# Patient Record
Sex: Female | Born: 1945 | Race: White | Hispanic: No | Smoking: Former smoker
Health system: Southern US, Community
[De-identification: ages and names within clinical notes are randomized; demographics above are authoritative.]

## PROBLEM LIST (undated history)

## (undated) DIAGNOSIS — E559 Vitamin D deficiency, unspecified: Secondary | ICD-10-CM

## (undated) DIAGNOSIS — H269 Unspecified cataract: Secondary | ICD-10-CM

## (undated) DIAGNOSIS — K219 Gastro-esophageal reflux disease without esophagitis: Secondary | ICD-10-CM

## (undated) DIAGNOSIS — T7840XA Allergy, unspecified, initial encounter: Secondary | ICD-10-CM

## (undated) DIAGNOSIS — M1990 Unspecified osteoarthritis, unspecified site: Secondary | ICD-10-CM

## (undated) DIAGNOSIS — D219 Benign neoplasm of connective and other soft tissue, unspecified: Secondary | ICD-10-CM

## (undated) DIAGNOSIS — H409 Unspecified glaucoma: Secondary | ICD-10-CM

## (undated) DIAGNOSIS — Z974 Presence of external hearing-aid: Secondary | ICD-10-CM

## (undated) DIAGNOSIS — F419 Anxiety disorder, unspecified: Secondary | ICD-10-CM

## (undated) DIAGNOSIS — E785 Hyperlipidemia, unspecified: Secondary | ICD-10-CM

## (undated) DIAGNOSIS — N6320 Unspecified lump in the left breast, unspecified quadrant: Secondary | ICD-10-CM

## (undated) HISTORY — DX: Presence of external hearing-aid: Z97.4

## (undated) HISTORY — DX: Hyperlipidemia, unspecified: E78.5

## (undated) HISTORY — DX: Gastro-esophageal reflux disease without esophagitis: K21.9

## (undated) HISTORY — DX: Anxiety disorder, unspecified: F41.9

## (undated) HISTORY — DX: Allergy, unspecified, initial encounter: T78.40XA

## (undated) HISTORY — DX: Vitamin D deficiency, unspecified: E55.9

## (undated) HISTORY — PX: ABDOMINAL HYSTERECTOMY: SHX81

## (undated) HISTORY — PX: TOTAL ABDOMINAL HYSTERECTOMY W/ BILATERAL SALPINGOOPHORECTOMY: SHX83

## (undated) HISTORY — PX: COSMETIC SURGERY: SHX468

## (undated) HISTORY — DX: Unspecified cataract: H26.9

## (undated) HISTORY — DX: Unspecified glaucoma: H40.9

## (undated) HISTORY — PX: TRIGGER FINGER RELEASE: SHX641

## (undated) HISTORY — DX: Unspecified lump in the left breast, unspecified quadrant: N63.20

## (undated) HISTORY — DX: Unspecified osteoarthritis, unspecified site: M19.90

## (undated) HISTORY — DX: Benign neoplasm of connective and other soft tissue, unspecified: D21.9

## (undated) HISTORY — PX: CATARACT EXTRACTION W/ INTRAOCULAR LENS IMPLANT: SHX1309

---

## 1970-10-04 HISTORY — PX: TONSILLECTOMY AND ADENOIDECTOMY: SUR1326

## 1985-10-04 DIAGNOSIS — D219 Benign neoplasm of connective and other soft tissue, unspecified: Secondary | ICD-10-CM

## 1985-10-04 HISTORY — DX: Benign neoplasm of connective and other soft tissue, unspecified: D21.9

## 1987-10-05 HISTORY — PX: TOTAL ABDOMINAL HYSTERECTOMY W/ BILATERAL SALPINGOOPHORECTOMY: SHX83

## 1995-10-05 HISTORY — PX: STOMACH SURGERY: SHX791

## 2003-10-05 DIAGNOSIS — N6320 Unspecified lump in the left breast, unspecified quadrant: Secondary | ICD-10-CM

## 2003-10-05 HISTORY — DX: Unspecified lump in the left breast, unspecified quadrant: N63.20

## 2003-11-05 ENCOUNTER — Encounter: Admission: RE | Admit: 2003-11-05 | Discharge: 2003-11-05 | Payer: Self-pay | Admitting: Obstetrics and Gynecology

## 2004-12-10 ENCOUNTER — Encounter: Admission: RE | Admit: 2004-12-10 | Discharge: 2004-12-10 | Payer: Self-pay | Admitting: *Deleted

## 2005-12-27 ENCOUNTER — Encounter: Admission: RE | Admit: 2005-12-27 | Discharge: 2005-12-27 | Payer: Self-pay | Admitting: Obstetrics and Gynecology

## 2006-02-15 ENCOUNTER — Ambulatory Visit: Payer: Self-pay | Admitting: Internal Medicine

## 2006-02-16 ENCOUNTER — Ambulatory Visit: Payer: Self-pay | Admitting: Internal Medicine

## 2006-05-19 ENCOUNTER — Emergency Department (HOSPITAL_COMMUNITY): Admission: EM | Admit: 2006-05-19 | Discharge: 2006-05-20 | Payer: Self-pay | Admitting: Emergency Medicine

## 2006-10-24 ENCOUNTER — Ambulatory Visit: Payer: Self-pay | Admitting: Internal Medicine

## 2006-12-29 ENCOUNTER — Encounter: Admission: RE | Admit: 2006-12-29 | Discharge: 2006-12-29 | Payer: Self-pay | Admitting: Obstetrics and Gynecology

## 2007-02-20 ENCOUNTER — Ambulatory Visit: Payer: Self-pay | Admitting: Internal Medicine

## 2007-07-24 ENCOUNTER — Telehealth: Payer: Self-pay | Admitting: Internal Medicine

## 2007-08-16 ENCOUNTER — Ambulatory Visit: Payer: Self-pay | Admitting: Internal Medicine

## 2007-08-16 DIAGNOSIS — H6120 Impacted cerumen, unspecified ear: Secondary | ICD-10-CM | POA: Insufficient documentation

## 2007-10-05 HISTORY — PX: LIPOMA EXCISION: SHX5283

## 2008-01-01 ENCOUNTER — Encounter: Admission: RE | Admit: 2008-01-01 | Discharge: 2008-01-01 | Payer: Self-pay | Admitting: Obstetrics & Gynecology

## 2008-04-16 ENCOUNTER — Telehealth: Payer: Self-pay | Admitting: Internal Medicine

## 2008-05-23 ENCOUNTER — Encounter: Payer: Self-pay | Admitting: Internal Medicine

## 2008-05-30 ENCOUNTER — Encounter: Admission: RE | Admit: 2008-05-30 | Discharge: 2008-05-30 | Payer: Self-pay | Admitting: Obstetrics & Gynecology

## 2008-10-24 ENCOUNTER — Telehealth: Payer: Self-pay | Admitting: Internal Medicine

## 2008-10-24 ENCOUNTER — Ambulatory Visit: Payer: Self-pay | Admitting: Internal Medicine

## 2008-10-24 DIAGNOSIS — F411 Generalized anxiety disorder: Secondary | ICD-10-CM | POA: Insufficient documentation

## 2008-10-24 DIAGNOSIS — E782 Mixed hyperlipidemia: Secondary | ICD-10-CM | POA: Insufficient documentation

## 2008-10-24 DIAGNOSIS — E785 Hyperlipidemia, unspecified: Secondary | ICD-10-CM | POA: Insufficient documentation

## 2008-10-24 LAB — CONVERTED CEMR LAB
Basophils Absolute: 0 10*3/uL (ref 0.0–0.1)
Bilirubin, Direct: 0.1 mg/dL (ref 0.0–0.3)
Calcium: 9.2 mg/dL (ref 8.4–10.5)
Cholesterol: 245 mg/dL (ref 0–200)
Eosinophils Absolute: 0.1 10*3/uL (ref 0.0–0.7)
GFR calc Af Amer: 93 mL/min
GFR calc non Af Amer: 77 mL/min
Glucose, Bld: 81 mg/dL (ref 70–99)
HCT: 38.6 % (ref 36.0–46.0)
Hemoglobin: 13.4 g/dL (ref 12.0–15.0)
MCHC: 34.8 g/dL (ref 30.0–36.0)
MCV: 92.9 fL (ref 78.0–100.0)
Monocytes Absolute: 0.3 10*3/uL (ref 0.1–1.0)
Monocytes Relative: 6.4 % (ref 3.0–12.0)
Neutro Abs: 2.6 10*3/uL (ref 1.4–7.7)
Platelets: 261 10*3/uL (ref 150–400)
Potassium: 4 meq/L (ref 3.5–5.1)
RDW: 11.8 % (ref 11.5–14.6)
Sodium: 145 meq/L (ref 135–145)
TSH: 2.11 microintl units/mL (ref 0.35–5.50)
Total Protein: 6.6 g/dL (ref 6.0–8.3)
Triglycerides: 100 mg/dL (ref 0–149)

## 2008-11-07 ENCOUNTER — Ambulatory Visit: Payer: Self-pay | Admitting: Internal Medicine

## 2008-12-17 ENCOUNTER — Ambulatory Visit: Payer: Self-pay | Admitting: Internal Medicine

## 2008-12-17 DIAGNOSIS — M25519 Pain in unspecified shoulder: Secondary | ICD-10-CM | POA: Insufficient documentation

## 2009-01-02 ENCOUNTER — Encounter: Admission: RE | Admit: 2009-01-02 | Discharge: 2009-01-02 | Payer: Self-pay | Admitting: Obstetrics & Gynecology

## 2009-04-14 ENCOUNTER — Encounter: Payer: Self-pay | Admitting: Internal Medicine

## 2009-06-04 ENCOUNTER — Ambulatory Visit: Payer: Self-pay | Admitting: Internal Medicine

## 2009-06-17 ENCOUNTER — Ambulatory Visit: Payer: Self-pay | Admitting: Internal Medicine

## 2010-01-05 ENCOUNTER — Encounter: Admission: RE | Admit: 2010-01-05 | Discharge: 2010-01-05 | Payer: Self-pay | Admitting: Obstetrics & Gynecology

## 2010-06-23 ENCOUNTER — Encounter: Admission: RE | Admit: 2010-06-23 | Discharge: 2010-06-23 | Payer: Self-pay | Admitting: Obstetrics & Gynecology

## 2010-07-13 ENCOUNTER — Encounter: Admission: RE | Admit: 2010-07-13 | Discharge: 2010-07-13 | Payer: Self-pay | Admitting: Internal Medicine

## 2011-03-04 ENCOUNTER — Other Ambulatory Visit: Payer: Self-pay | Admitting: Obstetrics & Gynecology

## 2011-03-04 DIAGNOSIS — Z1231 Encounter for screening mammogram for malignant neoplasm of breast: Secondary | ICD-10-CM

## 2011-03-08 ENCOUNTER — Ambulatory Visit
Admission: RE | Admit: 2011-03-08 | Discharge: 2011-03-08 | Disposition: A | Discharge status: Home / Self Care | Payer: Medicare Other | Source: Ambulatory Visit | Attending: Obstetrics & Gynecology | Admitting: Obstetrics & Gynecology

## 2011-03-08 DIAGNOSIS — Z1231 Encounter for screening mammogram for malignant neoplasm of breast: Secondary | ICD-10-CM

## 2012-03-03 ENCOUNTER — Other Ambulatory Visit: Payer: Self-pay | Admitting: Obstetrics & Gynecology

## 2012-03-03 DIAGNOSIS — Z1231 Encounter for screening mammogram for malignant neoplasm of breast: Secondary | ICD-10-CM

## 2012-03-13 ENCOUNTER — Ambulatory Visit
Admission: RE | Admit: 2012-03-13 | Discharge: 2012-03-13 | Disposition: A | Discharge status: Home / Self Care | Payer: Medicare Other | Source: Ambulatory Visit | Attending: Obstetrics & Gynecology | Admitting: Obstetrics & Gynecology

## 2012-03-13 DIAGNOSIS — Z1231 Encounter for screening mammogram for malignant neoplasm of breast: Secondary | ICD-10-CM

## 2012-06-04 HISTORY — PX: EYE SURGERY: SHX253

## 2013-02-14 ENCOUNTER — Other Ambulatory Visit: Payer: Self-pay

## 2013-02-14 DIAGNOSIS — Z1231 Encounter for screening mammogram for malignant neoplasm of breast: Secondary | ICD-10-CM

## 2013-03-20 ENCOUNTER — Ambulatory Visit
Admission: RE | Admit: 2013-03-20 | Discharge: 2013-03-20 | Disposition: A | Discharge status: Home / Self Care | Payer: Medicare Other | Source: Ambulatory Visit

## 2013-03-20 DIAGNOSIS — Z1231 Encounter for screening mammogram for malignant neoplasm of breast: Secondary | ICD-10-CM

## 2013-06-29 ENCOUNTER — Encounter: Payer: Self-pay | Admitting: Obstetrics & Gynecology

## 2013-11-28 ENCOUNTER — Encounter: Payer: Self-pay | Admitting: Obstetrics & Gynecology

## 2013-11-30 ENCOUNTER — Ambulatory Visit: Payer: Self-pay | Admitting: Obstetrics & Gynecology

## 2013-12-07 ENCOUNTER — Ambulatory Visit: Payer: Self-pay | Admitting: Obstetrics & Gynecology

## 2013-12-20 ENCOUNTER — Encounter: Payer: Self-pay | Admitting: Obstetrics & Gynecology

## 2013-12-20 ENCOUNTER — Ambulatory Visit (INDEPENDENT_AMBULATORY_CARE_PROVIDER_SITE_OTHER): Payer: Medicare Other | Admitting: Obstetrics & Gynecology

## 2013-12-20 ENCOUNTER — Other Ambulatory Visit: Payer: Self-pay | Admitting: Obstetrics & Gynecology

## 2013-12-20 VITALS — BP 140/72 | HR 80 | Resp 16 | Ht 64.5 in | Wt 145.8 lb

## 2013-12-20 DIAGNOSIS — Z1239 Encounter for other screening for malignant neoplasm of breast: Secondary | ICD-10-CM

## 2013-12-20 DIAGNOSIS — Z01419 Encounter for gynecological examination (general) (routine) without abnormal findings: Secondary | ICD-10-CM

## 2013-12-20 DIAGNOSIS — E2839 Other primary ovarian failure: Secondary | ICD-10-CM

## 2013-12-20 DIAGNOSIS — Z1231 Encounter for screening mammogram for malignant neoplasm of breast: Secondary | ICD-10-CM

## 2013-12-20 MED ORDER — METOPROLOL SUCCINATE ER 25 MG PO TB24: ORAL_TABLET | ORAL | Status: DC

## 2013-12-20 MED ORDER — PAROXETINE HCL 40 MG PO TABS: 40.0000 mg | ORAL_TABLET | Freq: Every day | ORAL | Status: DC

## 2013-12-20 NOTE — Progress Notes (Signed)
68 y.o. G0P0000 DivorcedCaucasianF here for annual exam.  No vaginal bleeding.  Having a little issues with fullness in her ears.  Going to try some Zyrtec first.  She thinks it is allergy related.  Was started on Metoprolol for increased rate.  Felt really good on it but was afraid this was something bad so she stopped it and didn't call Dr. Baird Cancer to let her know.  Will need RFs.  I recommend she restart this.  Patient's last menstrual period was 10/04/1984.          Sexually active: no  The current method of family planning is status post hysterectomy.    Exercising: yes  walking Smoker:  Former smoker-quit in [place] Maintenance: Pap:  ? 2004 History of abnormal Pap:  no MMG:  03/20/13 normal Colonoscopy:  2010 repeat in 5 years BMD:   2009 TDaP:  Pt thinks UTD with Dr Glendale Chard Screening Labs: PCP, Hb today: PCP, Urine today: PCP   reports that she has quit smoking. She has never used smokeless tobacco. She reports that she does not drink alcohol or use illicit drugs.  Past Medical History  Diagnosis Date  . Breast mass, left 2005    fatty on U/S  . Anxiety     Chronic  . Arthritis   . Fibroid 1987    TAH/USO  . Hyperlipidemia     Past Surgical History  Procedure Laterality Date  . Tonsillectomy and adenoidectomy  1972  . Abdominal hysterectomy  1987    TAH/USO Fibroids, Cyst  . Cosmetic surgery      Mini face lift  . Stomach surgery  1997    Sphincter  . Lipoma excision Left 2009    left arm  . Eye surgery  06/2012    eye lift  . Trigger finger release      right    Current Outpatient Prescriptions  Medication Sig Dispense Refill  . atorvastatin (LIPITOR) 20 MG tablet       . BIOTIN PO Take 1,000 mg by mouth.      . Cholecalciferol (VITAMIN D PO) Take 4,000 Int'l Units by mouth daily.      Marland Kitchen latanoprost (XALATAN) 0.005 % ophthalmic solution       . PARoxetine (PAXIL) 40 MG tablet Take 40 mg by mouth daily.      . metoprolol succinate (TOPROL-XL) 25  MG 24 hr tablet 25 mg. 1/2 twice daily       No current facility-administered medications for this visit.    Family History  Problem Relation Age of Onset  . Diabetes Mother   . Prostate cancer Father   . Cancer Father     colon cancer  . Diabetes Brother   . Prostate cancer Brother     ROS:  Pertinent items are noted in HPI.  Otherwise, a comprehensive ROS was negative.  Exam:   BP 140/72  Pulse 80  Resp 16  Ht 5' 4.5" (1.638 m)  Wt 145 lb 12.8 oz (66.134 kg)  BMI 24.65 kg/m2  LMP 10/04/1984    Height: 5' 4.5" (163.8 cm)  Ht Readings from Last 3 Encounters:  12/20/13 5' 4.5" (1.638 m)    General appearance: alert, cooperative and appears stated age Head: Normocephalic, without obvious abnormality, atraumatic Neck: no adenopathy, supple, symmetrical, trachea midline and thyroid normal to inspection and palpation Lungs: clear to auscultation bilaterally Breasts: normal appearance, no masses or tenderness Heart: regular rate and rhythm Abdomen: soft, non-tender;  bowel sounds normal; no masses,  no organomegaly Extremities: extremities normal, atraumatic, no cyanosis or edema Skin: Skin color, texture, turgor normal. No rashes or lesions Lymph nodes: Cervical, supraclavicular, and axillary nodes normal. No abnormal inguinal nodes palpated Neurologic: Grossly normal   Pelvic: External genitalia:  no lesions              Urethra:  normal appearing urethra with no masses, tenderness or lesions              Bartholins and Skenes: normal                 Vagina: normal appearing vagina with normal color and discharge, no lesions              Cervix: absent              Pap taken: no Bimanual Exam:  Uterus:  uterus absent              Adnexa: no mass, fullness, tenderness               Rectovaginal: Confirms               Anus:  normal sphincter tone, no lesions  A:  Well Woman with normal exam PMP, no HRT Remote hx of alcohol abuse H/O TAH/USO Anxiety Elevated  pulse Elevated lipids  P:   Mammogram yearly.  Will plan BMD with this in June. pap smear not indicated. Rx for Metoprolol 25mg  XL, 1/2 tab BID #30/13RF. Rx for Paxil 40mg  daily.  #30/12 RF.  Pt will call if wants to go back down to 20mg . Sees Dr. Mena Goes 4-6 months.  Labs done last visit.  Blood work good per pt. return annually or prn  An After Visit Summary was printed and given to the patient.

## 2013-12-20 NOTE — Patient Instructions (Signed)

## 2014-03-15 ENCOUNTER — Encounter: Payer: Self-pay | Admitting: Podiatrist

## 2014-03-15 ENCOUNTER — Ambulatory Visit (INDEPENDENT_AMBULATORY_CARE_PROVIDER_SITE_OTHER): Payer: Medicare Other | Admitting: Podiatrist

## 2014-03-15 ENCOUNTER — Ambulatory Visit (INDEPENDENT_AMBULATORY_CARE_PROVIDER_SITE_OTHER): Payer: Medicare Other

## 2014-03-15 VITALS — BP 144/84 | HR 77 | Resp 18

## 2014-03-15 DIAGNOSIS — M722 Plantar fascial fibromatosis: Secondary | ICD-10-CM

## 2014-03-15 DIAGNOSIS — M7660 Achilles tendinitis, unspecified leg: Secondary | ICD-10-CM

## 2014-03-15 MED ORDER — DICLOFENAC SODIUM 1 % TD GEL: 2.0000 g | Freq: Four times a day (QID) | TRANSDERMAL | Status: DC

## 2014-03-15 NOTE — Progress Notes (Signed)
   Subjective:    Patient ID: Mary Krueger, female    DOB: 22-Aug-1946, 68 y.o.   MRN: [id number]  HPI my right heel has been going on for about 2 months and is sore and tender and hurts on the bottom and real painful going up steps and at night    Review of Systems  All other systems reviewed and are negative.      Objective:   Physical Exam  Patient is awake, alert, and oriented x 3.  In no acute distress.  Vascular status is intact with palpable pedal pulses at 2/4 DP and PT bilateral and capillary refill time within normal limits. Neurological sensation is also intact bilaterally via Semmes Weinstein monofilament at 5/5 sites. Light touch, vibratory sensation, Achilles tendon reflex is intact. Dermatological exam reveals skin color, turger and texture as normal. No open lesions present.  Musculature intact with dorsiflexion, plantarflexion, inversion, eversion. Rectus foot type is seen.  No pain along the plantar fascia of the right heel is noted. Generalized pain bilateral feet is present. A very pinpoint area of pain is present on the posterior lateral aspect of the right heel at the lateral slip of the Achilles tendon insertion. Range of motion is within normal limits with knee extended in the flexed at the ankle joint.    Assessment & Plan:  Achilles tendinitis, generalized tendinitis or myalgia bilateral feet  Plan: Discussed anti-inflammatory therapy with Voltaren gel. This was called into her pharmacy. Discussed injection therapy and she declined. Discussed her correlation of her foot pain in her recent statin drugs and encouraged her to speak to her primary care physician regarding this relationship. She'll be seen back if there is no improvement with stretching, Voltaren gel, and activity changes. Also recommended she wear shoes at all times at home she tends to go barefoot on her title and hard wood floors.

## 2014-03-15 NOTE — Patient Instructions (Signed)

## 2014-03-22 ENCOUNTER — Ambulatory Visit
Admission: RE | Admit: 2014-03-22 | Discharge: 2014-03-22 | Disposition: A | Discharge status: Home / Self Care | Payer: Medicare Other | Source: Ambulatory Visit | Attending: Obstetrics & Gynecology | Admitting: Obstetrics & Gynecology

## 2014-03-22 DIAGNOSIS — Z1231 Encounter for screening mammogram for malignant neoplasm of breast: Secondary | ICD-10-CM

## 2014-03-22 DIAGNOSIS — E2839 Other primary ovarian failure: Secondary | ICD-10-CM

## 2014-03-28 ENCOUNTER — Telehealth: Payer: Self-pay

## 2014-03-28 NOTE — Telephone Encounter (Signed)
Patient notified of results.//kn 

## 2014-03-28 NOTE — Telephone Encounter (Signed)
Lmtcb//kn 

## 2014-03-28 NOTE — Telephone Encounter (Signed)
Message copied by Robley Fries on Thu Mar 28, 2014 10:01 AM ------      Message from: Megan Salon      Created: Wed Mar 27, 2014  9:56 AM       Please inform pt BMD looks really good.  It is normal.  Stable from prior exam.  May repeat 5 years.  She doesn't need to do anything else. ------

## 2014-04-02 ENCOUNTER — Encounter: Payer: Self-pay | Admitting: *Deleted

## 2014-04-22 ENCOUNTER — Encounter: Payer: Self-pay | Admitting: Internal Medicine

## 2014-12-24 ENCOUNTER — Other Ambulatory Visit: Payer: Self-pay | Admitting: Obstetrics & Gynecology

## 2014-12-24 NOTE — Telephone Encounter (Signed)
Medication refill request: Paxil 40 mg  Last AEX:  12/20/13 with SM Next AEX: 12/26/14 with SM  Last MMG (if hormonal medication request): N/A Refill authorized: #30/0 rfs, please advise.

## 2014-12-26 ENCOUNTER — Ambulatory Visit: Payer: Medicare Other | Admitting: Obstetrics & Gynecology

## 2015-01-05 ENCOUNTER — Encounter: Payer: Self-pay | Admitting: Internal Medicine

## 2015-03-01 ENCOUNTER — Other Ambulatory Visit: Payer: Self-pay | Admitting: Obstetrics & Gynecology

## 2015-03-04 NOTE — Telephone Encounter (Signed)
Medication refill request: Paxil 40 mg Last AEX:  12/20/13 SM Next AEX: 03/11/15 SM Last MMG (if hormonal medication request): 03/25/14 BIRADS1:neg Refill authorized: 12/24/14 #30 tabs w/ 0 R. Today please advise

## 2015-03-11 ENCOUNTER — Ambulatory Visit (INDEPENDENT_AMBULATORY_CARE_PROVIDER_SITE_OTHER): Payer: Medicare Other | Admitting: Obstetrics & Gynecology

## 2015-03-11 ENCOUNTER — Encounter: Payer: Self-pay | Admitting: Obstetrics & Gynecology

## 2015-03-11 VITALS — BP 128/62 | HR 64 | Resp 16 | Ht 64.25 in | Wt 142.2 lb

## 2015-03-11 DIAGNOSIS — Z01419 Encounter for gynecological examination (general) (routine) without abnormal findings: Secondary | ICD-10-CM

## 2015-03-11 MED ORDER — PAROXETINE HCL 40 MG PO TABS: 40.0000 mg | ORAL_TABLET | Freq: Every day | ORAL | Status: DC

## 2015-03-11 MED ORDER — CELECOXIB 200 MG PO CAPS: 200.0000 mg | ORAL_CAPSULE | Freq: Every day | ORAL | Status: DC

## 2015-03-11 NOTE — Progress Notes (Signed)
69 y.o. G0P0000 DivorcedCaucasianF here for annual exam.  Doing well.  No vaginal bleeding.  Had some issues with plantar fasciitis last year.  Did see specialist at [facility].  Reports she is being told she snores a lot more loudly.  Considering a specialist.    Having more joint issues.  Has used Celebrex in the past.  Would like a RF.  Would like to see ADD specialist.  Has had issues for years.  Would like suggestions.    PCP:  Dr. Baird Cancer.  Cholesterol down to 216.  Has lost weight as well.  All other labs were good.    Patient's last menstrual period was 10/04/1984.          Sexually active: No.  The current method of family planning is status post hysterectomy.    Exercising: Yes.     Smoker:  Former smoker  Health Maintenance: Pap: 2004? History of abnormal Pap:  no MMG:  03/25/14 BIRADS1:neg Colonoscopy:  06/2009 Normal  - repeat 5 years BMD:   03/2014 TDaP:  UTD Screening Labs: PCP, Hb today: PCP, Urine today: PCP   reports that she has quit smoking. She has never used smokeless tobacco. She reports that she does not drink alcohol or use illicit drugs.  Past Medical History  Diagnosis Date  . Breast mass, left 2005    fatty on U/S  . Anxiety     Chronic  . Arthritis   . Fibroid 1987    TAH/USO  . Hyperlipidemia     Past Surgical History  Procedure Laterality Date  . Tonsillectomy and adenoidectomy  1972  . Abdominal hysterectomy  1987    TAH/USO Fibroids, Cyst  . Cosmetic surgery      Mini face lift  . Stomach surgery  1997    Sphincter  . Lipoma excision Left 2009    left arm  . Eye surgery  06/2012    eye lift  . Trigger finger release      right    Current Outpatient Prescriptions  Medication Sig Dispense Refill  . atorvastatin (LIPITOR) 20 MG tablet     . BIOTIN PO Take 1,000 mg by mouth.    . Cholecalciferol (VITAMIN D PO) Take 4,000 Int'l Units by mouth daily.    . diclofenac sodium (VOLTAREN) 1 % GEL Apply 2 g topically 4 (four) times daily.  Rub into affected area of foot 2 to 4 times daily 100 g 2  . latanoprost (XALATAN) 0.005 % ophthalmic solution     . metoprolol succinate (TOPROL-XL) 25 MG 24 hr tablet 1/2 tablet twice daily 30 tablet 13  . PARoxetine (PAXIL) 40 MG tablet TAKE ONE TABLET BY MOUTH ONE TIME DAILY 30 tablet 0   No current facility-administered medications for this visit.    Family History  Problem Relation Age of Onset  . Diabetes Mother   . Prostate cancer Father   . Colon cancer Father   . Diabetes Brother   . Prostate cancer Brother     ROS:  Pertinent items are noted in HPI.  Otherwise, a comprehensive ROS was negative.  Exam:   General appearance: alert, cooperative and appears stated age Head: Normocephalic, without obvious abnormality, atraumatic Neck: no adenopathy, supple, symmetrical, trachea midline and thyroid normal to inspection and palpation Lungs: clear to auscultation bilaterally Breasts: normal appearance, no masses or tenderness Heart: regular rate and rhythm Abdomen: soft, non-tender; bowel sounds normal; no masses,  no organomegaly Extremities: extremities normal, atraumatic, no  cyanosis or edema Skin: Skin color, texture, turgor normal. No rashes or lesions Lymph nodes: Cervical, supraclavicular, and axillary nodes normal. No abnormal inguinal nodes palpated Neurologic: Grossly normal   Pelvic: External genitalia:  no lesions              Urethra:  normal appearing urethra with no masses, tenderness or lesions              Bartholins and Skenes: normal                 Vagina: normal appearing vagina with normal color and discharge, no lesions              Cervix: absent              Pap taken: No. Bimanual Exam:  Uterus:  uterus absent              Adnexa: no mass, fullness, tenderness               Rectovaginal: Confirms               Anus:  normal sphincter tone, no lesions  Chaperone was present for exam.  A:  Well Woman with normal exam PMP, no HRT Remote hx  of alcohol abuse Arthritis H/O TAH/USO Anxiety Elevated lipids Increased snoring  P: Mammogram yearly. pap smear not indicated. Rx for Paxil 40mg  daily. #90/4 Sees Dr. Mena Goes 4-6 months. Labs done last visit. Blood work good per pt. Celebrex 200mg  daily, prn.  #30/4RF Considering ENT referral Amy Stevenson's name given.  She will call if needs referral. return annually or prn

## 2015-03-11 NOTE — Patient Instructions (Signed)
[facility] Location [facility]  [phone] [address], [place], [place] [address] Phone: [phone] Fax: [phone] Dr. Rachel Moulds

## 2015-05-15 ENCOUNTER — Other Ambulatory Visit: Payer: Self-pay

## 2015-05-15 DIAGNOSIS — Z1231 Encounter for screening mammogram for malignant neoplasm of breast: Secondary | ICD-10-CM

## 2015-06-26 ENCOUNTER — Ambulatory Visit
Admission: RE | Admit: 2015-06-26 | Discharge: 2015-06-26 | Disposition: A | Discharge status: Home / Self Care | Payer: Medicare Other | Source: Ambulatory Visit

## 2015-06-26 DIAGNOSIS — Z1231 Encounter for screening mammogram for malignant neoplasm of breast: Secondary | ICD-10-CM

## 2015-10-03 ENCOUNTER — Encounter: Payer: Self-pay | Admitting: Gastroenterology

## 2016-06-04 ENCOUNTER — Ambulatory Visit: Payer: Medicare Other | Admitting: Obstetrics & Gynecology

## 2016-06-15 ENCOUNTER — Encounter: Payer: Self-pay | Admitting: Obstetrics & Gynecology

## 2016-06-15 ENCOUNTER — Ambulatory Visit (INDEPENDENT_AMBULATORY_CARE_PROVIDER_SITE_OTHER): Payer: Medicare Other | Admitting: Obstetrics & Gynecology

## 2016-06-15 VITALS — BP 156/86 | HR 84 | Resp 16 | Ht 64.0 in | Wt 147.0 lb

## 2016-06-15 DIAGNOSIS — Z0000 Encounter for general adult medical examination without abnormal findings: Secondary | ICD-10-CM | POA: Diagnosis not present

## 2016-06-15 DIAGNOSIS — R0683 Snoring: Secondary | ICD-10-CM

## 2016-06-15 DIAGNOSIS — Z01419 Encounter for gynecological examination (general) (routine) without abnormal findings: Secondary | ICD-10-CM

## 2016-06-15 LAB — POCT URINALYSIS DIPSTICK
Bilirubin, UA: NEGATIVE
Glucose, UA: NEGATIVE
Ketones, UA: NEGATIVE
LEUKOCYTES UA: NEGATIVE
NITRITE UA: NEGATIVE
PH UA: 5
Protein, UA: NEGATIVE
RBC UA: NEGATIVE
Urobilinogen, UA: NEGATIVE

## 2016-06-15 MED ORDER — PAROXETINE HCL 40 MG PO TABS: 40.0000 mg | ORAL_TABLET | Freq: Every day | ORAL | 4 refills | Status: DC

## 2016-06-15 NOTE — Progress Notes (Signed)
70 y.o. G0P0000 DivorcedCaucasianF here for annual exam.  Doing well.  Her BP is mildly elevated.  Pt is going to [place] and [place] in two weeks.  She will be traveling with a friend.   Reports she has been having some headaches.  Advised BP is elevated today and this may be related.  Pt would like to see her PCP if possible before she leaves for her trip.    Reports having a nephew who died due to a drug overdose, heroine.  He lived in [place].  He did have children.  Pt is very sad about this.  She would like to consider something different to depression.    Did see Dr. Gerilyn Nestle last year.  Did do testing for attention.  Reports this was normal.    Did think about seeing ENT last year due to significant snoring.  Does not think she has sleep apnea.    Denies vaginal bleeding.    Patient's last menstrual period was 10/04/1984.          Sexually active: No.  The current method of family planning is status post hysterectomy.    Exercising: Yes.    Walking Smoker:  no  Health Maintenance: Pap:  ~2004. Normal   History of abnormal Pap:  no MMG:  06/27/15 BIRADS1:neg  Colonoscopy:  09/10/09 Polyps   BMD:   03/22/14 Normal  TDaP:  Unsure Pneumonia vaccine(s):  Completed Zostavax:   No Hep C testing: No Screening Labs: PCP, Urine today: PCP   reports that she has quit smoking. She has never used smokeless tobacco. She reports that she does not drink alcohol or use drugs.  Past Medical History:  Diagnosis Date  . Anxiety    Chronic  . Arthritis   . Breast mass, left 2005   fatty on U/S  . Cataract   . Fibroid 1987   TAH/USO  . Glaucoma   . Hyperlipidemia     Past Surgical History:  Procedure Laterality Date  . ABDOMINAL HYSTERECTOMY  1987   TAH/USO Fibroids, Cyst  . COSMETIC SURGERY     Mini face lift  . EYE SURGERY  06/2012   eye lift  . LIPOMA EXCISION Left 2009   left arm  . STOMACH SURGERY  1997   Sphincter  . TONSILLECTOMY AND ADENOIDECTOMY  1972  . TRIGGER FINGER  RELEASE     right    Current Outpatient Prescriptions  Medication Sig Dispense Refill  . BIOTIN PO Take 1,000 mg by mouth.    . Cholecalciferol (VITAMIN D PO) Take 4,000 Int'l Units by mouth daily.    . dorzolamide (TRUSOPT) 2 % ophthalmic solution     . metoprolol succinate (TOPROL-XL) 25 MG 24 hr tablet 1/2 tablet twice daily 30 tablet 13  . PARoxetine (PAXIL) 40 MG tablet Take 1 tablet (40 mg total) by mouth daily. 90 tablet 4  . ZETIA 10 MG tablet Take 10 mg by mouth daily.  5   No current facility-administered medications for this visit.     Family History  Problem Relation Age of Onset  . Diabetes Mother   . Prostate cancer Father   . Colon cancer Father   . Diabetes Brother   . Prostate cancer Brother     ROS:  Pertinent items are noted in HPI.  Otherwise, a comprehensive ROS was negative.  Exam:   BP (!) 156/86 (BP Location: Right Arm, Patient Position: Sitting, Cuff Size: Normal)   Pulse 84   Resp  16   Ht 5\' 4"  (1.626 m)   Wt 147 lb (66.7 kg)   LMP 10/04/1984   BMI 25.23 kg/m   Weight change: +5#   Height: 5\' 4"  (162.6 cm)  Ht Readings from Last 3 Encounters:  06/15/16 5\' 4"  (1.626 m)  03/11/15 5' 4.25" (1.632 m)  12/20/13 5' 4.5" (1.638 m)    General appearance: alert, cooperative and appears stated age Head: Normocephalic, without obvious abnormality, atraumatic Neck: no adenopathy, supple, symmetrical, trachea midline and thyroid normal to inspection and palpation Lungs: clear to auscultation bilaterally Breasts: normal appearance, no masses or tenderness Heart: regular rate and rhythm Abdomen: soft, non-tender; bowel sounds normal; no masses,  no organomegaly Extremities: extremities normal, atraumatic, no cyanosis or edema Skin: Skin color, texture, turgor normal. No rashes or lesions Lymph nodes: Cervical, supraclavicular, and axillary nodes normal. No abnormal inguinal nodes palpated Neurologic: Grossly normal   Pelvic: External genitalia:  no  lesions              Urethra:  normal appearing urethra with no masses, tenderness or lesions              Bartholins and Skenes: normal                 Vagina: normal appearing vagina with normal color and discharge, no lesions              Cervix: absent              Pap taken: No. Bimanual Exam:  Uterus:  uterus absent              Adnexa: normal adnexa               Rectovaginal: Confirms               Anus:  normal sphincter tone, no lesions  Chaperone was present for exam.  A:  Well Woman with normal exam PMP, no HRT Remote hx of alcohol abuse H/o PTSD and depression, worse with recent death of nephew Arthritis H/O TAH/USO Anxiety Elevated lipids Increased snoring   P: Mammogram yearly pap smear not indicated. Rx for Paxil 40mg  daily. #90/4.  When pt comes back from [place] trip, she will call and I will change/adjust medication. Sees Dr. Baird Cancer regularly.  Does all blood work with her. ENT referral made Return annually or prn

## 2016-06-16 DIAGNOSIS — R030 Elevated blood-pressure reading, without diagnosis of hypertension: Secondary | ICD-10-CM | POA: Diagnosis not present

## 2016-07-12 DIAGNOSIS — R0683 Snoring: Secondary | ICD-10-CM | POA: Insufficient documentation

## 2016-07-21 ENCOUNTER — Telehealth: Payer: Self-pay | Admitting: Obstetrics & Gynecology

## 2016-07-21 NOTE — Telephone Encounter (Signed)
Medication refill request: PARoxetine 40mg  Last AEX:  06/15/16 SM Next AEX: 09/16/17 Last MMG (if hormonal medication request): 06/26/15 BIRADS1 negative Refill authorized: 06/15/16 #90 w/4 refills; today

## 2016-08-11 NOTE — Telephone Encounter (Signed)
Left message to call Mary Krueger at [phone].  

## 2016-08-11 NOTE — Telephone Encounter (Signed)
Patient states Dr. Sabra Heck was going to recommend a therapist for her. She wants to know if she will have to come in again to see her or is this something she can get over the phone.

## 2016-08-11 NOTE — Telephone Encounter (Signed)
Patient returning your call.

## 2016-08-11 NOTE — Telephone Encounter (Signed)
Left message to call Samai Corea at [phone].  

## 2016-08-11 NOTE — Telephone Encounter (Signed)
Spoke with patient. Patient calling for recommendation for therapist. Provided patient with Marya Amsler [phone] and Dr. Dorothey Baseman [phone]. Advised no referral needed. Patient to call and schedule. Patient verbalizes understanding and is agreeable.  Routing to provider for final review. Patient is agreeable to disposition. Will close encounter.

## 2016-08-17 ENCOUNTER — Other Ambulatory Visit: Payer: Self-pay | Admitting: Internal Medicine

## 2016-08-17 DIAGNOSIS — Z1231 Encounter for screening mammogram for malignant neoplasm of breast: Secondary | ICD-10-CM

## 2016-08-18 ENCOUNTER — Ambulatory Visit
Admission: RE | Admit: 2016-08-18 | Discharge: 2016-08-18 | Disposition: A | Discharge status: Home / Self Care | Payer: Medicare Other | Source: Ambulatory Visit | Attending: Internal Medicine | Admitting: Internal Medicine

## 2016-08-18 DIAGNOSIS — Z1231 Encounter for screening mammogram for malignant neoplasm of breast: Secondary | ICD-10-CM | POA: Diagnosis not present

## 2016-09-21 DIAGNOSIS — R002 Palpitations: Secondary | ICD-10-CM | POA: Diagnosis not present

## 2016-09-21 DIAGNOSIS — Z0000 Encounter for general adult medical examination without abnormal findings: Secondary | ICD-10-CM | POA: Diagnosis not present

## 2016-09-21 DIAGNOSIS — E784 Other hyperlipidemia: Secondary | ICD-10-CM | POA: Diagnosis not present

## 2016-09-21 DIAGNOSIS — E559 Vitamin D deficiency, unspecified: Secondary | ICD-10-CM | POA: Diagnosis not present

## 2016-09-21 DIAGNOSIS — H9311 Tinnitus, right ear: Secondary | ICD-10-CM | POA: Diagnosis not present

## 2016-09-29 ENCOUNTER — Telehealth: Payer: Self-pay

## 2016-09-29 NOTE — Telephone Encounter (Signed)
SENT NOTES TO SCHEDULING 

## 2016-10-06 ENCOUNTER — Telehealth: Payer: Self-pay | Admitting: Cardiology

## 2016-10-06 NOTE — Telephone Encounter (Signed)
Received records from [facility] for appointment on 10/22/16 with Dr Percival Spanish.  Records put with Dr Hochrein's schedule for 10/22/16. lp

## 2016-10-22 ENCOUNTER — Ambulatory Visit (INDEPENDENT_AMBULATORY_CARE_PROVIDER_SITE_OTHER): Payer: Medicare Other | Admitting: Cardiology

## 2016-10-22 ENCOUNTER — Encounter: Payer: Self-pay | Admitting: Cardiology

## 2016-10-22 VITALS — BP 122/72 | HR 78 | Ht 64.0 in | Wt 148.0 lb

## 2016-10-22 DIAGNOSIS — R9431 Abnormal electrocardiogram [ECG] [EKG]: Secondary | ICD-10-CM

## 2016-10-22 DIAGNOSIS — E785 Hyperlipidemia, unspecified: Secondary | ICD-10-CM | POA: Diagnosis not present

## 2016-10-22 NOTE — Patient Instructions (Signed)
Medication Instructions:  Continue current medications  Labwork: None Ordered  Testing/Procedures: None Ordered  Follow-Up: Your physician recommends that you schedule a follow-up appointment in: As Needed   Any Other Special Instructions Will Be Listed Below (If Applicable).   If you need a refill on your cardiac medications before your next appointment, please call your pharmacy.   

## 2016-10-22 NOTE — Progress Notes (Signed)
Cardiology Office Note   Date:  10/22/2016   ID:  Mary Krueger, DOB Jun 10, 1946, MRN [id number]  PCP:  Maximino Greenland, MD  Cardiologist:   Minus Breeding, MD  Referring:  Maximino Greenland, MD  Chief Complaint  Patient presents with  . New Patient (Initial Visit)    Pt states no Sx.       History of Present Illness: Mary Krueger is a 71 y.o. female who presents for evaluation of rapid heart rate. She's had this for some time and has been on low dose beta blocker. She says she really doesn't feel this.  She says she has always been high strung.  However, she doesn't have any cardiac complaints. I do see from old records that she had a heart rate of 113 on EKG a few years ago.   She is on her feet all day working in [employer]. She can walk for exercise.  The patient denies any new symptoms such as chest discomfort, neck or arm discomfort. There has been no new shortness of breath, PND or orthopnea. There have been no reported palpitations, presyncope or syncope.  Past Medical History:  Diagnosis Date  . Anxiety    Chronic  . Breast mass, left 2005   fatty on U/S  . Cataract   . Fibroid 1987   TAH/USO  . Glaucoma   . Hyperlipidemia   . Uses hearing aid    Bilateral   . Vitamin D deficiency     Past Surgical History:  Procedure Laterality Date  . CATARACT EXTRACTION W/ INTRAOCULAR LENS IMPLANT Bilateral 12/2015 01/2016  . COSMETIC SURGERY     Mini face lift  . EYE SURGERY  06/2012   eye lift  . LIPOMA EXCISION Left 2009   left arm  . STOMACH SURGERY  1997   Sphincter  . TONSILLECTOMY AND ADENOIDECTOMY  1972  . TOTAL ABDOMINAL HYSTERECTOMY W/ BILATERAL SALPINGOOPHORECTOMY    . TRIGGER FINGER RELEASE     right     Current Outpatient Prescriptions  Medication Sig Dispense Refill  . BIOTIN PO Take 1,000 mg by mouth.    . Cholecalciferol (VITAMIN D PO) Take 4,000 Int'l Units by mouth daily.    . dorzolamide (TRUSOPT) 2 % ophthalmic solution     . metoprolol succinate  (TOPROL-XL) 25 MG 24 hr tablet 1/2 tablet twice daily 30 tablet 13  . PARoxetine (PAXIL) 40 MG tablet Take 1 tablet (40 mg total) by mouth daily. 90 tablet 4  . timolol (TIMOPTIC) 0.25 % ophthalmic solution Place 1 drop into both eyes 2 (two) times daily.  3  . ZETIA 10 MG tablet Take 10 mg by mouth daily.  5   No current facility-administered medications for this visit.     Allergies:   Atorvastatin and Rosuvastatin    Social History:  The patient  reports that she has quit smoking. She has never used smokeless tobacco. She reports that she does not drink alcohol or use drugs.   Family History:  The patient's family history includes Colon cancer in her father; Diabetes in her brother and mother; Prostate cancer in her brother and father.    ROS:  Please see the history of present illness.   Otherwise, review of systems are positive for none.   All other systems are reviewed and negative.    PHYSICAL EXAM: VS:  BP 122/72   Pulse 78   Ht 5\' 4"  (1.626 m)   Wt 148  lb (67.1 kg)   LMP 10/04/1984   BMI 25.40 kg/m  , BMI Body mass index is 25.4 kg/m. GENERAL:  Well appearing HEENT:  Pupils equal round and reactive, fundi not visualized, oral mucosa unremarkable NECK:  No jugular venous distention, waveform within normal limits, carotid upstroke brisk and symmetric, no bruits, no thyromegaly LYMPHATICS:  No cervical, inguinal adenopathy LUNGS:  Clear to auscultation bilaterally BACK:  No CVA tenderness CHEST:  Unremarkable HEART:  PMI not displaced or sustained,S1 and S2 within normal limits, no S3, no S4, no clicks, no rubs, no murmurs ABD:  Flat, positive bowel sounds normal in frequency in pitch, no bruits, no rebound, no guarding, no midline pulsatile mass, no hepatomegaly, no splenomegaly EXT:  2 plus pulses throughout, no edema, no cyanosis no clubbing SKIN:  No rashes no nodules NEURO:  Cranial nerves II through XII grossly intact, motor grossly intact throughout PSYCH:   Cognitively intact, oriented to person place and time    EKG:  EKG is ordered today. The ekg ordered today demonstrates  sinus rhythm, poor anterior R wave progression, no acute ST-T wave changes.   Recent Labs: No results found for requested labs within last 8760 hours.    Lipid Panel    Component Value Date/Time   CHOL 245 (HH) 10/24/2008 0827   TRIG 100 10/24/2008 0827   HDL 55.2 10/24/2008 0827   CHOLHDL 4.4 CALC 10/24/2008 0827   VLDL 20 10/24/2008 0827   LDLDIRECT 166.6 10/24/2008 0827      Wt Readings from Last 3 Encounters:  10/22/16 148 lb (67.1 kg)  06/15/16 147 lb (66.7 kg)  03/11/15 142 lb 3.2 oz (64.5 kg)      Other studies Reviewed: Additional studies/ records that were reviewed today include: Office records. Review of the above records demonstrates:  Please see elsewhere in the note.     ASSESSMENT AND PLAN:  TACHYCARDIA:   The patient has a normal rate today. She is having symptoms. She does have a borderline blood pressure. She's had some tachycardia in the past. However, I don't think she has any history consistent with structural heart disease or dysrhythmia otherwise. I don't think that further cardiovascular testing is indicated. We did discuss primary risk reduction and in particular walking. I would continue low dose of beta blocker. I don't see a recent TSH suggest this be done. It has not been drawn in the past.  DYSLIPIDEMIA:  She is on Zetia primary risk reduction and I will defer to Maximino Greenland, MD   Current medicines are reviewed at length with the patient today.  The patient does not have concerns regarding medicines.  The following changes have been made:  no change  Labs/ tests ordered today include: None No orders of the defined types were placed in this encounter.    Disposition:   FU with me as needed.      Signed, Minus Breeding, MD  10/22/2016 2:51 PM    [facility]

## 2016-10-24 ENCOUNTER — Encounter: Payer: Self-pay | Admitting: Cardiology

## 2016-11-30 DIAGNOSIS — E559 Vitamin D deficiency, unspecified: Secondary | ICD-10-CM | POA: Diagnosis not present

## 2016-11-30 DIAGNOSIS — M25561 Pain in right knee: Secondary | ICD-10-CM | POA: Diagnosis not present

## 2017-01-07 DIAGNOSIS — M1711 Unilateral primary osteoarthritis, right knee: Secondary | ICD-10-CM | POA: Diagnosis not present

## 2017-01-07 DIAGNOSIS — M25561 Pain in right knee: Secondary | ICD-10-CM | POA: Diagnosis not present

## 2017-03-04 DIAGNOSIS — H401111 Primary open-angle glaucoma, right eye, mild stage: Secondary | ICD-10-CM | POA: Diagnosis not present

## 2017-03-07 DIAGNOSIS — Z6824 Body mass index (BMI) 24.0-24.9, adult: Secondary | ICD-10-CM | POA: Diagnosis not present

## 2017-03-07 DIAGNOSIS — E785 Hyperlipidemia, unspecified: Secondary | ICD-10-CM | POA: Diagnosis not present

## 2017-03-07 DIAGNOSIS — E559 Vitamin D deficiency, unspecified: Secondary | ICD-10-CM | POA: Diagnosis not present

## 2017-03-09 DIAGNOSIS — Z79899 Other long term (current) drug therapy: Secondary | ICD-10-CM | POA: Diagnosis not present

## 2017-03-09 DIAGNOSIS — E785 Hyperlipidemia, unspecified: Secondary | ICD-10-CM | POA: Diagnosis not present

## 2017-06-21 DIAGNOSIS — Z23 Encounter for immunization: Secondary | ICD-10-CM | POA: Diagnosis not present

## 2017-07-04 DIAGNOSIS — H401131 Primary open-angle glaucoma, bilateral, mild stage: Secondary | ICD-10-CM | POA: Diagnosis not present

## 2017-07-23 DIAGNOSIS — K2970 Gastritis, unspecified, without bleeding: Secondary | ICD-10-CM | POA: Diagnosis not present

## 2017-07-23 DIAGNOSIS — R110 Nausea: Secondary | ICD-10-CM | POA: Diagnosis not present

## 2017-07-23 DIAGNOSIS — K219 Gastro-esophageal reflux disease without esophagitis: Secondary | ICD-10-CM | POA: Diagnosis not present

## 2017-08-23 DIAGNOSIS — Z23 Encounter for immunization: Secondary | ICD-10-CM | POA: Diagnosis not present

## 2017-09-06 ENCOUNTER — Other Ambulatory Visit: Payer: Self-pay | Admitting: Obstetrics & Gynecology

## 2017-09-06 DIAGNOSIS — Z1231 Encounter for screening mammogram for malignant neoplasm of breast: Secondary | ICD-10-CM

## 2017-09-09 ENCOUNTER — Ambulatory Visit
Admission: RE | Admit: 2017-09-09 | Discharge: 2017-09-09 | Disposition: A | Discharge status: Home / Self Care | Payer: Medicare Other | Source: Ambulatory Visit | Attending: Obstetrics & Gynecology | Admitting: Obstetrics & Gynecology

## 2017-09-09 DIAGNOSIS — Z1231 Encounter for screening mammogram for malignant neoplasm of breast: Secondary | ICD-10-CM

## 2017-09-14 ENCOUNTER — Other Ambulatory Visit: Payer: Self-pay | Admitting: Obstetrics & Gynecology

## 2017-09-14 DIAGNOSIS — R928 Other abnormal and inconclusive findings on diagnostic imaging of breast: Secondary | ICD-10-CM

## 2017-09-16 ENCOUNTER — Ambulatory Visit
Admission: RE | Admit: 2017-09-16 | Discharge: 2017-09-16 | Disposition: A | Discharge status: Home / Self Care | Payer: Medicare Other | Source: Ambulatory Visit | Attending: Obstetrics & Gynecology | Admitting: Obstetrics & Gynecology

## 2017-09-16 ENCOUNTER — Encounter: Payer: Self-pay | Admitting: Obstetrics & Gynecology

## 2017-09-16 ENCOUNTER — Ambulatory Visit: Payer: Medicare Other

## 2017-09-16 ENCOUNTER — Ambulatory Visit: Payer: Medicare Other | Admitting: Obstetrics & Gynecology

## 2017-09-16 DIAGNOSIS — R922 Inconclusive mammogram: Secondary | ICD-10-CM | POA: Diagnosis not present

## 2017-09-16 DIAGNOSIS — R928 Other abnormal and inconclusive findings on diagnostic imaging of breast: Secondary | ICD-10-CM

## 2017-09-16 NOTE — Progress Notes (Deleted)
71 y.o. G0P0000 DivorcedCaucasianF here for annual exam.    Patient's last menstrual period was 10/04/1984.          Sexually active: {yes no:314532}  The current method of family planning is status post hysterectomy.    Exercising: {yes no:314532}  {types:19826} Smoker:  {YES P5382123  Health Maintenance: Pap:  2004 Normal  History of abnormal Pap:  no MMG:  09/13/17 BIRADS0:Incomplete. Has [place] appt today  Colonoscopy:  09/10/09 Polyp  BMD:   03/22/14 Normal  TDaP:  *** Pneumonia vaccine(s):  *** Zostavax:   *** Hep C testing: *** Screening Labs: ***, Hb today: ***, Urine today: ***   reports that she has quit smoking. she has never used smokeless tobacco. She reports that she does not drink alcohol or use drugs.  Past Medical History:  Diagnosis Date  . Anxiety    Chronic  . Breast mass, left 2005   fatty on U/S  . Cataract   . Fibroid 1987   TAH/USO  . Glaucoma   . Hyperlipidemia   . Uses hearing aid    Bilateral   . Vitamin D deficiency     Past Surgical History:  Procedure Laterality Date  . CATARACT EXTRACTION W/ INTRAOCULAR LENS IMPLANT Bilateral 12/2015 01/2016  . COSMETIC SURGERY     Mini face lift  . EYE SURGERY  06/2012   eye lift  . LIPOMA EXCISION Left 2009   left arm  . STOMACH SURGERY  1997   Sphincter  . TONSILLECTOMY AND ADENOIDECTOMY  1972  . TOTAL ABDOMINAL HYSTERECTOMY W/ BILATERAL SALPINGOOPHORECTOMY    . TRIGGER FINGER RELEASE     right    Current Outpatient Medications  Medication Sig Dispense Refill  . BIOTIN PO Take 1,000 mg by mouth.    . Cholecalciferol (VITAMIN D PO) Take 4,000 Int'l Units by mouth daily.    . dorzolamide (TRUSOPT) 2 % ophthalmic solution     . metoprolol succinate (TOPROL-XL) 25 MG 24 hr tablet 1/2 tablet twice daily 30 tablet 13  . PARoxetine (PAXIL) 40 MG tablet Take 1 tablet (40 mg total) by mouth daily. 90 tablet 4  . timolol (TIMOPTIC) 0.25 % ophthalmic solution Place 1 drop into both eyes 2 (two) times daily.   3  . ZETIA 10 MG tablet Take 10 mg by mouth daily.  5   No current facility-administered medications for this visit.     Family History  Problem Relation Age of Onset  . Diabetes Mother   . Prostate cancer Father   . Colon cancer Father   . Diabetes Brother   . Prostate cancer Brother     ROS:  Pertinent items are noted in HPI.  Otherwise, a comprehensive ROS was negative.  Exam:   LMP 10/04/1984   Weight change: @WEIGHTCHANGE @ Height:      Ht Readings from Last 3 Encounters:  10/22/16 5\' 4"  (1.626 m)  06/15/16 5\' 4"  (1.626 m)  03/11/15 5' 4.25" (1.632 m)    General appearance: alert, cooperative and appears stated age Head: Normocephalic, without obvious abnormality, atraumatic Neck: no adenopathy, supple, symmetrical, trachea midline and thyroid {EXAM; THYROID:18604} Lungs: clear to auscultation bilaterally Breasts: {Exam; breast:13139::"normal appearance, no masses or tenderness"} Heart: regular rate and rhythm Abdomen: soft, non-tender; bowel sounds normal; no masses,  no organomegaly Extremities: extremities normal, atraumatic, no cyanosis or edema Skin: Skin color, texture, turgor normal. No rashes or lesions Lymph nodes: Cervical, supraclavicular, and axillary nodes normal. No abnormal inguinal nodes palpated Neurologic:  Grossly normal   Pelvic: External genitalia:  no lesions              Urethra:  normal appearing urethra with no masses, tenderness or lesions              Bartholins and Skenes: normal                 Vagina: normal appearing vagina with normal color and discharge, no lesions              Cervix: {exam; cervix:14595}              Pap taken: {yes no:314532} Bimanual Exam:  Uterus:  {exam; uterus:12215}              Adnexa: {exam; adnexa:12223}               Rectovaginal: Confirms               Anus:  normal sphincter tone, no lesions  Chaperone was present for exam.  A:  Well Woman with normal exam  P:   {plan; gyn:5269::"mammogram","pap  smear","return annually or prn"}

## 2017-10-05 ENCOUNTER — Ambulatory Visit (INDEPENDENT_AMBULATORY_CARE_PROVIDER_SITE_OTHER): Payer: Medicare HMO | Admitting: Obstetrics & Gynecology

## 2017-10-05 ENCOUNTER — Encounter: Payer: Self-pay | Admitting: Obstetrics & Gynecology

## 2017-10-05 ENCOUNTER — Other Ambulatory Visit: Payer: Self-pay

## 2017-10-05 ENCOUNTER — Telehealth: Payer: Self-pay | Admitting: *Deleted

## 2017-10-05 VITALS — BP 118/60 | HR 80 | Resp 14 | Ht 64.0 in | Wt 139.0 lb

## 2017-10-05 DIAGNOSIS — Z86010 Personal history of colonic polyps: Secondary | ICD-10-CM

## 2017-10-05 DIAGNOSIS — E2839 Other primary ovarian failure: Secondary | ICD-10-CM

## 2017-10-05 DIAGNOSIS — Z01419 Encounter for gynecological examination (general) (routine) without abnormal findings: Secondary | ICD-10-CM

## 2017-10-05 MED ORDER — PAROXETINE HCL 40 MG PO TABS: 40.0000 mg | ORAL_TABLET | Freq: Every day | ORAL | 4 refills | Status: DC

## 2017-10-05 NOTE — Addendum Note (Signed)
Addended by: Megan Salon on: 10/05/2017 02:04 PM   Modules accepted: Orders

## 2017-10-05 NOTE — Telephone Encounter (Signed)
Called patient. Unable to leave voicemail

## 2017-10-05 NOTE — Progress Notes (Addendum)
72 y.o. G0P0000 DivorcedCaucasianF here for annual exam.  Doing well.  No vaginal bleeding.  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    On Paxil 40mg .  Does not want to change dosage.  Patient's last menstrual period was 10/04/1984.          Sexually active: No.  The current method of family planning is post menopausal status.    Exercising: Yes.    walking Smoker:  no  Health Maintenance: Pap:  ~2004 normal  History of abnormal Pap:  no MMG:  09/16/17 Diagnostic Right BIRADS1:neg. F/u 1 year  Colonoscopy:  09/10/09 Polyps BMD:   03/22/14 Normal  TDaP:  07/2017 Pneumonia vaccine(s):  Done Shingrix:   Done Hep C testing: No Screening Labs: PCP   reports that she has quit smoking. she has never used smokeless tobacco. She reports that she does not drink alcohol or use drugs.  Past Medical History:  Diagnosis Date  . Anxiety    Chronic  . Breast mass, left 2005   fatty on U/S  . Cataract   . Fibroid 1987   TAH/USO  . Glaucoma   . Hyperlipidemia   . Uses hearing aid    Bilateral   . Vitamin D deficiency     Past Surgical History:  Procedure Laterality Date  . CATARACT EXTRACTION W/ INTRAOCULAR LENS IMPLANT Bilateral 12/2015 01/2016  . COSMETIC SURGERY     Mini face lift  . EYE SURGERY  06/2012   eye lift  . LIPOMA EXCISION Left 2009   left arm  . STOMACH SURGERY  1997   Sphincter  . TONSILLECTOMY AND ADENOIDECTOMY  1972  . TOTAL ABDOMINAL HYSTERECTOMY W/ BILATERAL SALPINGOOPHORECTOMY    . TRIGGER FINGER RELEASE     right    Current Outpatient Medications  Medication Sig Dispense Refill  . Cholecalciferol (VITAMIN D PO) Take 4,000 Int'l Units by mouth daily.    . dorzolamide (TRUSOPT) 2 % ophthalmic solution     . metoprolol succinate (TOPROL-XL) 25 MG 24 hr tablet Take 1 tablet by mouth daily.    Marland Kitchen PARoxetine (PAXIL) 40 MG tablet Take 1 tablet (40 mg total) by mouth daily. 90 tablet 4  . timolol (TIMOPTIC) 0.25 % ophthalmic solution Place 1 drop into  both eyes 2 (two) times daily.  3  . ZETIA 10 MG tablet Take 10 mg by mouth daily.  5   No current facility-administered medications for this visit.     Family History  Problem Relation Age of Onset  . Diabetes Mother   . Prostate cancer Father   . Colon cancer Father   . Diabetes Brother   . Prostate cancer Brother     ROS:  Pertinent items are noted in HPI.  Otherwise, a comprehensive ROS was negative.  Exam:   BP 118/60 (BP Location: Right Arm, Patient Position: Sitting, Cuff Size: Normal)   Pulse 80   Resp 14   Ht 5\' 4"  (1.626 m)   Wt 139 lb (63 kg)   LMP 10/04/1984   BMI 23.86 kg/m    Height: 5\' 4"  (162.6 cm)  Ht Readings from Last 3 Encounters:  10/05/17 5\' 4"  (1.626 m)  10/22/16 5\' 4"  (1.626 m)  06/15/16 5\' 4"  (1.626 m)    General appearance: alert, cooperative and appears stated age Head: Normocephalic, without obvious abnormality, atraumatic Neck: no adenopathy, supple, symmetrical, trachea midline and thyroid normal to inspection and palpation Lungs: clear to auscultation bilaterally Breasts: normal appearance,  no masses or tenderness Heart: regular rate and rhythm Abdomen: soft, non-tender; bowel sounds normal; no masses,  no organomegaly Extremities: extremities normal, atraumatic, no cyanosis or edema Skin: Skin color, texture, turgor normal. No rashes or lesions Lymph nodes: Cervical, supraclavicular, and axillary nodes normal. No abnormal inguinal nodes palpated Neurologic: Grossly normal   Pelvic: External genitalia:  no lesions              Urethra:  normal appearing urethra with no masses, tenderness or lesions              Bartholins and Skenes: normal                 Vagina: normal appearing vagina with normal color and discharge, no lesions              Cervix: absent              Pap taken: No. Bimanual Exam:  Uterus:  uterus absent              Adnexa: no mass, fullness, tenderness               Rectovaginal: Confirms               Anus:   normal sphincter tone, no lesions  Chaperone was present for exam.  A:  Well Woman with normal exam PMP, no HRT Remote hx of alcohol abuse HO PTSD and depression H/O TAH/USO Anxiety Elevated lipids Family hx of colon cancer in father  P:   Mammogram guidelines reviewed pap smear not indicated RF for Paxil 40mg  daily.  #90/4RF.   Lab work done with Dr. Baird Cancer Colonoscopy overdue per colonoscopy report.  Referral will be made to Dr. Silverio Decamp.  Pt used to see Dr. Olevia Perches. Return annually or prn

## 2017-10-05 NOTE — Telephone Encounter (Signed)
Patient's last colonoscopy was 06/17/09 - Normal. Needs to repeat 5 years due to family hx. Dr. Sabra Heck put in a referral for GI since Dr. Johna Roles is no longer available.

## 2017-10-06 NOTE — Telephone Encounter (Signed)
Second attempt to contact patient. Unable to leave message.

## 2017-10-12 ENCOUNTER — Encounter: Payer: Self-pay | Admitting: Gastroenterology

## 2017-10-13 NOTE — Telephone Encounter (Signed)
Third attempt to contact patient. Unable to leave voicemail.

## 2017-10-18 NOTE — Telephone Encounter (Signed)
Patient has appt GI appt 11/29/17 per referral notes.

## 2017-10-26 DIAGNOSIS — R69 Illness, unspecified: Secondary | ICD-10-CM | POA: Diagnosis not present

## 2017-11-10 DIAGNOSIS — Z0000 Encounter for general adult medical examination without abnormal findings: Secondary | ICD-10-CM | POA: Diagnosis not present

## 2017-11-10 DIAGNOSIS — E785 Hyperlipidemia, unspecified: Secondary | ICD-10-CM | POA: Diagnosis not present

## 2017-11-10 DIAGNOSIS — E559 Vitamin D deficiency, unspecified: Secondary | ICD-10-CM | POA: Diagnosis not present

## 2017-11-10 DIAGNOSIS — E2839 Other primary ovarian failure: Secondary | ICD-10-CM | POA: Diagnosis not present

## 2017-11-10 DIAGNOSIS — R7309 Other abnormal glucose: Secondary | ICD-10-CM | POA: Diagnosis not present

## 2017-11-29 ENCOUNTER — Other Ambulatory Visit: Payer: Self-pay

## 2017-11-29 ENCOUNTER — Ambulatory Visit (AMBULATORY_SURGERY_CENTER): Payer: Self-pay

## 2017-11-29 VITALS — Ht 64.0 in | Wt 139.8 lb

## 2017-11-29 DIAGNOSIS — Z800 Family history of malignant neoplasm of digestive organs: Secondary | ICD-10-CM

## 2017-11-29 MED ORDER — NA SULFATE-K SULFATE-MG SULF 17.5-3.13-1.6 GM/177ML PO SOLN: 1.0000 | Freq: Once | ORAL | 0 refills | Status: AC

## 2017-11-29 NOTE — Progress Notes (Signed)
No egg or soy allergy known to patient  No issues with past sedation with any surgeries  or procedures, no intubation problems  No diet pills per patient No home 02 use per patient  No blood thinners per patient  Pt denies issues with constipation  No A fib or A flutter  EMMI video sent to pt's e mail sent during PV.

## 2017-12-01 ENCOUNTER — Encounter: Payer: Self-pay | Admitting: Gastroenterology

## 2017-12-13 ENCOUNTER — Other Ambulatory Visit: Payer: Self-pay

## 2017-12-13 ENCOUNTER — Ambulatory Visit (AMBULATORY_SURGERY_CENTER): Payer: Medicare HMO | Admitting: Gastroenterology

## 2017-12-13 ENCOUNTER — Encounter: Payer: Self-pay | Admitting: Gastroenterology

## 2017-12-13 VITALS — BP 102/60 | HR 58 | Temp 98.4°F | Resp 26 | Ht 64.0 in | Wt 139.0 lb

## 2017-12-13 DIAGNOSIS — Z1211 Encounter for screening for malignant neoplasm of colon: Secondary | ICD-10-CM | POA: Diagnosis not present

## 2017-12-13 DIAGNOSIS — Z800 Family history of malignant neoplasm of digestive organs: Secondary | ICD-10-CM

## 2017-12-13 MED ORDER — SODIUM CHLORIDE 0.9 % IV SOLN: 500.0000 mL | Freq: Once | INTRAVENOUS | Status: DC

## 2017-12-13 NOTE — Progress Notes (Signed)
Pt's states no medical or surgical changes since previsit or office visit. 

## 2017-12-13 NOTE — Op Note (Signed)
[facility] Patient Name: Mary Krueger Procedure Date: 12/13/2017 10:43 AM MRN: [id number] Endoscopist: Mauri Pole , MD Age: 72 Referring MD:  Date of Birth: 1945-10-24 Gender: Female Account #: [id number] Procedure:                Colonoscopy Indications:              Screening patient at increased risk: Family history                            of 1st-degree relative with colorectal cancer at                            age 32 years (or older), Last colonoscopy: 2010 Medicines:                Monitored Anesthesia Care Procedure:                Pre-Anesthesia Assessment:                           - Prior to the procedure, a History and Physical                            was performed, and patient medications and                            allergies were reviewed. The patient's tolerance of                            previous anesthesia was also reviewed. The risks                            and benefits of the procedure and the sedation                            options and risks were discussed with the patient.                            All questions were answered, and informed consent                            was obtained. Prior Anticoagulants: The patient has                            taken no previous anticoagulant or antiplatelet                            agents. ASA Grade Assessment: II - A patient with                            mild systemic disease. After reviewing the risks                            and benefits, the patient was deemed in  satisfactory condition to undergo the procedure.                           After obtaining informed consent, the colonoscope                            was passed under direct vision. Throughout the                            procedure, the patient's blood pressure, pulse, and                            oxygen saturations were monitored continuously. The   Colonoscope was introduced through the anus and                            advanced to the the cecum, identified by                            appendiceal orifice and ileocecal valve. The                            colonoscopy was performed without difficulty. The                            patient tolerated the procedure well. The quality                            of the bowel preparation was excellent. The                            ileocecal valve, appendiceal orifice, and rectum                            were photographed. Scope In: 10:51:44 AM Scope Out: 11:09:50 AM Scope Withdrawal Time: 0 hours 8 minutes 28 seconds  Total Procedure Duration: 0 hours 18 minutes 6 seconds  Findings:                 The perianal and digital rectal examinations were                            normal.                           Multiple small and large-mouthed diverticula were                            found in the sigmoid colon and descending colon.                           Non-bleeding internal hemorrhoids were found during                            retroflexion. The hemorrhoids were small.  The exam was otherwise without abnormality. Complications:            No immediate complications. Estimated Blood Loss:     Estimated blood loss: none. Impression:               - Moderate diverticulosis in the sigmoid colon and                            in the descending colon.                           - Non-bleeding internal hemorrhoids.                           - The examination was otherwise normal.                           - No specimens collected. Recommendation:           - Patient has a contact number available for                            emergencies. The signs and symptoms of potential                            delayed complications were discussed with the                            patient. Return to normal activities tomorrow.                            Written  discharge instructions were provided to the                            patient.                           - Resume previous diet.                           - Continue present medications.                           - Repeat colonoscopy in 5 years for screening                            purposes. Mauri Pole, MD 12/13/2017 11:14:22 AM This report has been signed electronically.

## 2017-12-13 NOTE — Patient Instructions (Signed)
YOU HAD AN ENDOSCOPIC PROCEDURE TODAY AT [facility]:   Refer to the procedure report that was given to you for any specific questions about what was found during the examination.  If the procedure report does not answer your questions, please call your gastroenterologist to clarify.  If you requested that your care partner not be given the details of your procedure findings, then the procedure report has been included in a sealed envelope for you to review at your convenience later.  YOU SHOULD EXPECT: Some feelings of bloating in the abdomen. Passage of more gas than usual.  Walking can help get rid of the air that was put into your GI tract during the procedure and reduce the bloating. If you had a lower endoscopy (such as a colonoscopy or flexible sigmoidoscopy) you may notice spotting of blood in your stool or on the toilet paper. If you underwent a bowel prep for your procedure, you may not have a normal bowel movement for a few days.  Please Note:  You might notice some irritation and congestion in your nose or some drainage.  This is from the oxygen used during your procedure.  There is no need for concern and it should clear up in a day or so.  SYMPTOMS TO REPORT IMMEDIATELY:   Following lower endoscopy (colonoscopy or flexible sigmoidoscopy):  Excessive amounts of blood in the stool  Significant tenderness or worsening of abdominal pains  Swelling of the abdomen that is new, acute  Fever of 100F or higher   For urgent or emergent issues, a gastroenterologist can be reached at any hour by calling [phone].   DIET:  We do recommend a small meal at first, but then you may proceed to your regular diet.  Drink plenty of fluids but you should avoid alcoholic beverages for 24 hours.  Try to increase the fiber in your diet, and drink plenty of fluid.  ACTIVITY:  You should plan to take it easy for the rest of today and you should NOT DRIVE or use heavy machinery until  tomorrow (because of the sedation medicines used during the test).    FOLLOW UP: Our staff will call the number listed on your records the next business day following your procedure to check on you and address any questions or concerns that you may have regarding the information given to you following your procedure. If we do not reach you, we will leave a message.  However, if you are feeling well and you are not experiencing any problems, there is no need to return our call.  We will assume that you have returned to your regular daily activities without incident.  If any biopsies were taken you will be contacted by phone or by letter within the next 1-3 weeks.  Please call us at [phone] if you have not heard about the biopsies in 3 weeks.    SIGNATURES/CONFIDENTIALITY: You and/or your care partner have signed paperwork which will be entered into your electronic medical record.  These signatures attest to the fact that that the information above on your After Visit Summary has been reviewed and is understood.  Full responsibility of the confidentiality of this discharge information lies with you and/or your care-partner.

## 2017-12-13 NOTE — Progress Notes (Signed)
To Pacu, VSS. Report to [email or web address] 

## 2017-12-14 ENCOUNTER — Telehealth: Payer: Self-pay

## 2017-12-14 NOTE — Telephone Encounter (Signed)
  Follow up Call-  Call back number 12/13/2017  Post procedure Call Back phone  # [phone]  Permission to leave phone message Yes  Some recent data might be hidden     Patient questions:  Do you have a fever, pain , or abdominal swelling? No. Pain Score  0 *  Have you tolerated food without any problems? Yes.    Have you been able to return to your normal activities? Yes.    Do you have any questions about your discharge instructions: Diet   No. Medications  No. Follow up visit  No.  Do you have questions or concerns about your Care? No.  Actions: * If pain score is 4 or above: No action needed, pain <4.

## 2018-01-12 DIAGNOSIS — L281 Prurigo nodularis: Secondary | ICD-10-CM | POA: Diagnosis not present

## 2018-02-07 DIAGNOSIS — J209 Acute bronchitis, unspecified: Secondary | ICD-10-CM | POA: Diagnosis not present

## 2018-02-07 DIAGNOSIS — R05 Cough: Secondary | ICD-10-CM | POA: Diagnosis not present

## 2018-02-07 DIAGNOSIS — J320 Chronic maxillary sinusitis: Secondary | ICD-10-CM | POA: Diagnosis not present

## 2018-02-15 ENCOUNTER — Ambulatory Visit (INDEPENDENT_AMBULATORY_CARE_PROVIDER_SITE_OTHER): Payer: Medicare HMO | Admitting: Licensed Clinical Social Worker

## 2018-02-15 DIAGNOSIS — R69 Illness, unspecified: Secondary | ICD-10-CM | POA: Diagnosis not present

## 2018-02-15 DIAGNOSIS — F419 Anxiety disorder, unspecified: Secondary | ICD-10-CM

## 2018-03-01 ENCOUNTER — Ambulatory Visit (INDEPENDENT_AMBULATORY_CARE_PROVIDER_SITE_OTHER): Payer: Medicare HMO | Admitting: Licensed Clinical Social Worker

## 2018-03-01 DIAGNOSIS — F419 Anxiety disorder, unspecified: Secondary | ICD-10-CM | POA: Diagnosis not present

## 2018-03-01 DIAGNOSIS — R69 Illness, unspecified: Secondary | ICD-10-CM | POA: Diagnosis not present

## 2018-03-03 DIAGNOSIS — H524 Presbyopia: Secondary | ICD-10-CM | POA: Diagnosis not present

## 2018-03-03 DIAGNOSIS — H5202 Hypermetropia, left eye: Secondary | ICD-10-CM | POA: Diagnosis not present

## 2018-03-03 DIAGNOSIS — H52223 Regular astigmatism, bilateral: Secondary | ICD-10-CM | POA: Diagnosis not present

## 2018-03-15 ENCOUNTER — Ambulatory Visit (INDEPENDENT_AMBULATORY_CARE_PROVIDER_SITE_OTHER): Payer: Medicare HMO | Admitting: Licensed Clinical Social Worker

## 2018-03-15 DIAGNOSIS — F419 Anxiety disorder, unspecified: Secondary | ICD-10-CM | POA: Diagnosis not present

## 2018-03-15 DIAGNOSIS — R69 Illness, unspecified: Secondary | ICD-10-CM | POA: Diagnosis not present

## 2018-03-27 ENCOUNTER — Ambulatory Visit (INDEPENDENT_AMBULATORY_CARE_PROVIDER_SITE_OTHER): Payer: Medicare HMO | Admitting: Licensed Clinical Social Worker

## 2018-03-27 DIAGNOSIS — F418 Other specified anxiety disorders: Secondary | ICD-10-CM

## 2018-03-27 DIAGNOSIS — R69 Illness, unspecified: Secondary | ICD-10-CM | POA: Diagnosis not present

## 2018-03-31 DIAGNOSIS — H40013 Open angle with borderline findings, low risk, bilateral: Secondary | ICD-10-CM | POA: Diagnosis not present

## 2018-04-25 ENCOUNTER — Ambulatory Visit (INDEPENDENT_AMBULATORY_CARE_PROVIDER_SITE_OTHER): Payer: Medicare HMO | Admitting: Licensed Clinical Social Worker

## 2018-04-25 DIAGNOSIS — F331 Major depressive disorder, recurrent, moderate: Secondary | ICD-10-CM

## 2018-04-25 DIAGNOSIS — R69 Illness, unspecified: Secondary | ICD-10-CM | POA: Diagnosis not present

## 2018-05-09 ENCOUNTER — Ambulatory Visit (INDEPENDENT_AMBULATORY_CARE_PROVIDER_SITE_OTHER): Payer: Medicare HMO | Admitting: Licensed Clinical Social Worker

## 2018-05-09 DIAGNOSIS — F3341 Major depressive disorder, recurrent, in partial remission: Secondary | ICD-10-CM | POA: Diagnosis not present

## 2018-05-09 DIAGNOSIS — R69 Illness, unspecified: Secondary | ICD-10-CM | POA: Diagnosis not present

## 2018-05-10 DIAGNOSIS — R7309 Other abnormal glucose: Secondary | ICD-10-CM | POA: Diagnosis not present

## 2018-05-10 DIAGNOSIS — Z79899 Other long term (current) drug therapy: Secondary | ICD-10-CM | POA: Diagnosis not present

## 2018-05-10 DIAGNOSIS — E785 Hyperlipidemia, unspecified: Secondary | ICD-10-CM | POA: Diagnosis not present

## 2018-05-10 DIAGNOSIS — M25562 Pain in left knee: Secondary | ICD-10-CM | POA: Diagnosis not present

## 2018-05-10 DIAGNOSIS — R000 Tachycardia, unspecified: Secondary | ICD-10-CM | POA: Diagnosis not present

## 2018-05-10 LAB — BASIC METABOLIC PANEL
Creatinine: 1 (ref 0.5–1.1)
GLUCOSE: 84
POTASSIUM: 4.7 (ref 3.4–5.3)
Sodium: 141 (ref 137–147)

## 2018-05-10 LAB — LIPID PANEL
Cholesterol: 246 — AB (ref 0–200)
HDL: 68 (ref 35–70)
LDL Cholesterol: 161
LDL/HDL RATIO: 2.4
TRIGLYCERIDES: 85 (ref 40–160)

## 2018-05-10 LAB — HEMOGLOBIN A1C: Hemoglobin A1C: 5.6

## 2018-05-10 LAB — HEPATIC FUNCTION PANEL: Alkaline Phosphatase: 45 (ref 25–125)

## 2018-05-18 DIAGNOSIS — R69 Illness, unspecified: Secondary | ICD-10-CM | POA: Diagnosis not present

## 2018-06-08 ENCOUNTER — Ambulatory Visit (INDEPENDENT_AMBULATORY_CARE_PROVIDER_SITE_OTHER): Payer: Medicare HMO | Admitting: Licensed Clinical Social Worker

## 2018-06-08 DIAGNOSIS — F3341 Major depressive disorder, recurrent, in partial remission: Secondary | ICD-10-CM | POA: Diagnosis not present

## 2018-06-08 DIAGNOSIS — R69 Illness, unspecified: Secondary | ICD-10-CM | POA: Diagnosis not present

## 2018-06-15 DIAGNOSIS — M25562 Pain in left knee: Secondary | ICD-10-CM | POA: Diagnosis not present

## 2018-06-17 ENCOUNTER — Encounter: Payer: Self-pay | Admitting: Internal Medicine

## 2018-06-17 DIAGNOSIS — R7309 Other abnormal glucose: Secondary | ICD-10-CM | POA: Insufficient documentation

## 2018-06-17 DIAGNOSIS — R000 Tachycardia, unspecified: Secondary | ICD-10-CM | POA: Insufficient documentation

## 2018-06-21 ENCOUNTER — Ambulatory Visit (INDEPENDENT_AMBULATORY_CARE_PROVIDER_SITE_OTHER): Payer: Medicare HMO | Admitting: Licensed Clinical Social Worker

## 2018-06-21 DIAGNOSIS — F3341 Major depressive disorder, recurrent, in partial remission: Secondary | ICD-10-CM

## 2018-06-21 DIAGNOSIS — R69 Illness, unspecified: Secondary | ICD-10-CM | POA: Diagnosis not present

## 2018-06-27 DIAGNOSIS — Z79899 Other long term (current) drug therapy: Secondary | ICD-10-CM | POA: Diagnosis not present

## 2018-06-27 DIAGNOSIS — E785 Hyperlipidemia, unspecified: Secondary | ICD-10-CM | POA: Diagnosis not present

## 2018-07-21 ENCOUNTER — Other Ambulatory Visit: Payer: Self-pay

## 2018-07-21 MED ORDER — PITAVASTATIN CALCIUM 2 MG PO TABS: 1.0000 | ORAL_TABLET | Freq: Every day | ORAL | 2 refills | Status: DC

## 2018-07-23 ENCOUNTER — Other Ambulatory Visit: Payer: Self-pay | Admitting: Internal Medicine

## 2018-07-30 DIAGNOSIS — R69 Illness, unspecified: Secondary | ICD-10-CM | POA: Diagnosis not present

## 2018-08-16 DIAGNOSIS — R69 Illness, unspecified: Secondary | ICD-10-CM | POA: Diagnosis not present

## 2018-08-25 ENCOUNTER — Other Ambulatory Visit: Payer: Self-pay | Admitting: Obstetrics & Gynecology

## 2018-08-25 DIAGNOSIS — Z1231 Encounter for screening mammogram for malignant neoplasm of breast: Secondary | ICD-10-CM

## 2018-10-05 NOTE — Progress Notes (Signed)
73 y.o. [place] Divorced White or Caucasian female here for annual exam.  Doing well.  Stopped working at [employer].  Denies vaginal bleeding.  She did get a flu shot.    Has new psychiatrist this past year.  Does not need RF.  Seeing Marya Amsler.    PCP:  Dr. Baird Cancer.  Blood work was done in September.  Has started Fenofibrate.    Patient's last menstrual period was 10/04/1984.          Sexually active: No.  The current method of family planning is post menopausal status.    Exercising: Yes.    Walking Smoker:  no  Health Maintenance: Pap: 2004 normal History of abnormal Pap:  no MMG: 09/16/17 Density C Bi-rads 1neg.  Scheduled 10/20/2018.   Colonoscopy:  12/13/17.  This was negative.   BMD: 03/22/14 Normal     TDaP:  10/18 Pneumonia vaccine(s):  2017 Shingrix:  Has completed Hep C testing: 2017 neg  Screening Labs: PCP, Hb today: PCP, Urine today:    reports that she has quit smoking. She has never used smokeless tobacco. She reports that she does not drink alcohol or use drugs.  Past Medical History:  Diagnosis Date  . Anxiety    Chronic  . Breast mass, left 2005   fatty on U/S  . Cataract   . Fibroid 1987   TAH/USO  . Glaucoma   . Hyperlipidemia   . Uses hearing aid    Bilateral   . Vitamin D deficiency     Past Surgical History:  Procedure Laterality Date  . CATARACT EXTRACTION W/ INTRAOCULAR LENS IMPLANT Bilateral 12/2015 01/2016  . COSMETIC SURGERY     Mini face lift  . EYE SURGERY  06/2012   eye lift  . LIPOMA EXCISION Left 2009   left arm  . STOMACH SURGERY  1997   Sphincter  . TONSILLECTOMY AND ADENOIDECTOMY  1972  . TOTAL ABDOMINAL HYSTERECTOMY W/ BILATERAL SALPINGOOPHORECTOMY    . TRIGGER FINGER RELEASE     right    Current Outpatient Medications  Medication Sig Dispense Refill  . Cholecalciferol (VITAMIN D PO) Take 4,000 Int'l Units by mouth daily.    . dorzolamide (TRUSOPT) 2 % ophthalmic solution     . meloxicam (MOBIC) 15 MG tablet Take 15 mg by  mouth daily.    . metoprolol succinate (TOPROL-XL) 25 MG 24 hr tablet TAKE ONE TABLET BY MOUTH EVERY EVENING 90 tablet 0  . PARoxetine (PAXIL) 40 MG tablet Take 1 tablet (40 mg total) by mouth daily. 90 tablet 4  . Pitavastatin Calcium (LIVALO) 2 MG TABS Take by mouth.     No current facility-administered medications for this visit.     Family History  Problem Relation Age of Onset  . Diabetes Mother   . Prostate cancer Father   . Colon cancer Father   . Diabetes Brother   . Prostate cancer Brother   . Colon polyps Neg Hx   . Esophageal cancer Neg Hx   . Stomach cancer Neg Hx   . Rectal cancer Neg Hx     Review of Systems  HENT: Positive for hearing loss.   Musculoskeletal:       Muscle joint pain      Exam:   BP (!) 150/60   Pulse 72   Resp 14   Ht 5' 4.25" (1.632 m)   Wt 141 lb (64 kg)   LMP 10/04/1984   BMI 24.01 kg/m  Height: 5' 4.25" (163.2 cm)  Ht Readings from Last 3 Encounters:  10/06/18 5' 4.25" (1.632 m)  05/10/18 5' 3.5" (1.613 m)  12/13/17 5\' 4"  (1.626 m)    General appearance: alert, cooperative and appears stated age Head: Normocephalic, without obvious abnormality, atraumatic Neck: no adenopathy, supple, symmetrical, trachea midline and thyroid normal to inspection and palpation Lungs: clear to auscultation bilaterally Breasts: normal appearance, no masses or tenderness Heart: regular rate and rhythm Abdomen: soft, non-tender; bowel sounds normal; no masses,  no organomegaly Extremities: extremities normal, atraumatic, no cyanosis or edema Skin: Skin color, texture, turgor normal. No rashes or lesions Lymph nodes: Cervical, supraclavicular, and axillary nodes normal. No abnormal inguinal nodes palpated Neurologic: Grossly normal   Pelvic: External genitalia:  no lesions              Urethra:  normal appearing urethra with no masses, tenderness or lesions              Bartholins and Skenes: normal                 Vagina: normal appearing  vagina with normal color and discharge, no lesions              Cervix: absent              Pap taken: No. Bimanual Exam:  Uterus:  uterus absent              Adnexa: normal adnexa and no mass, fullness, tenderness               Rectovaginal: Confirms               Anus:  normal sphincter tone, no lesions  Chaperone was present for exam.  A:  Well Woman with normal exam PMP, no HRT Remote hx of alcohol abuse H/O PTSD and depression Anxiety H/O elevated lipids H/o hx of colon cancer in father  P:   Mammogram guidelines reviewed.   pap smear not indicated RF for Paxil 40mg  daily.  #90/4RF Lab work done with Dr. Baird Cancer Vaccines are UTD Return annually or prn

## 2018-10-06 ENCOUNTER — Ambulatory Visit (INDEPENDENT_AMBULATORY_CARE_PROVIDER_SITE_OTHER): Payer: Medicare HMO | Admitting: Obstetrics & Gynecology

## 2018-10-06 ENCOUNTER — Encounter: Payer: Self-pay | Admitting: Obstetrics & Gynecology

## 2018-10-06 VITALS — BP 150/60 | HR 72 | Resp 14 | Ht 64.25 in | Wt 141.0 lb

## 2018-10-06 DIAGNOSIS — Z01419 Encounter for gynecological examination (general) (routine) without abnormal findings: Secondary | ICD-10-CM

## 2018-10-06 MED ORDER — TRETINOIN 0.05 % EX CREA: TOPICAL_CREAM | Freq: Every day | CUTANEOUS | 0 refills | Status: DC

## 2018-10-20 DIAGNOSIS — H40053 Ocular hypertension, bilateral: Secondary | ICD-10-CM | POA: Diagnosis not present

## 2018-10-20 DIAGNOSIS — H40013 Open angle with borderline findings, low risk, bilateral: Secondary | ICD-10-CM | POA: Diagnosis not present

## 2018-10-23 ENCOUNTER — Ambulatory Visit
Admission: RE | Admit: 2018-10-23 | Discharge: 2018-10-23 | Disposition: A | Discharge status: Home / Self Care | Payer: Medicare HMO | Source: Ambulatory Visit | Attending: Obstetrics & Gynecology | Admitting: Obstetrics & Gynecology

## 2018-10-23 DIAGNOSIS — Z780 Asymptomatic menopausal state: Secondary | ICD-10-CM | POA: Diagnosis not present

## 2018-10-23 DIAGNOSIS — Z1231 Encounter for screening mammogram for malignant neoplasm of breast: Secondary | ICD-10-CM

## 2018-10-23 DIAGNOSIS — M8588 Other specified disorders of bone density and structure, other site: Secondary | ICD-10-CM | POA: Diagnosis not present

## 2018-10-23 DIAGNOSIS — E2839 Other primary ovarian failure: Secondary | ICD-10-CM

## 2018-11-21 DIAGNOSIS — R69 Illness, unspecified: Secondary | ICD-10-CM | POA: Diagnosis not present

## 2018-12-08 DIAGNOSIS — R69 Illness, unspecified: Secondary | ICD-10-CM | POA: Diagnosis not present

## 2018-12-11 DIAGNOSIS — H401131 Primary open-angle glaucoma, bilateral, mild stage: Secondary | ICD-10-CM | POA: Diagnosis not present

## 2018-12-14 ENCOUNTER — Encounter: Payer: Self-pay | Admitting: Internal Medicine

## 2018-12-14 ENCOUNTER — Other Ambulatory Visit: Payer: Self-pay

## 2018-12-14 ENCOUNTER — Ambulatory Visit (INDEPENDENT_AMBULATORY_CARE_PROVIDER_SITE_OTHER): Payer: Medicare HMO | Admitting: Internal Medicine

## 2018-12-14 ENCOUNTER — Ambulatory Visit (INDEPENDENT_AMBULATORY_CARE_PROVIDER_SITE_OTHER): Payer: Medicare HMO

## 2018-12-14 ENCOUNTER — Encounter: Payer: Medicare HMO | Admitting: Internal Medicine

## 2018-12-14 VITALS — BP 122/70 | HR 72 | Temp 97.7°F | Ht 63.6 in | Wt 137.6 lb

## 2018-12-14 DIAGNOSIS — I10 Essential (primary) hypertension: Secondary | ICD-10-CM | POA: Insufficient documentation

## 2018-12-14 DIAGNOSIS — R7309 Other abnormal glucose: Secondary | ICD-10-CM

## 2018-12-14 DIAGNOSIS — Z0000 Encounter for general adult medical examination without abnormal findings: Secondary | ICD-10-CM

## 2018-12-14 DIAGNOSIS — E7800 Pure hypercholesterolemia, unspecified: Secondary | ICD-10-CM | POA: Insufficient documentation

## 2018-12-14 LAB — POCT URINALYSIS DIPSTICK
BILIRUBIN UA: NEGATIVE
GLUCOSE UA: NEGATIVE
Ketones, UA: NEGATIVE
Leukocytes, UA: NEGATIVE
NITRITE UA: NEGATIVE
Protein, UA: NEGATIVE
SPEC GRAV UA: 1.015 (ref 1.010–1.025)
Urobilinogen, UA: 0.2 E.U./dL
pH, UA: 8.5 — AB (ref 5.0–8.0)

## 2018-12-14 MED ORDER — VITAMIN D 125 MCG (5000 UT) PO CAPS: ORAL_CAPSULE | ORAL | 0 refills | Status: AC

## 2018-12-14 NOTE — Patient Instructions (Signed)
[name] , Thank you for taking time to come for your Medicare Wellness Visit. I appreciate your ongoing commitment to your health goals. Please review the following plan we discussed and let me know if I can assist you in the future.   Screening recommendations/referrals: Colonoscopy: 12/2017 Mammogram: 10/2018 Bone Density: 10/2018 Recommended yearly ophthalmology/optometry visit for glaucoma screening and checkup Recommended yearly dental visit for hygiene and checkup  Vaccinations: Influenza vaccine: 07/2018 Pneumococcal vaccine:  Tdap vaccine: 05/2013 Shingles vaccine: states had both shingrix    Advanced directives: Please bring a copy of your POA (Power of [place]) and/or Living Will to your next appointment.    Conditions/risks identified: Stopped cholesterol med  Next appointment: 06/21/2019 at 1100   Preventive Care 65 Years and Older, Female Preventive care refers to lifestyle choices and visits with your health care provider that can promote health and wellness. What does preventive care include?  A yearly physical exam. This is also called an annual well check.  Dental exams once or twice a year.  Routine eye exams. Ask your health care provider how often you should have your eyes checked.  Personal lifestyle choices, including:  Daily care of your teeth and gums.  Regular physical activity.  Eating a healthy diet.  Avoiding tobacco and drug use.  Limiting alcohol use.  Practicing safe sex.  Taking low-dose aspirin every day.  Taking vitamin and mineral supplements as recommended by your health care provider. What happens during an annual well check? The services and screenings done by your health care provider during your annual well check will depend on your age, overall health, lifestyle risk factors, and family history of disease. Counseling  Your health care provider may ask you questions about your:  Alcohol use.  Tobacco use.  Drug use.   Emotional well-being.  Home and relationship well-being.  Sexual activity.  Eating habits.  History of falls.  Memory and ability to understand (cognition).  Work and work Statistician.  Reproductive health. Screening  You may have the following tests or measurements:  Height, weight, and BMI.  Blood pressure.  Lipid and cholesterol levels. These may be checked every 5 years, or more frequently if you are over 43 years old.  Skin check.  Lung cancer screening. You may have this screening every year starting at age 74 if you have a 30-pack-year history of smoking and currently smoke or have quit within the past 15 years.  Fecal occult blood test (FOBT) of the stool. You may have this test every year starting at age 62.  Flexible sigmoidoscopy or colonoscopy. You may have a sigmoidoscopy every 5 years or a colonoscopy every 10 years starting at age 41.  Hepatitis C blood test.  Hepatitis B blood test.  Sexually transmitted disease (STD) testing.  Diabetes screening. This is done by checking your blood sugar (glucose) after you have not eaten for a while (fasting). You may have this done every 1-3 years.  Bone density scan. This is done to screen for osteoporosis. You may have this done starting at age 41.  Mammogram. This may be done every 1-2 years. Talk to your health care provider about how often you should have regular mammograms. Talk with your health care provider about your test results, treatment options, and if necessary, the need for more tests. Vaccines  Your health care provider may recommend certain vaccines, such as:  Influenza vaccine. This is recommended every year.  Tetanus, diphtheria, and acellular pertussis (Tdap, Td) vaccine. You  may need a Td booster every 10 years.  Zoster vaccine. You may need this after age 43.  Pneumococcal 13-valent conjugate (PCV13) vaccine. One dose is recommended after age 72.  Pneumococcal polysaccharide (PPSV23)  vaccine. One dose is recommended after age 22. Talk to your health care provider about which screenings and vaccines you need and how often you need them. This information is not intended to replace advice given to you by your health care provider. Make sure you discuss any questions you have with your health care provider. Document Released: 10/17/2015 Document Revised: 06/09/2016 Document Reviewed: 07/22/2015 Elsevier Interactive Patient Education  2017 [place] Prevention in the Home Falls can cause injuries. They can happen to people of all ages. There are many things you can do to make your home safe and to help prevent falls. What can I do on the outside of my home?  Regularly fix the edges of walkways and driveways and fix any cracks.  Remove anything that might make you trip as you walk through a door, such as a raised step or threshold.  Trim any bushes or trees on the path to your home.  Use bright outdoor lighting.  Clear any walking paths of anything that might make someone trip, such as rocks or tools.  Regularly check to see if handrails are loose or broken. Make sure that both sides of any steps have handrails.  Any raised decks and porches should have guardrails on the edges.  Have any leaves, snow, or ice cleared regularly.  Use sand or salt on walking paths during winter.  Clean up any spills in your garage right away. This includes oil or grease spills. What can I do in the bathroom?  Use night lights.  Install grab bars by the toilet and in the tub and shower. Do not use towel bars as grab bars.  Use non-skid mats or decals in the tub or shower.  If you need to sit down in the shower, use a plastic, non-slip stool.  Keep the floor dry. Clean up any water that spills on the floor as soon as it happens.  Remove soap buildup in the tub or shower regularly.  Attach bath mats securely with double-sided non-slip rug tape.  Do not have throw rugs  and other things on the floor that can make you trip. What can I do in the bedroom?  Use night lights.  Make sure that you have a light by your bed that is easy to reach.  Do not use any sheets or blankets that are too big for your bed. They should not hang down onto the floor.  Have a firm chair that has side arms. You can use this for support while you get dressed.  Do not have throw rugs and other things on the floor that can make you trip. What can I do in the kitchen?  Clean up any spills right away.  Avoid walking on wet floors.  Keep items that you use a lot in easy-to-reach places.  If you need to reach something above you, use a strong step stool that has a grab bar.  Keep electrical cords out of the way.  Do not use floor polish or wax that makes floors slippery. If you must use wax, use non-skid floor wax.  Do not have throw rugs and other things on the floor that can make you trip. What can I do with my stairs?  Do not leave any items  on the stairs.  Make sure that there are handrails on both sides of the stairs and use them. Fix handrails that are broken or loose. Make sure that handrails are as long as the stairways.  Check any carpeting to make sure that it is firmly attached to the stairs. Fix any carpet that is loose or worn.  Avoid having throw rugs at the top or bottom of the stairs. If you do have throw rugs, attach them to the floor with carpet tape.  Make sure that you have a light switch at the top of the stairs and the bottom of the stairs. If you do not have them, ask someone to add them for you. What else can I do to help prevent falls?  Wear shoes that:  Do not have high heels.  Have rubber bottoms.  Are comfortable and fit you well.  Are closed at the toe. Do not wear sandals.  If you use a stepladder:  Make sure that it is fully opened. Do not climb a closed stepladder.  Make sure that both sides of the stepladder are locked into  place.  Ask someone to hold it for you, if possible.  Clearly mark and make sure that you can see:  Any grab bars or handrails.  First and last steps.  Where the edge of each step is.  Use tools that help you move around (mobility aids) if they are needed. These include:  Canes.  Walkers.  Scooters.  Crutches.  Turn on the lights when you go into a dark area. Replace any light bulbs as soon as they burn out.  Set up your furniture so you have a clear path. Avoid moving your furniture around.  If any of your floors are uneven, fix them.  If there are any pets around you, be aware of where they are.  Review your medicines with your doctor. Some medicines can make you feel dizzy. This can increase your chance of falling. Ask your doctor what other things that you can do to help prevent falls. This information is not intended to replace advice given to you by your health care provider. Make sure you discuss any questions you have with your health care provider. Document Released: 07/17/2009 Document Revised: 02/26/2016 Document Reviewed: 10/25/2014 Elsevier Interactive Patient Education  2017 [employer].

## 2018-12-14 NOTE — Progress Notes (Signed)
Subjective:     Patient ID: Mary Krueger , female    DOB: 1945/12/30 , 73 y.o.   MRN: [id number]   Chief Complaint  Patient presents with  . Hypertension    HPI Pt. Is here for FU HTN and hyperlipidemia. She states she could not tolerate Livalol after taking it twice a week for 3 weeks due to muscles aches. While on it has not been taking coQ10. She has appointment to have [facility]. She had a question about Repatha and wonders if she would be able to get this. She has history of high cholesterol since young. Does not eat much meat, but has never followed a vegetarian diet. She does not exercise, but is very active.   Past Medical History:  Diagnosis Date  . Anxiety    Chronic  . Breast mass, left 2005   fatty on U/S  . Cataract   . Fibroid 1987   TAH/USO  . Glaucoma   . Hyperlipidemia   . Uses hearing aid    Bilateral   . Vitamin D deficiency      Family History  Problem Relation Age of Onset  . Diabetes Mother   . Prostate cancer Father   . Colon cancer Father   . Diabetes Brother   . Prostate cancer Brother   . Colon polyps Neg Hx   . Esophageal cancer Neg Hx   . Stomach cancer Neg Hx   . Rectal cancer Neg Hx      Current Outpatient Medications:  .  Cholecalciferol (VITAMIN D PO), Take 4,000 Int'l Units by mouth daily., Disp: , Rfl:  .  dorzolamide (TRUSOPT) 2 % ophthalmic solution, , Disp: , Rfl:  .  meloxicam (MOBIC) 15 MG tablet, Take 15 mg by mouth daily., Disp: , Rfl:  .  metoprolol succinate (TOPROL-XL) 25 MG 24 hr tablet, TAKE ONE TABLET BY MOUTH EVERY EVENING, Disp: 90 tablet, Rfl: 0 .  PARoxetine (PAXIL) 40 MG tablet, Take 1 tablet (40 mg total) by mouth daily., Disp: 90 tablet, Rfl: 4 .  Pitavastatin Calcium (LIVALO) 2 MG TABS, Take by mouth., Disp: , Rfl:  .  tretinoin (RETIN-A) 0.05 % cream, Apply topically at bedtime., Disp: 45 g, Rfl: 0   Allergies  Allergen Reactions  . Atorvastatin Other (See Comments)     myalgias  . Rosuvastatin Other (See Comments)    myalgias     Review of Systems  Constitutional: Negative for chills, diaphoresis, fatigue and fever.  HENT: Negative for congestion.   Eyes: Negative for visual disturbance.  Respiratory: Negative for cough, chest tightness and shortness of breath.   Cardiovascular: Negative for chest pain, palpitations and leg swelling.  Gastrointestinal: Negative for abdominal pain, blood in stool, constipation, diarrhea, nausea and vomiting.  Endocrine: Negative for polydipsia and polyphagia.  Genitourinary: Negative for difficulty urinating.  Skin: Negative for rash.  Neurological: Negative for dizziness, light-headedness, numbness and headaches.     Today's Vitals   12/14/18 1221  BP: 122/70  Pulse: 72  Temp: 97.7 F (36.5 C)  TempSrc: Oral  SpO2: 98%  Weight: 137 lb 9.6 oz (62.4 kg)  Height: 5' 3.6" (1.615 m)   Body mass index is 23.92 kg/m.   Objective:  Physical Exam   Constitutional: She is oriented to person, place, and time. She appears well-developed and well-nourished. No distress.  HENT:  Head: Normocephalic and atraumatic.  Right Ear: External ear normal.  Left Ear: External ear normal.  Nose: Nose normal.  Eyes: Conjunctivae are normal. Right eye exhibits no discharge. Left eye exhibits no discharge. No scleral icterus.  Neck: Neck supple. No thyromegaly present.  No carotid bruits bilaterally  Cardiovascular: Normal rate and regular rhythm.  No murmur heard. Pulmonary/Chest: Effort normal and breath sounds normal. No respiratory distress.  Musculoskeletal: Normal range of motion. She exhibits no edema.  Lymphadenopathy:    She has no cervical adenopathy.  Neurological: She is alert and oriented to person, place, and time.  Skin: Skin is warm and dry. Capillary refill takes less than 2 seconds. No rash noted. She is not diaphoretic.  Psychiatric: She has a normal mood and affect. Her behavior is normal. Judgment  and thought content normal.  Nursing note reviewed.     Assessment And Plan:     1. Pure hypercholesterolemia- chronic and unable to tolerate Statins. I educated her of the reason why her muscles may ache and maybe she could try coQ10 supplementation first for a few weeks and then re-start the [place] and see if she tolerates this better. I did ask her that would be a good idea to check her muscle enzymes and liver enzymes a couple of weeks after, and continue to monitor her symptoms and those same labs.  She is willing to see cardiology if the carotid duplex is positive and cant tolerate the Livalol to consider getting on the Repatha, since we dont prescribe this med here yet.   2. Essential hypertension- stable. May continue same medication.  Fu in 6 months with Dr Baird Cancer.   Lexxie Winberg RODRIGUEZ-SOUTH[place], PA-C

## 2018-12-14 NOTE — Progress Notes (Signed)
Subjective:   Mary Krueger is a 73 y.o. female who presents for Medicare Annual (Subsequent) preventive examination.  Review of Systems:  n/a Cardiac Risk Factors include: advanced age (>52men, >8 women);dyslipidemia;sedentary lifestyle     Objective:     Vitals: BP 122/70 (BP Location: Left Arm, Patient Position: Sitting)   Pulse 72   Temp 97.7 F (36.5 C) (Oral)   Ht 5' 3.6" (1.615 m)   Wt 137 lb 9.6 oz (62.4 kg)   LMP 10/04/1984   SpO2 98%   BMI 23.92 kg/m   Body mass index is 23.92 kg/m.  Advanced Directives 12/14/2018  Does Patient Have a Medical Advance Directive? Yes  Type of Advance Directive Living will  Does patient want to make changes to medical advance directive? No - Patient declined    Tobacco Social History   Tobacco Use  Smoking Status Former Smoker  Smokeless Tobacco Never Used  Tobacco Comment   quit in 1981     Counseling given: Not Answered Comment: quit in 1981   Clinical Intake:  Pre-visit preparation completed: Yes  Pain : 0-10 Pain Score: 5  Pain Type: Chronic pain Pain Location: Knee Pain Orientation: Left Pain Descriptors / Indicators: Aching Pain Onset: More than a month ago Pain Frequency: Intermittent     Nutritional Status: BMI of 19-24  Normal Nutritional Risks: None Diabetes: No  How often do you need to have someone help you when you read instructions, pamphlets, or other written materials from your doctor or pharmacy?: 1 - Never     Information entered by :: NAllen LPN  Past Medical History:  Diagnosis Date  . Anxiety    Chronic  . Breast mass, left 2005   fatty on U/S  . Cataract   . Fibroid 1987   TAH/USO  . Glaucoma   . Hyperlipidemia   . Uses hearing aid    Bilateral   . Vitamin D deficiency    Past Surgical History:  Procedure Laterality Date  . CATARACT EXTRACTION W/ INTRAOCULAR LENS IMPLANT Bilateral 12/2015 01/2016  . COSMETIC SURGERY     Mini face lift  . EYE SURGERY  06/2012   eye lift  . LIPOMA EXCISION Left 2009   left arm  . STOMACH SURGERY  1997   Sphincter  . TONSILLECTOMY AND ADENOIDECTOMY  1972  . TOTAL ABDOMINAL HYSTERECTOMY W/ BILATERAL SALPINGOOPHORECTOMY    . TRIGGER FINGER RELEASE     right   Family History  Problem Relation Age of Onset  . Diabetes Mother   . Prostate cancer Father   . Colon cancer Father   . Diabetes Brother   . Prostate cancer Brother   . Colon polyps Neg Hx   . Esophageal cancer Neg Hx   . Stomach cancer Neg Hx   . Rectal cancer Neg Hx    Social History   Socioeconomic History  . Marital status: Divorced    Spouse name: Not on file  . Number of children: Not on file  . Years of education: Not on file  . Highest education level: Not on file  Occupational History  . Occupation: retired  Scientific laboratory technician  . Financial resource strain: Not hard at all  . Food insecurity:    Worry: Never true    Inability: Never true  . Transportation needs:    Medical: No    Non-medical: No  Tobacco Use  . Smoking status: Former Research scientist (life sciences)  . Smokeless tobacco: Never Used  . Tobacco  comment: quit in 1981  Substance and Sexual Activity  . Alcohol use: No    Alcohol/week: 0.0 standard drinks  . Drug use: No  . Sexual activity: Yes    Partners: Male  Lifestyle  . Physical activity:    Days per week: 0 days    Minutes per session: 0 min  . Stress: Not at all  Relationships  . Social connections:    Talks on phone: Not on file    Gets together: Not on file    Attends religious service: Not on file    Active member of club or organization: Not on file    Attends meetings of clubs or organizations: Not on file    Relationship status: Not on file  Other Topics Concern  . Not on file  Social History Narrative   Lives alone.      Outpatient Encounter Medications as of 12/14/2018  Medication Sig  . Cholecalciferol (VITAMIN D PO) Take 4,000 Int'l Units by mouth daily.  . dorzolamide (TRUSOPT) 2 % ophthalmic solution   .  meloxicam (MOBIC) 15 MG tablet Take 15 mg by mouth daily.  . metoprolol succinate (TOPROL-XL) 25 MG 24 hr tablet TAKE ONE TABLET BY MOUTH EVERY EVENING  . PARoxetine (PAXIL) 40 MG tablet Take 1 tablet (40 mg total) by mouth daily.  . Pitavastatin Calcium (LIVALO) 2 MG TABS Take by mouth.  . tretinoin (RETIN-A) 0.05 % cream Apply topically at bedtime.   No facility-administered encounter medications on file as of 12/14/2018.     Activities of Daily Living In your present state of health, do you have any difficulty performing the following activities: 12/14/2018  Hearing? Y  Comment wears hearing aides  Vision? N  Difficulty concentrating or making decisions? N  Walking or climbing stairs? N  Dressing or bathing? N  Doing errands, shopping? N  Preparing Food and eating ? N  Using the Toilet? N  In the past six months, have you accidently leaked urine? N  Do you have problems with loss of bowel control? N  Managing your Medications? N  Managing your Finances? N  Some recent data might be hidden    Patient Care Team: Glendale Chard, MD as PCP - General (Internal Medicine)    Assessment:   This is a routine wellness examination for Mary Krueger.  Exercise Activities and Dietary recommendations Current Exercise Habits: The patient does not participate in regular exercise at present, Exercise limited by: None identified  Goals   None     Fall Risk Fall Risk  12/14/2018  Falls in the past year? 0  Risk for fall due to : Medication side effect  Follow up Falls prevention discussed   Is the patient's home free of loose throw rugs in walkways, pet beds, electrical cords, etc?   yes      Grab bars in the bathroom? no      Handrails on the stairs?   yes      Adequate lighting?   yes  Timed Get Up and Go performed: n/a  Depression Screen PHQ 2/9 Scores 12/14/2018  PHQ - 2 Score 3  PHQ- 9 Score 4     Cognitive Function        Immunization History  Administered Date(s)  Administered  . Hep A / Hep B 08/23/2017  . Influenza-Unspecified 07/30/2018  . Zoster Recombinat (Shingrix) 07/22/2017    Qualifies for Shingles Vaccine? yes  Screening Tests Health Maintenance  Topic Date Due  . PNA  vac Low Risk Adult (1 of 2 - PCV13) 02/18/2009  . MAMMOGRAM  10/23/2020  . TETANUS/TDAP  05/26/2023  . COLONOSCOPY  12/14/2027  . INFLUENZA VACCINE  Completed  . DEXA SCAN  Completed  . Hepatitis C Screening  Completed    Cancer Screenings: Lung: Low Dose CT Chest recommended if Age 74-80 years, 30 pack-year currently smoking OR have quit w/in 15years. Patient does not qualify. Breast:  Up to date on Mammogram? Yes   Up to date of Bone Density/Dexa? Yes Colorectal: up to date  Additional Screenings: : Hepatitis C Screening: 03/19/2013 <0.1     Plan:    Wants to get a shot for cholesterol rather than take the pills.   I have personally reviewed and noted the following in the patient's chart:   . Medical and social history . Use of alcohol, tobacco or illicit drugs  . Current medications and supplements . Functional ability and status . Nutritional status . Physical activity . Advanced directives . List of other physicians . Hospitalizations, surgeries, and ER visits in previous 12 months . Vitals . Screenings to include cognitive, depression, and falls . Referrals and appointments  In addition, I have reviewed and discussed with patient certain preventive protocols, quality metrics, and best practice recommendations. A written personalized care plan for preventive services as well as general preventive health recommendations were provided to patient.     Kellie Simmering, LPN  [id number]/56/[id number]

## 2018-12-15 DIAGNOSIS — E7800 Pure hypercholesterolemia, unspecified: Secondary | ICD-10-CM | POA: Diagnosis not present

## 2018-12-15 DIAGNOSIS — R7309 Other abnormal glucose: Secondary | ICD-10-CM | POA: Diagnosis not present

## 2018-12-15 NOTE — Addendum Note (Signed)
Addended by: Nicki Guadalajara on: 12/15/2018 08:47 AM   Modules accepted: Orders

## 2018-12-16 LAB — CBC
Hematocrit: 39.5 % (ref 34.0–46.6)
Hemoglobin: 13.8 g/dL (ref 11.1–15.9)
MCH: 33.2 pg — AB (ref 26.6–33.0)
MCHC: 34.9 g/dL (ref 31.5–35.7)
MCV: 95 fL (ref 79–97)
Platelets: 305 10*3/uL (ref 150–450)
RBC: 4.16 x10E6/uL (ref 3.77–5.28)
RDW: 12.2 % (ref 11.7–15.4)
WBC: 5.9 10*3/uL (ref 3.4–10.8)

## 2018-12-16 LAB — LIPID PANEL
CHOLESTEROL TOTAL: 284 mg/dL — AB (ref 100–199)
Chol/HDL Ratio: 4.7 ratio — ABNORMAL HIGH (ref 0.0–4.4)
HDL: 61 mg/dL (ref >39)
LDL Calculated: 201 mg/dL — ABNORMAL HIGH (ref 0–99)
Triglycerides: 109 mg/dL (ref 0–149)
VLDL CHOLESTEROL CAL: 22 mg/dL (ref 5–40)

## 2018-12-16 LAB — CMP14 + ANION GAP
A/G RATIO: 1.9 (ref 1.2–2.2)
ALT: 11 IU/L (ref 0–32)
AST: 18 IU/L (ref 0–40)
Albumin: 4.3 g/dL (ref 3.7–4.7)
Alkaline Phosphatase: 64 IU/L (ref 39–117)
Anion Gap: 14 mmol/L (ref 10.0–18.0)
BILIRUBIN TOTAL: 0.3 mg/dL (ref 0.0–1.2)
BUN/Creatinine Ratio: 20 (ref 12–28)
BUN: 19 mg/dL (ref 8–27)
CO2: 29 mmol/L (ref 20–29)
Calcium: 9.5 mg/dL (ref 8.7–10.3)
Chloride: 100 mmol/L (ref 96–106)
Creatinine, Ser: 0.93 mg/dL (ref 0.57–1.00)
GFR calc Af Amer: 70 mL/min/{1.73_m2} (ref >59)
GFR calc non Af Amer: 61 mL/min/{1.73_m2} (ref >59)
Globulin, Total: 2.3 g/dL (ref 1.5–4.5)
Glucose: 82 mg/dL (ref 65–99)
POTASSIUM: 4.5 mmol/L (ref 3.5–5.2)
SODIUM: 143 mmol/L (ref 134–144)
Total Protein: 6.6 g/dL (ref 6.0–8.5)

## 2018-12-16 LAB — HEMOGLOBIN A1C
Est. average glucose Bld gHb Est-mCnc: 108 mg/dL
Hgb A1c MFr Bld: 5.4 % (ref 4.8–5.6)

## 2019-01-02 ENCOUNTER — Other Ambulatory Visit: Payer: Self-pay | Admitting: Internal Medicine

## 2019-01-03 HISTORY — PX: ESOPHAGOGASTRODUODENOSCOPY: SHX1529

## 2019-01-16 ENCOUNTER — Telehealth: Payer: Self-pay

## 2019-01-16 NOTE — Telephone Encounter (Signed)
Patient called stating she has something stuff in her throat and she is unable to drink water. She would like an appointment.  RETURNED PT CALL AND LEFT HER A V/M TO CALL OFFICE SO WE CAN GET FURTHER DETAIL TO WHAT SHE MEANS BECAUSE SHE MAY NEED TO GO TO ER SO THEY CAN EVALUATE HER DUE TO [place] NOT BEING ABLE TO DO ANYTHING. Lonia Mad

## 2019-01-17 ENCOUNTER — Encounter (HOSPITAL_COMMUNITY)
Admission: EM | Disposition: A | Discharge status: Home / Self Care | Payer: Self-pay | Source: Home / Self Care | Attending: Emergency Medicine

## 2019-01-17 ENCOUNTER — Emergency Department (HOSPITAL_COMMUNITY): Payer: Medicare HMO | Admitting: Certified Registered"

## 2019-01-17 ENCOUNTER — Ambulatory Visit (HOSPITAL_COMMUNITY)
Admission: EM | Admit: 2019-01-17 | Discharge: 2019-01-17 | Disposition: A | Discharge status: Home / Self Care | Payer: Medicare HMO | Attending: Internal Medicine | Admitting: Internal Medicine

## 2019-01-17 ENCOUNTER — Emergency Department (HOSPITAL_COMMUNITY): Payer: Medicare HMO

## 2019-01-17 ENCOUNTER — Encounter (HOSPITAL_COMMUNITY): Payer: Self-pay

## 2019-01-17 ENCOUNTER — Other Ambulatory Visit: Payer: Self-pay

## 2019-01-17 DIAGNOSIS — Z87891 Personal history of nicotine dependence: Secondary | ICD-10-CM | POA: Diagnosis not present

## 2019-01-17 DIAGNOSIS — R198 Other specified symptoms and signs involving the digestive system and abdomen: Secondary | ICD-10-CM | POA: Diagnosis not present

## 2019-01-17 DIAGNOSIS — Z8042 Family history of malignant neoplasm of prostate: Secondary | ICD-10-CM | POA: Insufficient documentation

## 2019-01-17 DIAGNOSIS — Z90710 Acquired absence of both cervix and uterus: Secondary | ICD-10-CM | POA: Insufficient documentation

## 2019-01-17 DIAGNOSIS — E785 Hyperlipidemia, unspecified: Secondary | ICD-10-CM | POA: Insufficient documentation

## 2019-01-17 DIAGNOSIS — K209 Esophagitis, unspecified: Secondary | ICD-10-CM

## 2019-01-17 DIAGNOSIS — W44F3XA Food entering into or through a natural orifice, initial encounter: Secondary | ICD-10-CM

## 2019-01-17 DIAGNOSIS — Z9841 Cataract extraction status, right eye: Secondary | ICD-10-CM | POA: Diagnosis not present

## 2019-01-17 DIAGNOSIS — R000 Tachycardia, unspecified: Secondary | ICD-10-CM | POA: Diagnosis not present

## 2019-01-17 DIAGNOSIS — T18128A Food in esophagus causing other injury, initial encounter: Secondary | ICD-10-CM | POA: Insufficient documentation

## 2019-01-17 DIAGNOSIS — Z9842 Cataract extraction status, left eye: Secondary | ICD-10-CM | POA: Diagnosis not present

## 2019-01-17 DIAGNOSIS — R1310 Dysphagia, unspecified: Secondary | ICD-10-CM | POA: Diagnosis not present

## 2019-01-17 DIAGNOSIS — H409 Unspecified glaucoma: Secondary | ICD-10-CM | POA: Insufficient documentation

## 2019-01-17 DIAGNOSIS — X58XXXA Exposure to other specified factors, initial encounter: Secondary | ICD-10-CM | POA: Insufficient documentation

## 2019-01-17 DIAGNOSIS — F419 Anxiety disorder, unspecified: Secondary | ICD-10-CM | POA: Insufficient documentation

## 2019-01-17 DIAGNOSIS — H9193 Unspecified hearing loss, bilateral: Secondary | ICD-10-CM | POA: Insufficient documentation

## 2019-01-17 DIAGNOSIS — Z833 Family history of diabetes mellitus: Secondary | ICD-10-CM | POA: Diagnosis not present

## 2019-01-17 DIAGNOSIS — Z791 Long term (current) use of non-steroidal anti-inflammatories (NSAID): Secondary | ICD-10-CM | POA: Diagnosis not present

## 2019-01-17 DIAGNOSIS — Z79899 Other long term (current) drug therapy: Secondary | ICD-10-CM | POA: Diagnosis not present

## 2019-01-17 DIAGNOSIS — Z800 Family history of malignant neoplasm of digestive organs: Secondary | ICD-10-CM | POA: Diagnosis not present

## 2019-01-17 DIAGNOSIS — R69 Illness, unspecified: Secondary | ICD-10-CM | POA: Diagnosis not present

## 2019-01-17 DIAGNOSIS — E559 Vitamin D deficiency, unspecified: Secondary | ICD-10-CM | POA: Insufficient documentation

## 2019-01-17 DIAGNOSIS — Z961 Presence of intraocular lens: Secondary | ICD-10-CM | POA: Insufficient documentation

## 2019-01-17 HISTORY — PX: ESOPHAGOGASTRODUODENOSCOPY (EGD) WITH PROPOFOL: SHX5813

## 2019-01-17 HISTORY — PX: FOREIGN BODY REMOVAL: SHX962

## 2019-01-17 LAB — COMPREHENSIVE METABOLIC PANEL
ALT: 18 U/L (ref 0–44)
AST: 20 U/L (ref 15–41)
Albumin: 4.1 g/dL (ref 3.5–5.0)
Alkaline Phosphatase: 53 U/L (ref 38–126)
Anion gap: 10 (ref 5–15)
BUN: 21 mg/dL (ref 8–23)
CO2: 25 mmol/L (ref 22–32)
Calcium: 9.6 mg/dL (ref 8.9–10.3)
Chloride: 105 mmol/L (ref 98–111)
Creatinine, Ser: 0.83 mg/dL (ref 0.44–1.00)
GFR calc Af Amer: >60 mL/min (ref >60)
GFR calc non Af Amer: >60 mL/min (ref >60)
Glucose, Bld: 97 mg/dL (ref 70–99)
Potassium: 3.8 mmol/L (ref 3.5–5.1)
Sodium: 140 mmol/L (ref 135–145)
Total Bilirubin: 0.8 mg/dL (ref 0.3–1.2)
Total Protein: 7.1 g/dL (ref 6.5–8.1)

## 2019-01-17 LAB — CBC
HCT: 44.3 % (ref 36.0–46.0)
Hemoglobin: 14.5 g/dL (ref 12.0–15.0)
MCH: 30.6 pg (ref 26.0–34.0)
MCHC: 32.7 g/dL (ref 30.0–36.0)
MCV: 93.5 fL (ref 80.0–100.0)
Platelets: 292 10*3/uL (ref 150–400)
RBC: 4.74 MIL/uL (ref 3.87–5.11)
RDW: 11.9 % (ref 11.5–15.5)
WBC: 7.8 10*3/uL (ref 4.0–10.5)
nRBC: 0 % (ref 0.0–0.2)

## 2019-01-17 SURGERY — ESOPHAGOGASTRODUODENOSCOPY (EGD) WITH PROPOFOL
Anesthesia: General

## 2019-01-17 SURGERY — EGD (ESOPHAGOGASTRODUODENOSCOPY)
Anesthesia: General

## 2019-01-17 MED ORDER — SODIUM CHLORIDE 0.9 % IV SOLN
INTRAVENOUS | Status: DC | PRN
  Administered 2019-01-17: 25 ug/min via INTRAVENOUS

## 2019-01-17 MED ORDER — FENTANYL CITRATE (PF) 100 MCG/2ML IJ SOLN
INTRAMUSCULAR | Status: DC | PRN
  Administered 2019-01-17: 100 ug via INTRAVENOUS

## 2019-01-17 MED ORDER — GLUCAGON HCL RDNA (DIAGNOSTIC) 1 MG IJ SOLR
2.0000 mg | Freq: Once | INTRAMUSCULAR | Status: AC
  Administered 2019-01-17: 2 mg via INTRAVENOUS
  Filled 2019-01-17: qty 2

## 2019-01-17 MED ORDER — DEXAMETHASONE SODIUM PHOSPHATE 10 MG/ML IJ SOLN
INTRAMUSCULAR | Status: DC | PRN
  Administered 2019-01-17: 5 mg via INTRAVENOUS

## 2019-01-17 MED ORDER — LACTATED RINGERS IV SOLN
INTRAVENOUS | Status: AC | PRN
  Administered 2019-01-17: 1000 mL via INTRAVENOUS

## 2019-01-17 MED ORDER — PROPOFOL 10 MG/ML IV BOLUS
INTRAVENOUS | Status: DC | PRN
  Administered 2019-01-17: 180 mg via INTRAVENOUS

## 2019-01-17 MED ORDER — SUCCINYLCHOLINE CHLORIDE 200 MG/10ML IV SOSY
PREFILLED_SYRINGE | INTRAVENOUS | Status: DC | PRN
  Administered 2019-01-17: 130 mg via INTRAVENOUS

## 2019-01-17 MED ORDER — LIDOCAINE 2% (20 MG/ML) 5 ML SYRINGE
INTRAMUSCULAR | Status: DC | PRN
  Administered 2019-01-17: 50 mg via INTRAVENOUS

## 2019-01-17 MED ORDER — ONDANSETRON HCL 4 MG/2ML IJ SOLN
INTRAMUSCULAR | Status: DC | PRN
  Administered 2019-01-17: 4 mg via INTRAVENOUS

## 2019-01-17 MED ORDER — PHENYLEPHRINE 40 MCG/ML (10ML) SYRINGE FOR IV PUSH (FOR BLOOD PRESSURE SUPPORT)
PREFILLED_SYRINGE | INTRAVENOUS | Status: DC | PRN
  Administered 2019-01-17: 120 ug via INTRAVENOUS
  Administered 2019-01-17: 80 ug via INTRAVENOUS
  Administered 2019-01-17: 120 ug via INTRAVENOUS
  Administered 2019-01-17: 80 ug via INTRAVENOUS

## 2019-01-17 MED ORDER — SODIUM CHLORIDE 0.9 % IV BOLUS
1000.0000 mL | Freq: Once | INTRAVENOUS | Status: AC
  Administered 2019-01-17: 1000 mL via INTRAVENOUS

## 2019-01-17 SURGICAL SUPPLY — 15 items

## 2019-01-17 NOTE — Anesthesia Postprocedure Evaluation (Signed)
Anesthesia Post Note  Patient: Mary Krueger  Procedure(s) Performed: ESOPHAGOGASTRODUODENOSCOPY (EGD) WITH PROPOFOL (N/A ) FOREIGN BODY REMOVAL     Patient location during evaluation: Endoscopy Anesthesia Type: General Level of consciousness: awake and alert Pain management: pain level controlled Vital Signs Assessment: post-procedure vital signs reviewed and stable Respiratory status: spontaneous breathing, nonlabored ventilation and respiratory function stable Cardiovascular status: blood pressure returned to baseline and stable Postop Assessment: no apparent nausea or vomiting Anesthetic complications: no    Last Vitals:  Vitals:   01/17/19 1844 01/17/19 1854  BP: (!) 175/65 (!) 161/61  Pulse: 99 96  Resp: 20 10  Temp:    SpO2: 100% 98%    Last Pain:  Vitals:   01/17/19 1854  TempSrc:   PainSc: 0-No pain                 Wilhelmena Zea,W. EDMOND

## 2019-01-17 NOTE — Consult Note (Signed)
Consultation  Referring Provider: [facility] - Dr Ellender Hose Primary Care Physician:  Glendale Chard, MD Primary Gastroenterologist:  Dr.Nandigam  Reason for Consultation:  Food impaction  HPI: Mary Krueger is a 73 y.o. female, known to Dr. Silverio Decamp  from previous colonoscopy done in 2019 and showing moderate diverticulosis, no polyps. Patient presented to the emergency room earlier today with complaints of food impaction.  She says she has had multiple occurrences over the past 30 years has not had any problems for some time after she had surgery at [facility] on her esophagus.  She cannot recall she had the surgery but says it was probably 20 years ago.  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 medically required surgery.  She is not familiar with the term achalasia.  She has not had any problems with ongoing heartburn or indigestion.  She says that she ate chicken on Sunday and got a piece of chicken lodged in her esophagus.  When this has happened in the past she says generally if she waits several hours the food will eventually pass.  She tried to wait as long as she could prior to coming to the emergency room but has not had anything to eat or drink since Sunday evening.  She has been able to swallow her saliva ,she has some mild discomfort in the subxiphoid area.  No chronic anticoagulation Chronic PPI therapy  I cannot find any pertinent information and care everywhere, past history dates that she had a surgery on her esophagus in 1997.   Past Medical History:  Diagnosis Date  . Anxiety    Chronic  . Breast mass, left 2005   fatty on U/S  . Cataract   . Fibroid 1987   TAH/USO  . Glaucoma   . Hyperlipidemia   . Uses hearing aid    Bilateral   . Vitamin D deficiency     Past Surgical History:  Procedure Laterality Date  . CATARACT EXTRACTION W/ INTRAOCULAR LENS IMPLANT Bilateral  12/2015 01/2016  . COSMETIC SURGERY     Mini face lift  . EYE SURGERY  06/2012   eye lift  . LIPOMA EXCISION Left 2009   left arm  . STOMACH SURGERY  1997   Sphincter  . TONSILLECTOMY AND ADENOIDECTOMY  1972  . TOTAL ABDOMINAL HYSTERECTOMY W/ BILATERAL SALPINGOOPHORECTOMY    . TRIGGER FINGER RELEASE     right    Prior to Admission medications   Medication Sig Start Date End Date Taking? Authorizing Provider  Cholecalciferol (VITAMIN D) 125 MCG (5000 UT) CAPS Take 1 capsule by mouth daily for 30 days, THEN 1 capsule daily for 30 days. 12/14/18 02/12/19  Rodriguez-Southworth, Sunday Spillers, PA-C  dorzolamide (TRUSOPT) 2 % ophthalmic solution  06/08/16   [provider]  meloxicam (MOBIC) 15 MG tablet Take 15 mg by mouth daily.    [provider]  metoprolol succinate (TOPROL-XL) 25 MG 24 hr tablet TAKE ONE TABLET BY MOUTH EVERY EVENING 01/02/19   Glendale Chard, MD  PARoxetine (PAXIL) 40 MG tablet Take 1 tablet (40 mg total) by mouth daily. 10/05/17   Megan Salon, MD  Pitavastatin Calcium (LIVALO) 2 MG TABS Take by mouth.    [provider]  tretinoin (RETIN-A) 0.05 % cream Apply topically at bedtime. 10/06/18   Megan Salon, MD    No current facility-administered medications for this encounter.    Current Outpatient Medications  Medication Sig Dispense Refill  . Cholecalciferol (VITAMIN D) 125 MCG (5000 UT) CAPS Take 1 capsule by mouth daily for 30 days, THEN 1 capsule daily for 30 days. 1 capsule 0  . dorzolamide (TRUSOPT) 2 % ophthalmic solution     . meloxicam (MOBIC) 15 MG tablet Take 15 mg by mouth daily.    . metoprolol succinate (TOPROL-XL) 25 MG 24 hr tablet TAKE ONE TABLET BY MOUTH EVERY EVENING 90 tablet 0  . PARoxetine (PAXIL) 40 MG tablet Take 1 tablet (40 mg total) by mouth daily. 90 tablet 4  . Pitavastatin Calcium (LIVALO) 2 MG TABS Take by mouth.    . tretinoin (RETIN-A) 0.05 % cream Apply topically at bedtime. 45 g 0    Allergies as of 01/17/2019 -  Review Complete 01/17/2019  Allergen Reaction Noted  . Atorvastatin Other (See Comments) 10/19/2016  . Rosuvastatin Other (See Comments) 10/19/2016    Family History  Problem Relation Age of Onset  . Diabetes Mother   . Prostate cancer Father   . Colon cancer Father   . Diabetes Brother   . Prostate cancer Brother   . Colon polyps Neg Hx   . Esophageal cancer Neg Hx   . Stomach cancer Neg Hx   . Rectal cancer Neg Hx     Social History   Socioeconomic History  . Marital status: Divorced    Spouse name: Not on file  . Number of children: Not on file  . Years of education: Not on file  . Highest education level: Not on file  Occupational History  . Occupation: retired  Scientific laboratory technician  . Financial resource strain: Not hard at all  . Food insecurity:    Worry: Never true    Inability: Never true  . Transportation needs:    Medical: No    Non-medical: No  Tobacco Use  . Smoking status: Former Research scientist (life sciences)  . Smokeless tobacco: Never Used  . Tobacco comment: quit in 1981  Substance and Sexual Activity  . Alcohol use: No    Alcohol/week: 0.0 standard drinks  . Drug use: No  . Sexual activity: Yes    Partners: Male  Lifestyle  . Physical activity:    Days per week: 0 days    Minutes per session: 0 min  . Stress: Not at all  Relationships  . Social connections:    Talks on phone: Not on file    Gets together: Not on file    Attends religious service: Not on file    Active member of club or organization: Not on file    Attends meetings of clubs or organizations: Not on file    Relationship status: Not on file  . Intimate partner violence:    Fear of current or ex partner: Not on file    Emotionally abused: Not on file    Physically abused: Not on file    Forced sexual activity: Not on file  Other Topics Concern  . Not on file  Social History Narrative   Lives alone.      Review of Systems: Pertinent positive and negative review of systems were noted in the above  HPI section.  All other review of systems was otherwise negative.  Physical Exam: Vital signs in last 24 hours: Temp:  [97.8 F (36.6 C)] 97.8 F (36.6 C) (04/15 1104) Pulse Rate:  [89-109] 94 (04/15 1500) Resp:  [12-22] 13 (04/15 1500) BP: (144-166)/(57-99) 144/57 (04/15 1500) SpO2:  [99 %-100 %] 100 % (  04/15 1500)   General:   Alert,  Well-developed, well-nourished, talkative ,pleasant and cooperative in NAD Head:  Normocephalic and atraumatic. Eyes:  Sclera clear, no icterus.   Conjunctiva pink. Ears:  Normal auditory acuity. Nose:  No deformity, discharge,  or lesions. Mouth:  No deformity or lesions.   Neck:  Supple; no masses or thyromegaly. Lungs:  Clear throughout to auscultation.   No wheezes, crackles, or rhonchi.  Heart:  Regular rate and rhythm; no murmurs, clicks, rubs,  or gallops. Abdomen:  Soft,nontender, BS active,nonpalp mass or hsm.   Rectal:  Deferred  Msk:  Symmetrical without gross deformities. . Pulses:  Normal pulses noted. Extremities:  Without clubbing or edema. Neurologic:  Alert and  oriented x4;  grossly normal neurologically. Skin:  Intact without significant lesions or rashes.. Psych:  Alert and cooperative. Normal mood and affect.  Intake/Output from previous day: No intake/output data recorded. Intake/Output this shift: Total I/O In: 1000 [IV Piggyback:1000] Out: -   Lab Results: Recent Labs    01/17/19 1308  WBC 7.8  HGB 14.5  HCT 44.3  PLT 292   BMET Recent Labs    01/17/19 1308  NA 140  K 3.8  CL 105  CO2 25  GLUCOSE 97  BUN 21  CREATININE 0.83  CALCIUM 9.6   LFT Recent Labs    01/17/19 1308  PROT 7.1  ALBUMIN 4.1  AST 20  ALT 18  ALKPHOS 53  BILITOT 0.8   PT/INR No results for input(s): LABPROT, INR in the last 72 hours. Hepatitis Panel No results for input(s): HEPBSAG, HCVAB, HEPAIGM, HEPBIGM in the last 72 hours.   IMPRESSION:   #38 73 year old white female with esophageal food impaction 3 days  Rule  out distal esophageal stricture, suspect patient has an underlying motility disorder i.e. achalasia for which she underwent a remote surgery at [facility] which may have been a Heller myotomy  # 2 chronic anxiety  Plan; Patient will undergo upper endoscopy with removal of food impaction this afternoon with Dr. Hilarie Fredrickson Procedure was discussed in detail with the patient including indications risks and benefits and she is agreeable to proceed. Further recommendations pending findings at EGD.    Mary Cimino EsterwoodPA-C  01/17/2019, 4:24 PM

## 2019-01-17 NOTE — Transfer of Care (Signed)
Immediate Anesthesia Transfer of Care Note  Patient: Mary Krueger  Procedure(s) Performed: ESOPHAGOGASTRODUODENOSCOPY (EGD) WITH PROPOFOL (N/A )  Patient Location: Endoscopy Unit  Anesthesia Type:General  Level of Consciousness: awake and patient cooperative  Airway & Oxygen Therapy: Patient Spontanous Breathing and Patient connected to nasal cannula oxygen  Post-op Assessment: Report given to RN, Post -op Vital signs reviewed and stable and Patient moving all extremities  Post vital signs: Reviewed and stable  Last Vitals:  Vitals Value Taken Time  BP    Temp    Pulse    Resp    SpO2      Last Pain:  Vitals:   01/17/19 1636  TempSrc: Oral  PainSc: 0-No pain         Complications: No apparent anesthesia complications

## 2019-01-17 NOTE — Anesthesia Procedure Notes (Signed)
Procedure Name: Intubation Date/Time: 01/17/2019 5:15 PM Performed by: Moshe Salisbury, CRNA Pre-anesthesia Checklist: Patient identified, Emergency Drugs available, Suction available and Patient being monitored Patient Re-evaluated:Patient Re-evaluated prior to induction Oxygen Delivery Method: Circle System Utilized Preoxygenation: Pre-oxygenation with 100% oxygen Induction Type: IV induction, Rapid sequence and Cricoid Pressure applied Laryngoscope Size: Mac and 3 Grade View: Grade I Tube type: Oral Tube size: 7.5 mm Number of attempts: 1 Airway Equipment and Method: Stylet Placement Confirmation: ETT inserted through vocal cords under direct vision,  positive ETCO2 and breath sounds checked- equal and bilateral Secured at: 21 cm Tube secured with: Tape Dental Injury: Teeth and Oropharynx as per pre-operative assessment

## 2019-01-17 NOTE — Op Note (Signed)
[facility] Patient Name: Mary Krueger Procedure Date : 01/17/2019 MRN: [id number] Attending MD: Jerene Bears , MD Date of Birth: 02-05-1946 CSN: [id number] Age: 73 Admit Type: Outpatient Procedure:                Upper GI endoscopy Indications:              Dysphagia, Foreign body in the esophagus/esophageal                            food impaction Providers:                Lajuan Lines. Hilarie Fredrickson, MD, Glori Bickers, RN, Carlyn Reichert,                            RN, Elspeth Cho Tech., Technician, Ladona Ridgel, Technician, Claybon Jabs CRNA, CRNA Referring MD:             Hilda Blades, MD Medicines:                General Anesthesia Complications:            No immediate complications. Estimated Blood Loss:     Estimated blood loss: none. Procedure:                Pre-Anesthesia Assessment:                           - Prior to the procedure, a History and Physical                            was performed, and patient medications and                            allergies were reviewed. The patient's tolerance of                            previous anesthesia was also reviewed. The risks                            and benefits of the procedure and the sedation                            options and risks were discussed with the patient.                            All questions were answered, and informed consent                            was obtained. Prior Anticoagulants: The patient has                            taken no previous anticoagulant or antiplatelet  agents. ASA Grade Assessment: II - A patient with                            mild systemic disease. After reviewing the risks                            and benefits, the patient was deemed in                            satisfactory condition to undergo the procedure.                           After obtaining informed consent, the endoscope was       passed under direct vision. Throughout the                            procedure, the patient's blood pressure, pulse, and                            oxygen saturations were monitored continuously. The                            GIF-H190 ([id number]) Olympus gastroscope was                            introduced through the mouth, and advanced to the                            second part of duodenum. The upper GI endoscopy was                            accomplished without difficulty. The patient                            tolerated the procedure well. Scope In: Scope Out: Findings:      Large food impaction was found in the lower third of the esophagus.       Removal of food difficult taking multiple passes in multiple instruments       but was ultimately accomplished. The banding cap, Roth net, talon       graspers and rat-tooth forceps were used to remove the food impaction. A       majority of the meat bolus was removed via the mouth and a small amount       was advanced into the stomach.      Mild esophagitis with no bleeding was found in the lower third of the       esophagus. There was no obvious stricture but given her history the       question of hypertensive LES and achalasia remain.      The entire examined stomach was normal.      The examined duodenum was normal. Impression:               - Food impaction in the lower third of the  esophagus. Removal was successful.                           - Mild esophagitis.                           - Normal stomach.                           - Normal examined duodenum. Moderate Sedation:      N/A Recommendation:           - Patient has a contact number available for                            emergencies. The signs and symptoms of potential                            delayed complications were discussed with the                            patient. Return to normal activities tomorrow.                             Written discharge instructions were provided to the                            patient.                           - Soft diet.                           - Continue present medications.                           - Office follow-up by virtual visit with Dr.                            Silverio Decamp within the next couple of weeks. Dr.                            Silverio Decamp and the patient can discuss further                            evaluation and possible manometry and esophageal                            dilation. Procedure Code(s):        --- Professional ---                           [phone], Esophagogastroduodenoscopy, flexible,                            transoral; with removal of foreign body(s) Diagnosis Code(s):        --- Professional ---  V29.191Y, Food in esophagus causing other injury,                            initial encounter                           K20.9, Esophagitis, unspecified                           R13.10, Dysphagia, unspecified                           T18.108A, Unspecified foreign body in esophagus                            causing other injury, initial encounter CPT copyright 2019 [employer]. All rights reserved. The codes documented in this report are preliminary and upon coder review may  be revised to meet current compliance requirements. Jerene Bears, MD 01/17/2019 6:31:15 PM This report has been signed electronically. Number of Addenda: 0

## 2019-01-17 NOTE — Discharge Instructions (Addendum)
YOU HAD AN ENDOSCOPIC PROCEDURE TODAY: Refer to the procedure report that was given to you for any specific questions about what was found during the examination.  If the procedure report does not answer your questions, please call your gastroenterologist to clarify. ° °YOU SHOULD EXPECT: Some feelings of bloating in the abdomen. Passage of more gas than usual.  Walking can help get rid of the air that was put into your GI tract during the procedure and reduce the bloating. If you had a lower endoscopy (such as a colonoscopy or flexible sigmoidoscopy) you may notice spotting of blood in your stool or on the toilet paper.  ° °DIET: Your first meal following the procedure should be a light meal and then it is ok to progress to your normal diet.  A half-sandwich or bowl of soup is an example of a good first meal.  Heavy or fried foods are harder to digest and may make you feel nasueas or bloated.  Drink plenty of fluids but you should avoid alcoholic beverages for 24 hours. ° °ACTIVITY: Your care partner should take you home directly after the procedure.  You should plan to take it easy, moving slowly for the rest of the day.  You can resume normal activity the day after the procedure however you should NOT DRIVE or use heavy machinery for 24 hours (because of the sedation medicines used during the test).   ° °SYMPTOMS TO REPORT IMMEDIATELY  °A gastroenterologist can be reached at any hour.  Please call your doctor's office for any of the following symptoms: ° °· Following upper endoscopy (EGD, EUS, ERCP) ° Vomiting of blood or coffee ground material ° New, significant abdominal pain ° New, significant chest pain or pain under the shoulder blades ° Painful or persistently difficult swallowing ° New shortness of breath ° Black, tarry-looking stools ° °FOLLOW UP: °If any biopsies were taken you will be contacted by phone or by letter within the next 1-3 weeks.  Call your gastroenterologist if you have not heard about the  biopsies in 3 weeks.  °Please also call your gastroenterologist's office with any specific questions about appointments or follow up tests. ° °

## 2019-01-17 NOTE — ED Notes (Signed)
Gastro RN came to ED to transport patinet to EGD room.  Per RN pt will be discharged after procedure and not return to the ED. Pt transported to EGD by Gertie Fey RN. All belongings taken with the patient.

## 2019-01-17 NOTE — ED Provider Notes (Signed)
[facility] Emergency Department Provider Note MRN:  [id number]  Arrival date & time: 01/17/19     Chief Complaint   Foreign Body   History of Present Illness   Mary Krueger is a 73 y.o. year-old female with a history of esophageal stricture status post dilation procedures in the past presenting to the ED with chief complaint of foreign body.  Patient was eating chicken 3 days ago and felt the chicken get stuck in her epigastrium region.  This is happened multiple times in the past, usually only lasting about 20 minutes.  This sensation has been constant for 3 days.  Patient is able to swallow her own saliva but is unable to tolerate any food or drink.  Starting to feel dehydrated.  Denies fever, no chest pain or shortness of breath, no abdominal pain, no dysuria.  Symptoms are constant, worse with attempted eating or drinking.  Review of Systems  A complete 10 system review of systems was obtained and all systems are negative except as noted in the HPI and PMH.   Patient's Health History    Past Medical History:  Diagnosis Date  . Anxiety    Chronic  . Breast mass, left 2005   fatty on U/S  . Cataract   . Fibroid 1987   TAH/USO  . Glaucoma   . Hyperlipidemia   . Uses hearing aid    Bilateral   . Vitamin D deficiency     Past Surgical History:  Procedure Laterality Date  . CATARACT EXTRACTION W/ INTRAOCULAR LENS IMPLANT Bilateral 12/2015 01/2016  . COSMETIC SURGERY     Mini face lift  . EYE SURGERY  06/2012   eye lift  . LIPOMA EXCISION Left 2009   left arm  . STOMACH SURGERY  1997   Sphincter  . TONSILLECTOMY AND ADENOIDECTOMY  1972  . TOTAL ABDOMINAL HYSTERECTOMY W/ BILATERAL SALPINGOOPHORECTOMY    . TRIGGER FINGER RELEASE     right    Family History  Problem Relation Age of Onset  . Diabetes Mother   . Prostate cancer Father   . Colon cancer Father   . Diabetes Brother   . Prostate cancer Brother   . Colon polyps Neg Hx   . Esophageal  cancer Neg Hx   . Stomach cancer Neg Hx   . Rectal cancer Neg Hx     Social History   Socioeconomic History  . Marital status: Divorced    Spouse name: Not on file  . Number of children: Not on file  . Years of education: Not on file  . Highest education level: Not on file  Occupational History  . Occupation: retired  Scientific laboratory technician  . Financial resource strain: Not hard at all  . Food insecurity:    Worry: Never true    Inability: Never true  . Transportation needs:    Medical: No    Non-medical: No  Tobacco Use  . Smoking status: Former Research scientist (life sciences)  . Smokeless tobacco: Never Used  . Tobacco comment: quit in 1981  Substance and Sexual Activity  . Alcohol use: No    Alcohol/week: 0.0 standard drinks  . Drug use: No  . Sexual activity: Yes    Partners: Male  Lifestyle  . Physical activity:    Days per week: 0 days    Minutes per session: 0 min  . Stress: Not at all  Relationships  . Social connections:    Talks on phone: Not on file  Gets together: Not on file    Attends religious service: Not on file    Active member of club or organization: Not on file    Attends meetings of clubs or organizations: Not on file    Relationship status: Not on file  . Intimate partner violence:    Fear of current or ex partner: Not on file    Emotionally abused: Not on file    Physically abused: Not on file    Forced sexual activity: Not on file  Other Topics Concern  . Not on file  Social History Narrative   Lives alone.       Physical Exam  Vital Signs and Nursing Notes reviewed Vitals:   01/17/19 1415 01/17/19 1500  BP: (!) 159/71 (!) 144/57  Pulse: 97 94  Resp: (!) 22 13  Temp:    SpO2: 100% 100%    CONSTITUTIONAL: Well-appearing, NAD NEURO:  Alert and oriented x 3, no focal deficits EYES:  eyes equal and reactive ENT/NECK:  no LAD, no JVD CARDIO: Tachycardic rate, well-perfused, normal S1 and S2 PULM:  CTAB no wheezing or rhonchi GI/GU:  normal bowel sounds,  non-distended, non-tender MSK/SPINE:  No gross deformities, no edema SKIN:  no rash, atraumatic PSYCH:  Appropriate speech and behavior  Diagnostic and Interventional Summary    Labs Reviewed  CBC  COMPREHENSIVE METABOLIC PANEL    DG Chest Port 1 View  Final Result    DG Abd Portable 1 View  Final Result      Medications  sodium chloride 0.9 % bolus 1,000 mL (1,000 mLs Intravenous New Bag/Given 01/17/19 1308)  glucagon (human recombinant) (GLUCAGEN) injection 2 mg (2 mg Intravenous Given 01/17/19 1323)     Procedures Critical Care  ED Course and Medical Decision Making  I have reviewed the triage vital signs and the nursing notes.  Pertinent labs & imaging results that were available during my care of the patient were reviewed by me and considered in my medical decision making (see below for details).  Consistent with esophageal food bolus, likely incomplete given that she can tolerate saliva.  Will attempt glucagon, if unsuccessful will consult GI for scope.  Awaiting GI recommendations, anticipating need for EGD procedure.  Signed out to oncoming provider at shift change.  Would anticipate discharge after resolution of fluid bolus.  Barth Kirks. Sedonia Small, [place] [email or web address]  Final Clinical Impressions(s) / ED Diagnoses     ICD-10-CM   1. Food impaction of esophagus T18.128A DG Chest [facility]    DG Chest [facility]    ED Discharge Orders    None         Maudie Flakes, MD 01/17/19 [phone]

## 2019-01-17 NOTE — ED Triage Notes (Signed)
Pt states she has been able to drink water or eat in 3 days d/t food bolus. Pt states she has hx of esophogeal stretching and other procedures. Pt alert, skin warm and dry.

## 2019-01-17 NOTE — Anesthesia Preprocedure Evaluation (Signed)
Anesthesia Evaluation  Patient identified by MRN, date of birth, ID band Patient awake    Reviewed: Allergy & Precautions, NPO status , Patient's Chart, lab work & pertinent test results  Airway Mallampati: II  TM Distance: >3 FB Neck ROM: Full    Dental  (+) Teeth Intact, Dental Advisory Given   Pulmonary former smoker,    breath sounds clear to auscultation       Cardiovascular  Rhythm:Regular Rate:Normal     Neuro/Psych    GI/Hepatic   Endo/Other    Renal/GU      Musculoskeletal   Abdominal   Peds  Hematology   Anesthesia Other Findings   Reproductive/Obstetrics                             Anesthesia Physical Anesthesia Plan  ASA: II and emergent  Anesthesia Plan: General   Post-op Pain Management:    Induction: Intravenous, Rapid sequence and Cricoid pressure planned  PONV Risk Score and Plan: Ondansetron and Dexamethasone  Airway Management Planned: Oral ETT  Additional Equipment:   Intra-op Plan:   Post-operative Plan: Extubation in OR  Informed Consent: I have reviewed the patients History and Physical, chart, labs and discussed the procedure including the risks, benefits and alternatives for the proposed anesthesia with the patient or authorized representative who has indicated his/her understanding and acceptance.     Dental advisory given  Plan Discussed with: CRNA and Anesthesiologist  Anesthesia Plan Comments:         Anesthesia Quick Evaluation

## 2019-01-18 ENCOUNTER — Encounter (HOSPITAL_COMMUNITY): Payer: Self-pay | Admitting: Internal Medicine

## 2019-02-09 ENCOUNTER — Ambulatory Visit: Payer: Medicare HMO | Admitting: Gastroenterology

## 2019-02-12 ENCOUNTER — Encounter: Payer: Self-pay | Admitting: General Surgery

## 2019-02-13 ENCOUNTER — Ambulatory Visit (INDEPENDENT_AMBULATORY_CARE_PROVIDER_SITE_OTHER): Payer: Medicare HMO | Admitting: Gastroenterology

## 2019-02-13 ENCOUNTER — Other Ambulatory Visit: Payer: Self-pay

## 2019-02-13 ENCOUNTER — Encounter: Payer: Self-pay | Admitting: Gastroenterology

## 2019-02-13 VITALS — Ht 64.0 in | Wt 139.0 lb

## 2019-02-13 DIAGNOSIS — R1310 Dysphagia, unspecified: Secondary | ICD-10-CM

## 2019-02-13 DIAGNOSIS — R1319 Other dysphagia: Secondary | ICD-10-CM

## 2019-02-13 DIAGNOSIS — T18128D Food in esophagus causing other injury, subsequent encounter: Secondary | ICD-10-CM | POA: Diagnosis not present

## 2019-02-13 NOTE — H&P (View-Only) (Signed)
Mary Krueger    [id number]    May 29, 1946  Primary Care Physician:Sanders, Bailey Mech, MD  Referring Physician: Glendale Chard, [address] [place], [place] [address]  This service was provided via audio only telemedicine (telephone) due to [place] 19 pandemic.  Patient location: Home Provider location: Office Used 2 patient identifiers to confirm the correct person. Explained the limitations in evaluation and management via telemedicine. Patient is aware of potential medical charges for this visit.  Patient consented to this virtual visit.  The persons participating in this telemedicine service were myself and the patient   Chief complaint: Dysphagia HPI: 73 year old female history of hyperlipidemia and chronic dysphagia. She recently had an episode of food impaction last month, had emergent EGD by Dr. Hilarie Fredrickson.  Once a week she would have intermittent impaction, that would resolve spontaneously with sips of water or she regurgitates it back.  Her swallowing was much worse prior to surgery she had 35 years ago to cut open the sphincter, prior to that she had Botox injection and multiple dilation.  Surgery was performed at [facility]  No reports available to review during this visit.  Food impaction January 17, 2019 EGD by Dr. Hilarie Fredrickson large food bolus impacted in the lower esophagus, mild esophagitis no obvious stricture.  Colonoscopy December 13, 2017: Sigmoid diverticulosis and internal hemorrhoids otherwise normal exam   Since EGD denies any further food impactions.  She is avoiding meat.  No nausea or vomiting or abdominal pain.  Denies any change in bowel habits or rectal bleeding.  Outpatient Encounter Medications as of 02/13/2019  Medication Sig  . dorzolamide (TRUSOPT) 2 % ophthalmic solution   . meloxicam (MOBIC) 15 MG tablet Take 15 mg by mouth daily.  . metoprolol succinate (TOPROL-XL) 25 MG 24 hr tablet TAKE ONE TABLET BY MOUTH EVERY  EVENING  . PARoxetine (PAXIL) 40 MG tablet Take 1 tablet (40 mg total) by mouth daily.  . Pitavastatin Calcium (LIVALO) 2 MG TABS Take by mouth.  . tretinoin (RETIN-A) 0.05 % cream Apply topically at bedtime.   No facility-administered encounter medications on file as of 02/13/2019.     Allergies as of 02/13/2019 - Review Complete 02/12/2019  Allergen Reaction Noted  . Atorvastatin Other (See Comments) 10/19/2016  . Rosuvastatin Other (See Comments) 10/19/2016    Past Medical History:  Diagnosis Date  . Anxiety    Chronic  . Breast mass, left 2005   fatty on U/S  . Cataract   . Fibroid 1987   TAH/USO  . Glaucoma   . Hyperlipidemia   . Uses hearing aid    Bilateral   . Vitamin D deficiency     Past Surgical History:  Procedure Laterality Date  . CATARACT EXTRACTION W/ INTRAOCULAR LENS IMPLANT Bilateral 12/2015 01/2016  . COSMETIC SURGERY     Mini face lift  . ESOPHAGOGASTRODUODENOSCOPY  01/2019  . ESOPHAGOGASTRODUODENOSCOPY (EGD) WITH PROPOFOL N/A 01/17/2019   Procedure: ESOPHAGOGASTRODUODENOSCOPY (EGD) WITH PROPOFOL;  Surgeon: Jerene Bears, MD;  Location: [facility];  Service: Gastroenterology;  Laterality: N/A;  . EYE SURGERY  06/2012   eye lift  . FOREIGN BODY REMOVAL  01/17/2019   Procedure: FOREIGN BODY REMOVAL;  Surgeon: Jerene Bears, MD;  Location: [facility] ENDOSCOPY;  Service: Gastroenterology;;  . LIPOMA EXCISION Left 2009   left arm  . STOMACH SURGERY  1997   Sphincter  . TONSILLECTOMY AND ADENOIDECTOMY  1972  . TOTAL  ABDOMINAL HYSTERECTOMY W/ BILATERAL SALPINGOOPHORECTOMY    . TRIGGER FINGER RELEASE     right    Family History  Problem Relation Age of Onset  . Diabetes Mother   . Prostate cancer Father   . Colon cancer Father   . Diabetes Brother   . Prostate cancer Brother   . Colon polyps Neg Hx   . Esophageal cancer Neg Hx   . Stomach cancer Neg Hx   . Rectal cancer Neg Hx     Social History   Socioeconomic History  . Marital status: Divorced     Spouse name: Not on file  . Number of children: Not on file  . Years of education: Not on file  . Highest education level: Not on file  Occupational History  . Occupation: retired  Scientific laboratory technician  . Financial resource strain: Not hard at all  . Food insecurity:    Worry: Never true    Inability: Never true  . Transportation needs:    Medical: No    Non-medical: No  Tobacco Use  . Smoking status: Former Research scientist (life sciences)  . Smokeless tobacco: Never Used  . Tobacco comment: quit in 1981  Substance and Sexual Activity  . Alcohol use: No    Alcohol/week: 0.0 standard drinks  . Drug use: No  . Sexual activity: Yes    Partners: Male  Lifestyle  . Physical activity:    Days per week: 0 days    Minutes per session: 0 min  . Stress: Not at all  Relationships  . Social connections:    Talks on phone: Not on file    Gets together: Not on file    Attends religious service: Not on file    Active member of club or organization: Not on file    Attends meetings of clubs or organizations: Not on file    Relationship status: Not on file  . Intimate partner violence:    Fear of current or ex partner: Not on file    Emotionally abused: Not on file    Physically abused: Not on file    Forced sexual activity: Not on file  Other Topics Concern  . Not on file  Social History Narrative   Lives alone.        Review of systems: Review of Systems as per HPI All other systems reviewed and are negative.   Physical Exam: Vitals were not taken and physical exam was not performed during this virtual visit.  Data Reviewed:  Reviewed labs, radiology imaging, old records and pertinent past GI work up   Assessment and Plan/Recommendations:  73 year old female recent ER visit with food impaction status post EGD with removal of food bolus from lower esophagus  Gives history of EGD with dilation, Botox injection and also surgery to keep the lower sphincter open about 35 years ago at [facility].  She is not  able to tell me if she was ever diagnosed with achalasia but her history is concerning for it.  We will obtain barium esophagram to assess esophageal motility and lower sphincter.  Based on findings will consider follow-up EGD and esophageal manometry if needed  Advised patient to avoid tough meat and raw chunky vegetables, cut food to small bite size pieces and chew food really well  Follow-up telemedicine visit in 4 weeks.    Mary Krueger , MD    CC: Glendale Chard, MD

## 2019-02-13 NOTE — Progress Notes (Signed)
Mary Krueger    [id number]    Dec 02, 1945  Primary Care Physician:Sanders, Bailey Mech, MD  Referring Physician: Glendale Chard, [address] [place], [place] [address]  This service was provided via audio only telemedicine (telephone) due to [place] 19 pandemic.  Patient location: Home Provider location: Office Used 2 patient identifiers to confirm the correct person. Explained the limitations in evaluation and management via telemedicine. Patient is aware of potential medical charges for this visit.  Patient consented to this virtual visit.  The persons participating in this telemedicine service were myself and the patient   Chief complaint: Dysphagia HPI: 73 year old female history of hyperlipidemia and chronic dysphagia. She recently had an episode of food impaction last month, had emergent EGD by Dr. Hilarie Fredrickson.  Once a week she would have intermittent impaction, that would resolve spontaneously with sips of water or she regurgitates it back.  Her swallowing was much worse prior to surgery she had 35 years ago to cut open the sphincter, prior to that she had Botox injection and multiple dilation.  Surgery was performed at [facility].  No reports available to review during this visit.  Food impaction January 17, 2019 EGD by Dr. Hilarie Fredrickson large food bolus impacted in the lower esophagus, mild esophagitis no obvious stricture.  Colonoscopy December 13, 2017: Sigmoid diverticulosis and internal hemorrhoids otherwise normal exam   Since EGD denies any further food impactions.  She is avoiding meat.  No nausea or vomiting or abdominal pain.  Denies any change in bowel habits or rectal bleeding.  Outpatient Encounter Medications as of 02/13/2019  Medication Sig  . dorzolamide (TRUSOPT) 2 % ophthalmic solution   . meloxicam (MOBIC) 15 MG tablet Take 15 mg by mouth daily.  . metoprolol succinate (TOPROL-XL) 25 MG 24 hr tablet TAKE ONE TABLET BY MOUTH EVERY  EVENING  . PARoxetine (PAXIL) 40 MG tablet Take 1 tablet (40 mg total) by mouth daily.  . Pitavastatin Calcium (LIVALO) 2 MG TABS Take by mouth.  . tretinoin (RETIN-A) 0.05 % cream Apply topically at bedtime.   No facility-administered encounter medications on file as of 02/13/2019.     Allergies as of 02/13/2019 - Review Complete 02/12/2019  Allergen Reaction Noted  . Atorvastatin Other (See Comments) 10/19/2016  . Rosuvastatin Other (See Comments) 10/19/2016    Past Medical History:  Diagnosis Date  . Anxiety    Chronic  . Breast mass, left 2005   fatty on U/S  . Cataract   . Fibroid 1987   TAH/USO  . Glaucoma   . Hyperlipidemia   . Uses hearing aid    Bilateral   . Vitamin D deficiency     Past Surgical History:  Procedure Laterality Date  . CATARACT EXTRACTION W/ INTRAOCULAR LENS IMPLANT Bilateral 12/2015 01/2016  . COSMETIC SURGERY     Mini face lift  . ESOPHAGOGASTRODUODENOSCOPY  01/2019  . ESOPHAGOGASTRODUODENOSCOPY (EGD) WITH PROPOFOL N/A 01/17/2019   Procedure: ESOPHAGOGASTRODUODENOSCOPY (EGD) WITH PROPOFOL;  Surgeon: Jerene Bears, MD;  Location: [facility];  Service: Gastroenterology;  Laterality: N/A;  . EYE SURGERY  06/2012   eye lift  . FOREIGN BODY REMOVAL  01/17/2019   Procedure: FOREIGN BODY REMOVAL;  Surgeon: Jerene Bears, MD;  Location[facility] ENDOSCOPY;  Service: Gastroenterology;;  . LIPOMA EXCISION Left 2009   left arm  . STOMACH SURGERY  1997   Sphincter  . TONSILLECTOMY AND ADENOIDECTOMY  1972  . TOTAL  ABDOMINAL HYSTERECTOMY W/ BILATERAL SALPINGOOPHORECTOMY    . TRIGGER FINGER RELEASE     right    Family History  Problem Relation Age of Onset  . Diabetes Mother   . Prostate cancer Father   . Colon cancer Father   . Diabetes Brother   . Prostate cancer Brother   . Colon polyps Neg Hx   . Esophageal cancer Neg Hx   . Stomach cancer Neg Hx   . Rectal cancer Neg Hx     Social History   Socioeconomic History  . Marital status: Divorced     Spouse name: Not on file  . Number of children: Not on file  . Years of education: Not on file  . Highest education level: Not on file  Occupational History  . Occupation: retired  Scientific laboratory technician  . Financial resource strain: Not hard at all  . Food insecurity:    Worry: Never true    Inability: Never true  . Transportation needs:    Medical: No    Non-medical: No  Tobacco Use  . Smoking status: Former Research scientist (life sciences)  . Smokeless tobacco: Never Used  . Tobacco comment: quit in 1981  Substance and Sexual Activity  . Alcohol use: No    Alcohol/week: 0.0 standard drinks  . Drug use: No  . Sexual activity: Yes    Partners: Male  Lifestyle  . Physical activity:    Days per week: 0 days    Minutes per session: 0 min  . Stress: Not at all  Relationships  . Social connections:    Talks on phone: Not on file    Gets together: Not on file    Attends religious service: Not on file    Active member of club or organization: Not on file    Attends meetings of clubs or organizations: Not on file    Relationship status: Not on file  . Intimate partner violence:    Fear of current or ex partner: Not on file    Emotionally abused: Not on file    Physically abused: Not on file    Forced sexual activity: Not on file  Other Topics Concern  . Not on file  Social History Narrative   Lives alone.        Review of systems: Review of Systems as per HPI All other systems reviewed and are negative.   Physical Exam: Vitals were not taken and physical exam was not performed during this virtual visit.  Data Reviewed:  Reviewed labs, radiology imaging, old records and pertinent past GI work up   Assessment and Plan/Recommendations:  73 year old female recent ER visit with food impaction status post EGD with removal of food bolus from lower esophagus  Gives history of EGD with dilation, Botox injection and also surgery to keep the lower sphincter open about 35 years ago at [facility].  She is not  able to tell me if she was ever diagnosed with achalasia but her history is concerning for it.  We will obtain barium esophagram to assess esophageal motility and lower sphincter.  Based on findings will consider follow-up EGD and esophageal manometry if needed  Advised patient to avoid tough meat and raw chunky vegetables, cut food to small bite size pieces and chew food really well  Follow-up telemedicine visit in 4 weeks.    Damaris Hippo , MD    CC: Glendale Chard, MD

## 2019-02-13 NOTE — Patient Instructions (Addendum)
We will obtain barium esophagram to assess esophageal motility and lower sphincter.  Based on findings will consider follow-up EGD and esophageal manometry if needed  Advised patient to avoid tough meat and raw chunky vegetables, cut food to small bite size pieces and chew food really well  Follow-up telemedicine visit in 4 weeks.  ( 03/19/2019 at 8:30am)  You have been scheduled for a Barium Esophogram at [facility] (1st floor of the hospital) on 02/16/2019 at 10:15am . Please arrive 15 minutes prior to your appointment for registration. Make certain not to have anything to eat or drink 3 hours prior to your test. If you need to reschedule for any reason, please contact radiology at [phone] to do so. __________________________________________________________________ A barium swallow is an examination that concentrates on views of the esophagus. This tends to be a double contrast exam (barium and two liquids which, when combined, create a gas to distend the wall of the oesophagus) or single contrast (non-ionic iodine based). The study is usually tailored to your symptoms so a good history is essential. Attention is paid during the study to the form, structure and configuration of the esophagus, looking for functional disorders (such as aspiration, dysphagia, achalasia, motility and reflux) EXAMINATION You may be asked to change into a gown, depending on the type of swallow being performed. A radiologist and radiographer will perform the procedure. The radiologist will advise you of the type of contrast selected for your procedure and direct you during the exam. You will be asked to stand, sit or lie in several different positions and to hold a small amount of fluid in your mouth before being asked to swallow while the imaging is performed .In some instances you may be asked to swallow barium coated marshmallows to assess the motility of a solid food bolus. The exam can be recorded as a  digital or video fluoroscopy procedure. POST PROCEDURE It will take 1-2 days for the barium to pass through your system. To facilitate this, it is important, unless otherwise directed, to increase your fluids for the next 24-48hrs and to resume your normal diet.  This test typically takes about 30 minutes to perform. __________________________________________________________________________________  I appreciate the  opportunity to care for you  Thank You   Harl Bowie , MD

## 2019-02-16 ENCOUNTER — Ambulatory Visit (HOSPITAL_COMMUNITY)
Admission: RE | Admit: 2019-02-16 | Discharge: 2019-02-16 | Disposition: A | Discharge status: Home / Self Care | Payer: Medicare HMO | Source: Ambulatory Visit | Attending: Gastroenterology | Admitting: Gastroenterology

## 2019-02-16 ENCOUNTER — Encounter: Payer: Self-pay | Admitting: Gastroenterology

## 2019-02-16 ENCOUNTER — Other Ambulatory Visit: Payer: Self-pay

## 2019-02-16 DIAGNOSIS — R1310 Dysphagia, unspecified: Secondary | ICD-10-CM | POA: Insufficient documentation

## 2019-02-16 DIAGNOSIS — T18128D Food in esophagus causing other injury, subsequent encounter: Secondary | ICD-10-CM | POA: Insufficient documentation

## 2019-02-16 DIAGNOSIS — K224 Dyskinesia of esophagus: Secondary | ICD-10-CM | POA: Diagnosis not present

## 2019-02-16 DIAGNOSIS — R1319 Other dysphagia: Secondary | ICD-10-CM

## 2019-02-21 ENCOUNTER — Other Ambulatory Visit: Payer: Self-pay

## 2019-02-21 DIAGNOSIS — T18128D Food in esophagus causing other injury, subsequent encounter: Secondary | ICD-10-CM

## 2019-02-21 DIAGNOSIS — K220 Achalasia of cardia: Secondary | ICD-10-CM

## 2019-03-01 ENCOUNTER — Other Ambulatory Visit (HOSPITAL_COMMUNITY)
Admission: RE | Admit: 2019-03-01 | Discharge: 2019-03-01 | Disposition: A | Discharge status: Home / Self Care | Payer: Medicare HMO | Source: Ambulatory Visit | Attending: Gastroenterology | Admitting: Gastroenterology

## 2019-03-01 DIAGNOSIS — Z1159 Encounter for screening for other viral diseases: Secondary | ICD-10-CM | POA: Diagnosis not present

## 2019-03-02 ENCOUNTER — Encounter (HOSPITAL_COMMUNITY): Payer: Self-pay | Admitting: *Deleted

## 2019-03-02 ENCOUNTER — Other Ambulatory Visit: Payer: Self-pay

## 2019-03-02 LAB — NOVEL CORONAVIRUS, NAA (HOSP ORDER, SEND-OUT TO REF LAB; TAT 18-24 HRS): SARS-CoV-2, NAA: NOT DETECTED

## 2019-03-02 NOTE — Progress Notes (Signed)
Called patient to go over preop instructions. Pt agrees to remanined quarantined over the weekend.. Pt will let office know if anything changes. Pt denies fever, cold or flu symptoms .

## 2019-03-02 NOTE — Progress Notes (Signed)
SPOKE W/  _with patient via phone    SCREENING SYMPTOMS OF COVID 19:   COUGH--No  RUNNY NOSE--- No  SORE THROAT---No  NASAL CONGESTION----No  SNEEZING----No  SHORTNESS OF BREATH---No  DIFFICULTY BREATHING---No  TEMP >100.0 -----No  UNEXPLAINED BODY ACHES------No  CHILLS -------No-   HEADACHES ---------No  LOSS OF SMELL/ TASTE --------No    HAVE YOU OR ANY FAMILY MEMBER TRAVELLED PAST 14 DAYS OUT OF THE   COUNTY---No STATE----No COUNTRY----No  HAVE YOU OR ANY FAMILY MEMBER BEEN EXPOSED TO ANYONE WITH COVID 19? No

## 2019-03-05 ENCOUNTER — Encounter (HOSPITAL_COMMUNITY): Payer: Self-pay | Admitting: Gastroenterology

## 2019-03-05 ENCOUNTER — Ambulatory Visit (HOSPITAL_COMMUNITY): Payer: Medicare HMO | Admitting: Certified Registered Nurse Anesthetist

## 2019-03-05 ENCOUNTER — Encounter (HOSPITAL_COMMUNITY)
Admission: RE | Disposition: A | Discharge status: Home / Self Care | Payer: Self-pay | Source: Home / Self Care | Attending: Gastroenterology

## 2019-03-05 ENCOUNTER — Other Ambulatory Visit: Payer: Self-pay

## 2019-03-05 ENCOUNTER — Ambulatory Visit (HOSPITAL_COMMUNITY)
Admission: RE | Admit: 2019-03-05 | Discharge: 2019-03-05 | Disposition: A | Discharge status: Home / Self Care | Payer: Medicare HMO | Attending: Gastroenterology | Admitting: Gastroenterology

## 2019-03-05 DIAGNOSIS — E785 Hyperlipidemia, unspecified: Secondary | ICD-10-CM | POA: Insufficient documentation

## 2019-03-05 DIAGNOSIS — K228 Other specified diseases of esophagus: Secondary | ICD-10-CM | POA: Diagnosis not present

## 2019-03-05 DIAGNOSIS — F419 Anxiety disorder, unspecified: Secondary | ICD-10-CM | POA: Diagnosis not present

## 2019-03-05 DIAGNOSIS — H409 Unspecified glaucoma: Secondary | ICD-10-CM | POA: Diagnosis not present

## 2019-03-05 DIAGNOSIS — Z961 Presence of intraocular lens: Secondary | ICD-10-CM | POA: Insufficient documentation

## 2019-03-05 DIAGNOSIS — K222 Esophageal obstruction: Secondary | ICD-10-CM | POA: Diagnosis not present

## 2019-03-05 DIAGNOSIS — Z791 Long term (current) use of non-steroidal anti-inflammatories (NSAID): Secondary | ICD-10-CM | POA: Insufficient documentation

## 2019-03-05 DIAGNOSIS — Z79899 Other long term (current) drug therapy: Secondary | ICD-10-CM | POA: Insufficient documentation

## 2019-03-05 DIAGNOSIS — R1310 Dysphagia, unspecified: Secondary | ICD-10-CM | POA: Diagnosis present

## 2019-03-05 DIAGNOSIS — I10 Essential (primary) hypertension: Secondary | ICD-10-CM | POA: Diagnosis not present

## 2019-03-05 DIAGNOSIS — Z9842 Cataract extraction status, left eye: Secondary | ICD-10-CM | POA: Diagnosis not present

## 2019-03-05 DIAGNOSIS — Z9841 Cataract extraction status, right eye: Secondary | ICD-10-CM | POA: Diagnosis not present

## 2019-03-05 DIAGNOSIS — R69 Illness, unspecified: Secondary | ICD-10-CM | POA: Diagnosis not present

## 2019-03-05 DIAGNOSIS — Z87891 Personal history of nicotine dependence: Secondary | ICD-10-CM | POA: Insufficient documentation

## 2019-03-05 DIAGNOSIS — K220 Achalasia of cardia: Secondary | ICD-10-CM

## 2019-03-05 DIAGNOSIS — R1319 Other dysphagia: Secondary | ICD-10-CM

## 2019-03-05 DIAGNOSIS — T18128D Food in esophagus causing other injury, subsequent encounter: Secondary | ICD-10-CM

## 2019-03-05 HISTORY — PX: ESOPHAGEAL MANOMETRY: SHX5429

## 2019-03-05 HISTORY — PX: ESOPHAGOGASTRODUODENOSCOPY (EGD) WITH PROPOFOL: SHX5813

## 2019-03-05 HISTORY — PX: BALLOON DILATION: SHX5330

## 2019-03-05 SURGERY — MANOMETRY, ESOPHAGUS
Anesthesia: Topical

## 2019-03-05 SURGERY — ESOPHAGOGASTRODUODENOSCOPY (EGD) WITH PROPOFOL
Anesthesia: Monitor Anesthesia Care

## 2019-03-05 MED ORDER — PROPOFOL 10 MG/ML IV BOLUS
INTRAVENOUS | Status: AC
  Filled 2019-03-05: qty 40

## 2019-03-05 MED ORDER — LACTATED RINGERS IV SOLN
INTRAVENOUS | Status: DC | PRN
  Administered 2019-03-05: 08:00:00 via INTRAVENOUS

## 2019-03-05 MED ORDER — SODIUM CHLORIDE 0.9 % IV SOLN: INTRAVENOUS | Status: DC

## 2019-03-05 MED ORDER — PROPOFOL 10 MG/ML IV BOLUS
INTRAVENOUS | Status: DC | PRN
  Administered 2019-03-05 (×2): 30 mg via INTRAVENOUS

## 2019-03-05 MED ORDER — PROPOFOL 500 MG/50ML IV EMUL
INTRAVENOUS | Status: DC | PRN
  Administered 2019-03-05: 100 ug/kg/min via INTRAVENOUS

## 2019-03-05 SURGICAL SUPPLY — 15 items

## 2019-03-05 SURGICAL SUPPLY — 2 items
FACESHIELD LNG OPTICON STERILE (SAFETY) IMPLANT
GLOVE BIO SURGEON STRL SZ8 (GLOVE) ×4 IMPLANT

## 2019-03-05 NOTE — Progress Notes (Signed)
Esophageal Manometry done per protocol. Probe placed by Dr. Silverio Decamp during EGD just prior to test. Pt tolerated well without complication.

## 2019-03-05 NOTE — Discharge Instructions (Signed)
YOU HAD AN ENDOSCOPIC PROCEDURE TODAY: Refer to the procedure report and other information in the discharge instructions given to you for any specific questions about what was found during the examination. If this information does not answer your questions, please call [place] office at [phone] to clarify.   YOU SHOULD EXPECT: Some feelings of bloating in the abdomen. Passage of more gas than usual. Walking can help get rid of the air that was put into your GI tract during the procedure and reduce the bloating. If you had a lower endoscopy (such as a colonoscopy or flexible sigmoidoscopy) you may notice spotting of blood in your stool or on the toilet paper. Some abdominal soreness may be present for a day or two, also.  DIET: Your first meal following the procedure should be a light meal and then it is ok to progress to your normal diet. A half-sandwich or bowl of soup is an example of a good first meal. Heavy or fried foods are harder to digest and may make you feel nauseous or bloated. Drink plenty of fluids but you should avoid alcoholic beverages for 24 hours. If you had a esophageal dilation, please see attached instructions for diet.    ACTIVITY: Your care partner should take you home directly after the procedure. You should plan to take it easy, moving slowly for the rest of the day. You can resume normal activity the day after the procedure however YOU SHOULD NOT DRIVE, use power tools, machinery or perform tasks that involve climbing or major physical exertion for 24 hours (because of the sedation medicines used during the test).   SYMPTOMS TO REPORT IMMEDIATELY: A gastroenterologist can be reached at any hour. Please call [phone]  for any of the following symptoms:   Following upper endoscopy (EGD, EUS, ERCP, esophageal dilation) Vomiting of blood or coffee ground material  New, significant abdominal pain  New, significant chest pain or pain under the shoulder blades  Painful or  persistently difficult swallowing  New shortness of breath  Black, tarry-looking or red, bloody stools  FOLLOW UP:  If any biopsies were taken you will be contacted by phone or by letter within the next 1-3 weeks. Call [phone]  if you have not heard about the biopsies in 3 weeks.  Please also call with any specific questions about appointments or follow up tests.  

## 2019-03-05 NOTE — Anesthesia Postprocedure Evaluation (Signed)
Anesthesia Post Note  Patient: Rashena Dowling  Procedure(s) Performed: ESOPHAGOGASTRODUODENOSCOPY (EGD) WITH PROPOFOL (N/A ) BALLOON DILATION (N/A )     Patient location during evaluation: Endoscopy Anesthesia Type: MAC Level of consciousness: awake and alert Pain management: pain level controlled Vital Signs Assessment: post-procedure vital signs reviewed and stable Respiratory status: spontaneous breathing, nonlabored ventilation, respiratory function stable and patient connected to nasal cannula oxygen Cardiovascular status: stable and blood pressure returned to baseline Postop Assessment: no apparent nausea or vomiting Anesthetic complications: no    Last Vitals:  Vitals:   03/05/19 0911 03/05/19 0921  BP: (!) 131/97 (!) 131/54  Pulse: 70 67  Resp: 20 13  Temp:    SpO2: 95% 99%    Last Pain:  Vitals:   03/05/19 0902  TempSrc: Oral  PainSc: 0-No pain                 Catalina Gravel

## 2019-03-05 NOTE — Interval H&P Note (Signed)
History and Physical Interval Note:  03/05/2019 8:12 AM  Mary Krueger  has presented today for surgery, with the diagnosis of achalasia recent food impaction.  The various methods of treatment have been discussed with the patient and family. After consideration of risks, benefits and other options for treatment, the patient has consented to  Procedure(s) with comments: ESOPHAGOGASTRODUODENOSCOPY (EGD) WITH PROPOFOL (N/A) BALLOON DILATION (N/A) - TTS balloon dilation as a surgical intervention.  The patient's history has been reviewed, patient examined, no change in status, stable for surgery.  I have reviewed the patient's chart and labs.  Questions were answered to the patient's satisfaction.     [name]

## 2019-03-05 NOTE — Op Note (Signed)
[facility] Patient Name: Mary Krueger Procedure Date: 03/05/2019 MRN: [id number] Attending MD: Mauri Pole , MD Date of Birth: Jun 04, 1946 CSN: [id number] Age: 73 Admit Type: Outpatient Procedure:                Upper GI endoscopy Indications:              Dysphagia Providers:                Mauri Pole, MD, Cleda Daub, RN, Cherylynn Ridges, Technician Referring MD:              Medicines:                Monitored Anesthesia Care Complications:            No immediate complications. Estimated Blood Loss:     Estimated blood loss was minimal. Procedure:                Pre-Anesthesia Assessment:                           - Prior to the procedure, a History and Physical                            was performed, and patient medications and                            allergies were reviewed. The patient's tolerance of                            previous anesthesia was also reviewed. The risks                            and benefits of the procedure and the sedation                            options and risks were discussed with the patient.                            All questions were answered, and informed consent                            was obtained. Prior Anticoagulants: The patient has                            taken no previous anticoagulant or antiplatelet                            agents. ASA Grade Assessment: III - A patient with                            severe systemic disease. After reviewing the risks  and benefits, the patient was deemed in                            satisfactory condition to undergo the procedure.                           After obtaining informed consent, the endoscope was                            passed under direct vision. Throughout the                            procedure, the patient's blood pressure, pulse, and                            oxygen saturations  were monitored continuously. The                            [id number]-H190 ([id number] Olympus gastroscope was                            introduced through the mouth, and advanced to the                            second part of duodenum. The upper GI endoscopy was                            accomplished without difficulty. The patient                            tolerated the procedure well. Scope In: Scope Out: Findings:      The stomach was normal.      The examined duodenum was normal.      The lumen of the esophagus was moderately dilated.      One benign-appearing, intrinsic moderate stenosis was found 39 to 40 cm       from the incisors. This stenosis measured 1.5 cm (inner diameter) x 1 cm       (in length). The stenosis was traversed. A TTS dilator was passed       through the scope. Dilation with an 18-19-20 mm x 8 cm CRE balloon       dilator was performed to 20 mm. The dilation site was examined following       endoscope reinsertion and showed mild mucosal disruption. An esophageal       manometry impedence catheter was placed at 60 cm from the left naris. Impression:               - Normal stomach.                           - Normal examined duodenum.                           - Dilation in the entire esophagus.                           -  Benign-appearing esophageal stenosis. Dilated to                            79mm.                           - An esophageal manometry impedance catheter was                            placed.                           - No specimens collected. Moderate Sedation:      Not Applicable - Patient had care per Anesthesia. Recommendation:           - Patient has a contact number available for                            emergencies. The signs and symptoms of potential                            delayed complications were discussed with the                            patient. Return to normal activities tomorrow.                            Written  discharge instructions were provided to the                            patient.                           - Complete manometry study                           - Resume previous diet.                           - Continue present medications.                           - Return to my office in 2 weeks, telemedicine                            visit. Procedure Code(s):        --- Professional ---                           [phone], Esophagogastroduodenoscopy, flexible,                            transoral; with transendoscopic balloon dilation of                            esophagus (less than 30 mm diameter)  43241, 59, Esophagogastroduodenoscopy, flexible,                            transoral; with insertion of intraluminal tube or                            catheter Diagnosis Code(s):        --- Professional ---                           K22.8, Other specified diseases of esophagus                           K22.2, Esophageal obstruction                           R13.10, Dysphagia, unspecified CPT copyright 2019 [employer]. All rights reserved. The codes documented in this report are preliminary and upon coder review may  be revised to meet current compliance requirements. Mauri Pole, MD 03/05/2019 9:09:14 AM This report has been signed electronically. Number of Addenda: 0

## 2019-03-05 NOTE — Anesthesia Preprocedure Evaluation (Addendum)
Anesthesia Evaluation  Patient identified by MRN, date of birth, ID band Patient awake    Reviewed: Allergy & Precautions, NPO status , Patient's Chart, lab work & pertinent test results, reviewed documented beta blocker date and time   Airway Mallampati: II  TM Distance: >3 FB Neck ROM: Full    Dental  (+) Teeth Intact, Dental Advisory Given   Pulmonary former smoker,    Pulmonary exam normal breath sounds clear to auscultation       Cardiovascular hypertension, Pt. on home beta blockers Normal cardiovascular exam Rhythm:Regular Rate:Normal     Neuro/Psych PSYCHIATRIC DISORDERS Anxiety negative neurological ROS     GI/Hepatic Neg liver ROS, achalasia   Endo/Other  negative endocrine ROS  Renal/GU negative Renal ROS     Musculoskeletal negative musculoskeletal ROS (+)   Abdominal   Peds  Hematology negative hematology ROS (+)   Anesthesia Other Findings Day of surgery medications reviewed with the patient.  Reproductive/Obstetrics                             Anesthesia Physical Anesthesia Plan  ASA: II  Anesthesia Plan: MAC   Post-op Pain Management:    Induction: Intravenous  PONV Risk Score and Plan: 2 and Propofol infusion and Treatment may vary due to age or medical condition  Airway Management Planned: Nasal Cannula and Natural Airway  Additional Equipment:   Intra-op Plan:   Post-operative Plan:   Informed Consent: I have reviewed the patients History and Physical, chart, labs and discussed the procedure including the risks, benefits and alternatives for the proposed anesthesia with the patient or authorized representative who has indicated his/her understanding and acceptance.     Dental advisory given  Plan Discussed with: CRNA and Anesthesiologist  Anesthesia Plan Comments:         Anesthesia Quick Evaluation

## 2019-03-05 NOTE — Transfer of Care (Signed)
Immediate Anesthesia Transfer of Care Note  Patient: Mary Krueger  Procedure(s) Performed: ESOPHAGOGASTRODUODENOSCOPY (EGD) WITH PROPOFOL (N/A ) BALLOON DILATION (N/A )  Patient Location: PACU  Anesthesia Type:MAC  Level of Consciousness: awake, alert , oriented and patient cooperative  Airway & Oxygen Therapy: Patient Spontanous Breathing and Patient connected to nasal cannula oxygen  Post-op Assessment: Report given to RN and Post -op Vital signs reviewed and stable  Post vital signs: Reviewed and stable  Last Vitals:  Vitals Value Taken Time  BP    Temp    Pulse    Resp    SpO2      Last Pain:  Vitals:   03/05/19 0724  TempSrc: Oral  PainSc: 0-No pain         Complications: No apparent anesthesia complications

## 2019-03-07 ENCOUNTER — Encounter (HOSPITAL_COMMUNITY): Payer: Self-pay | Admitting: Gastroenterology

## 2019-03-16 ENCOUNTER — Encounter: Payer: Self-pay | Admitting: *Deleted

## 2019-03-19 ENCOUNTER — Other Ambulatory Visit: Payer: Self-pay

## 2019-03-19 ENCOUNTER — Encounter: Payer: Self-pay | Admitting: Gastroenterology

## 2019-03-19 ENCOUNTER — Ambulatory Visit (INDEPENDENT_AMBULATORY_CARE_PROVIDER_SITE_OTHER): Payer: Medicare HMO | Admitting: Gastroenterology

## 2019-03-19 VITALS — Ht 64.0 in | Wt 139.0 lb

## 2019-03-19 DIAGNOSIS — R1319 Other dysphagia: Secondary | ICD-10-CM

## 2019-03-19 DIAGNOSIS — R1310 Dysphagia, unspecified: Secondary | ICD-10-CM

## 2019-03-19 DIAGNOSIS — K220 Achalasia of cardia: Secondary | ICD-10-CM | POA: Diagnosis not present

## 2019-03-19 NOTE — Patient Instructions (Addendum)
We will contact you with your appointment for your hospital procedure  POEM procedure: please click or copy paste the link below [email or web address]   Balloon dilation (pneumatic dilatation) -- For balloon dilation, the patient swallows a collapsed balloon that is positioned in the LES. An x-ray machine is often used to guide placement of the balloon. When the balloon has been positioned at the LES, it is inflated abruptly to a large size in order to tear the muscle of the LES. This procedure is effective for relieving the swallowing difficulty in patients with achalasia in approximately two-thirds of people, although chest pain persists in some. Patients frequently require more than one balloon dilation treatment for adequate relief.  Procedure -- If you have balloon dilation, you will be asked to drink only liquids for 12 hours to two days in advance (a longer period is recommended if you have a great deal of food retention in the esophagus). Using endoscopy and fluoroscopy (x-ray), a physician advances a guide wire down the esophagus and positions it inside the LES. A deflated balloon is then advanced along this guide wire, positioned inside the LES, and inflated for a variable period ranging from seconds to minutes. The balloon is then deflated and withdrawn, and you are monitored in a recovery area for a number of hours to detect any complications. Another method of performing pneumatic dilatation uses a special balloon dilator (Esoflip) that provides real-time information about the position of the balloon and diameter of the esophagus without the need for fluoroscopy. The Esoflip procedure otherwise is quite similar to fluoroscopy-guided dilation. After balloon dilation, physicians often perform an x-ray test similar to the barium swallow described above to make sure that the balloon has not created a hole (perforation) in the esophagus. If  there are no complications, you can usually resume eating when you have recovered from the procedure. If your day-to-day symptoms do not improve, additional dilations can be performed.  Success rate -- A single balloon dilation session continues to relieve symptoms of achalasia in about 60 percent of people one year after the procedure and in about 25 percent of people five years after the procedure. Higher success rates have been reported in some studies. The success rate after longer periods has not been well studied, but some people have remained symptom-free for as long as 25 years.  Complications -- About 15 percent of people experience severe chest pain immediately after balloon dilation and some experience fever. The most serious complication of balloon dilation is creation of a hole (perforation) in the wall of the esophagus; this complication occurs in about 2 to 5 percent of people undergoing the procedure. Symptoms of persistent or worsening pain in the hours after the procedure may indicate a perforation. Perforations of the esophagus after balloon dilation are usually small. For the treatment of small perforations, your doctor will probably admit you to the hospital for intravenous feeding and antibiotic treatment. This usually results in healing of the perforation within one week without surgery. Large perforations may require emergency surgery for repair. In some cases, your doctor might recommend surgery even for a small perforation. Another option for treatment of perforations is the placement of an esophageal stent, which is a short plastic tube that is positioned in the esophagus to seal the perforation. The stent prevents swallowed material from entering the perforation, which enables the perforation to heal, but allows swallowed material to pass through the stent into the stomach. A key factor in the successful treatment of  perforation of the esophagus is rapid identification of the  perforation and rapid implementation of treatment. You should call your physician immediately if you experience increasing pain after balloon dilation, especially if you develop a fever or chills. Bleeding is another important, but infrequent complication of balloon dilation. This complication usually occurs immediately after the dilation. Symptoms of bleeding include dizziness or fainting, especially on standing up, vomiting of blood or material that looks like coffee grounds, and the passage of black or bloody stools. You should notify your doctor immediately if you experience these symptoms.  Patients also can develop gastroesophageal reflux disease (GERD) after balloon dilation. Because the LES is the principal barrier that prevents stomach contents from refluxing (backwashing) into the esophagus, LES disruption by balloon dilation can lead to acid reflux. GERD occurs in about 2 percent of people after balloon dilation, but is usually well controlled with acid-reducing medications. (See "Patient education: Gastroesophageal reflux disease in adults (Beyond the Basics)".)  Surgery (myotomy) -- Myotomy is an operation that is used to weaken the LES by cutting its muscle fibers. The most common surgical technique used to treat achalasia is called the Heller myotomy, in which the surgeon cuts the muscles at the end of the esophagus and at the top of the stomach. In the past, this surgery was performed through a large (open) incision in the chest or abdomen. Today, this surgery is usually performed laparoscopically, using instruments and a television camera that are passed into the abdomen through small abdominal incisions. People who undergo laparoscopic myotomy are given general anesthesia, and generally stay in the hospital for one to two days.   Success rate -- Surgery relieves symptoms in 70 to 90 percent of people. Symptom relief is sustained in about 85 percent of people 10 years after surgery and in  about 65 percent of people 20 years after the surgery. Thus, some consider surgery to be a more definitive treatment for achalasia than balloon dilation or botulinum toxin injection (see below).   Complications -- Postoperative pain is expected, and is treated with pain medications. Like balloon dilation, there is a risk of acid reflux following myotomy, which can cause damage to the esophagus over time. During the operation for myotomy, surgeons often perform an additional procedure called a fundoplication in which a portion of the stomach is wrapped around the esophagus to prevent the reflux of stomach contents (figure 3). However, the fundoplication does not always prevent reflux, and it can cause additional complications such as difficulty swallowing, bloating, flatulence, and diarrhea. (See "Patient education: Gastroesophageal reflux disease in adults (Beyond the Basics)".) Peroral endoscopy myotomy -- Peroral endoscopic myotomy (POEM) is an endoscopic technique for performing myotomy of the LES. In POEM, the endoscopist passes an electrical scalpel through the endoscope to make an incision in the lining of the esophagus and to create a tunnel within the wall of the esophagus (between the inner lining of the esophagus and the outer muscle layer of the esophagus). The endoscope is advanced into that tunnel, and the muscle of the esophagus can be cut using the electrical scalpel device that is passed through the endoscope. Early studies suggest that POEM can achieve results comparable to or even better than those of pneumatic dilation and surgical myotomy, and with similar safety. However, POEM is a newer procedure, and knowledge of its long-term outcome is limited. Since POEM is not routinely combined with a procedure to prevent the reflux of gastric contents, problematic GERD can occur after POEM. Presently, POEM  is being performed in a limited number of centers, and the procedure may not be widely  available. Differences in the experience and skill of the endoscopists who perform POEM also might have considerable influence on the outcome of the procedure.     Achalasia  Achalasia is a disorder that affects the part of the body that moves food from your mouth to your stomach (esophagus). This disorder makes it difficult for your esophagus to move liquids and food into your stomach. This makes it harder for you to swallow. Achalasia affects the muscle between your esophagus and your stomach (lower esophageal sphincter, LES). Normally, this muscle relaxes after you swallow. With achalasia, it does not relax, and there is increased pressure in this area. What are the causes? The cause of this condition is not known. What increases the risk? You may be more likely to develop this condition if:  You are 47-18 years old.  You have a family history of achalasia.  You have an autoimmune disease, such as: ? Diabetes. ? Hypothyroidism. ? Sjgren syndrome. ? Systemic lupus erythematosus. What are the signs or symptoms? Symptoms of this condition include:  Difficulty swallowing or pain with swallowing.  Food moving up from your stomach to your mouth (regurgitation).  Weight loss.  Coughing or wheezing.  Heartburn.  Difficulty burping.  Vomiting.  Bad breath (halitosis).  Chest pain. How is this diagnosed? This condition may be diagnosed based on:  Your symptoms, your medical history, and a physical exam.  Tests, such as: ? X-rays. ? Barium swallow study. In this test, X-rays are taken of your esophagus after you swallow a white liquid (barium) that shows up well on X-rays. ? Endoscopy. In this test, your health care provider looks at your esophagus using a small, flexible tube that has a video camera at the end (endoscope). ? Esophageal manometry. This test checks to see if increased pressure is affecting your LES. How is this treated? There is no cure for this  condition, but treatment can help to manage your symptoms. Treatment may include:   Stretching or widening (dilating) your LES. This is done with an endoscopy procedure that involves using a balloon to stretch out the lower esophagus. (Pneumatic dilation)  Surgery to reduce pressure in your LES. This may be done with an endoscopy procedure that involves cutting or removing thickened muscle in the area. (POEM)  Follow these instructions at home: Eating and drinking  Do not eat or drink while you are reclined or lying down.  Eat your food slowly.  Cut food into small pieces.  Try to eat soft food that is easier to swallow. General instructions  Take over-the-counter and prescription medicines only as told by your health care provider.  Do not use any products that contain nicotine or tobacco, such as cigarettes and e-cigarettes. If you need help quitting, ask your health care provider.  Keep all follow-up visits as told by your health care provider. This is important. Contact a health care provider if:  Your symptoms do not go away after treatment.  You have a fever.  You have any new symptoms.  Your pain is worse. Get help right away if:  You vomit blood.  You have chest pain.  You have difficulty breathing.  You develop a swelled (distended) abdomen. Summary  Achalasia is a disorder that affects the part of the body that moves food from your mouth to your stomach (esophagus). It also affects the muscle between your esophagus and your  stomach (lower esophageal sphincter, LES).  Achalasia may cause difficulty swallowing or pain with swallowing.  The cause of this condition is not known.  There is no cure for achalasia, but treatment can help to manage your symptoms. This information is not intended to replace advice given to you by your health care provider. Make sure you discuss any questions you have with your health care provider. Document Released: 06/30/2005  Document Revised: 07/15/2017 Document Reviewed: 07/15/2017 Elsevier Interactive Patient Education  2019 [employer].

## 2019-03-19 NOTE — Progress Notes (Addendum)
Mary Krueger    [id number]    [birth year]  Primary Care Physician:Sanders, Bailey Mech, MD  Referring Physician: Glendale Chard, [address] [place],  [place] [address]  This service was provided via audio only telemedicine (telephone) due to [place] 19 pandemic.  Patient location: Home Provider location: Office Used 2 patient identifiers to confirm the correct person. Explained the limitations in evaluation and management via telemedicine. Patient is aware of potential medical charges for this visit.  Patient consented to this virtual visit.  The persons participating in this telemedicine service were myself and the patient    Chief complaint: Dysphagia HPI: She is feeling much better after EGD with 20 mm balloon dilation, denies any dysphagia or regurgitation.  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  She is being careful with chewing and swallowing small bites of food. Denies any nausea, vomiting, abdominal pain, melena or bright red blood per rectum   Previous HPI May 74, 5930  73 year old female history of hyperlipidemia and chronic dysphagia. She recently had an episode of food impaction last month, had emergent EGD by Dr. Hilarie Fredrickson.  Once a week she would have intermittent impaction, that would resolve spontaneously with sips of water or she regurgitates it back.  Her swallowing was much worse prior to surgery she had 35 years ago to cut open the sphincter, prior to that she had Botox injection and multiple dilation.  Surgery was performed at [facility].  No reports available to review during this visit.  Food impaction January 17, 2019 EGD by Dr. Hilarie Fredrickson large food bolus impacted in the lower esophagus, mild esophagitis no obvious stricture.  Colonoscopy December 13, 2017: Sigmoid diverticulosis and internal hemorrhoids otherwise normal exam   Since EGD denies any further food impactions.  She is avoiding meat.  No nausea  or vomiting or abdominal pain.  Denies any change in bowel habits or rectal bleeding.   Outpatient Encounter Medications as of 03/19/2019  Medication Sig  . cholecalciferol (VITAMIN D3) 25 MCG (1000 UT) tablet Take 1,000 Units by mouth daily.  . Coenzyme Q10 (COQ10) 100 MG CAPS Take 100 mg by mouth 2 (two) times a day.  . meloxicam (MOBIC) 15 MG tablet Take 15 mg by mouth daily.  . metoprolol succinate (TOPROL-XL) 25 MG 24 hr tablet TAKE ONE TABLET BY MOUTH EVERY EVENING (Patient taking differently: Take 25 mg by mouth daily. )  . PARoxetine (PAXIL) 40 MG tablet Take 1 tablet (40 mg total) by mouth daily.  . Pitavastatin Calcium (LIVALO) 2 MG TABS Take by mouth.  . tretinoin (RETIN-A) 0.05 % cream Apply topically at bedtime. (Patient taking differently: Apply 1 application topically at bedtime as needed (SKIN). )   No facility-administered encounter medications on file as of 03/19/2019.     Allergies as of 03/19/2019 - Review Complete 03/19/2019  Allergen Reaction Noted  . Atorvastatin Other (See Comments) 10/19/2016  . Rosuvastatin Other (See Comments) 10/19/2016    Past Medical History:  Diagnosis Date  . Anxiety    Chronic  . Breast mass, left 2005   fatty on U/S  . Cataract   . Fibroid 1987   TAH/USO  . Glaucoma   . Hyperlipidemia   . Uses hearing aid    Bilateral   . Vitamin D deficiency     Past Surgical History:  Procedure Laterality Date  . BALLOON DILATION N/A 03/05/2019   Procedure: BALLOON DILATION;  Surgeon:  Mauri Pole, MD;  Location: [facility];  Service: Endoscopy;  Laterality: N/A;  TTS balloon dilation  . CATARACT EXTRACTION W/ INTRAOCULAR LENS IMPLANT Bilateral 12/2015 01/2016  . COSMETIC SURGERY     Mini face lift  . ESOPHAGEAL MANOMETRY N/A 03/05/2019   Procedure: ESOPHAGEAL MANOMETRY (EM);  Surgeon: Mauri Pole, MD;  Location: [facility];  Service: Endoscopy;  Laterality: N/A;  . ESOPHAGOGASTRODUODENOSCOPY  01/2019  .  ESOPHAGOGASTRODUODENOSCOPY (EGD) WITH PROPOFOL N/A 01/17/2019   Procedure: ESOPHAGOGASTRODUODENOSCOPY (EGD) WITH PROPOFOL;  Surgeon: Jerene Bears, MD;  Location: [facility];  Service: Gastroenterology;  Laterality: N/A;  . ESOPHAGOGASTRODUODENOSCOPY (EGD) WITH PROPOFOL N/A 03/05/2019   Procedure: ESOPHAGOGASTRODUODENOSCOPY (EGD) WITH PROPOFOL;  Surgeon: Mauri Pole, MD;  Location: [facility];  Service: Endoscopy;  Laterality: N/A;  . EYE SURGERY  06/2012   eye lift  . FOREIGN BODY REMOVAL  01/17/2019   Procedure: FOREIGN BODY REMOVAL;  Surgeon: Jerene Bears, MD;  Location: [facility] ENDOSCOPY;  Service: Gastroenterology;;  . LIPOMA EXCISION Left 2009   left arm  . STOMACH SURGERY  1997   Sphincter  . TONSILLECTOMY AND ADENOIDECTOMY  1972  . TOTAL ABDOMINAL HYSTERECTOMY W/ BILATERAL SALPINGOOPHORECTOMY    . TRIGGER FINGER RELEASE     right    Family History  Problem Relation Age of Onset  . Diabetes Mother   . Prostate cancer Father   . Colon cancer Father   . Diabetes Brother   . Prostate cancer Brother   . Colon polyps Neg Hx   . Esophageal cancer Neg Hx   . Stomach cancer Neg Hx   . Rectal cancer Neg Hx     Social History   Socioeconomic History  . Marital status: Divorced    Spouse name: Not on file  . Number of children: Not on file  . Years of education: Not on file  . Highest education level: Not on file  Occupational History  . Occupation: retired  Scientific laboratory technician  . Financial resource strain: Not hard at all  . Food insecurity    Worry: Never true    Inability: Never true  . Transportation needs    Medical: No    Non-medical: No  Tobacco Use  . Smoking status: Former Research scientist (life sciences)  . Smokeless tobacco: Never Used  . Tobacco comment: quit in 1981  Substance and Sexual Activity  . Alcohol use: No    Alcohol/week: 0.0 standard drinks  . Drug use: No  . Sexual activity: Yes    Partners: Male  Lifestyle  . Physical activity    Days per week: 0 days    Minutes  per session: 0 min  . Stress: Not at all  Relationships  . Social Herbalist on phone: Not on file    Gets together: Not on file    Attends religious service: Not on file    Active member of club or organization: Not on file    Attends meetings of clubs or organizations: Not on file    Relationship status: Not on file  . Intimate partner violence    Fear of current or ex partner: Not on file    Emotionally abused: Not on file    Physically abused: Not on file    Forced sexual activity: Not on file  Other Topics Concern  . Not on file  Social History Narrative   Lives alone.        Review of systems: Review of Systems as  per HPI All other systems reviewed and are negative.   Physical Exam: Vitals were not taken and physical exam was not performed during this virtual visit.  Data Reviewed:  Reviewed labs, radiology imaging, old records and pertinent past GI work up   Assessment and Plan/Recommendations:  73 year old female with history of hyperlipidemia, chronic dysphagia, achalasia status post Heller myotomy 35 years ago with recurrent dysphagia, recent ER visit with food impaction. 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 Denzil Magnuson , MD   CC: Glendale Chard, MD

## 2019-04-02 ENCOUNTER — Other Ambulatory Visit: Payer: Self-pay | Admitting: Internal Medicine

## 2019-05-24 ENCOUNTER — Other Ambulatory Visit: Payer: Self-pay

## 2019-05-24 MED ORDER — PAROXETINE HCL 40 MG PO TABS: 40.0000 mg | ORAL_TABLET | Freq: Every day | ORAL | 0 refills | Status: DC

## 2019-05-25 ENCOUNTER — Other Ambulatory Visit: Payer: Self-pay

## 2019-05-25 MED ORDER — PAROXETINE HCL 40 MG PO TABS: 40.0000 mg | ORAL_TABLET | Freq: Every day | ORAL | 0 refills | Status: DC

## 2019-06-06 DIAGNOSIS — H52223 Regular astigmatism, bilateral: Secondary | ICD-10-CM | POA: Diagnosis not present

## 2019-06-06 DIAGNOSIS — H524 Presbyopia: Secondary | ICD-10-CM | POA: Diagnosis not present

## 2019-06-06 DIAGNOSIS — H5202 Hypermetropia, left eye: Secondary | ICD-10-CM | POA: Diagnosis not present

## 2019-06-14 ENCOUNTER — Ambulatory Visit: Payer: Medicare HMO | Admitting: Internal Medicine

## 2019-06-20 ENCOUNTER — Other Ambulatory Visit: Payer: Self-pay

## 2019-06-21 ENCOUNTER — Ambulatory Visit (INDEPENDENT_AMBULATORY_CARE_PROVIDER_SITE_OTHER): Payer: Medicare HMO | Admitting: Internal Medicine

## 2019-06-21 ENCOUNTER — Encounter: Payer: Medicare HMO | Admitting: Internal Medicine

## 2019-06-21 ENCOUNTER — Encounter: Payer: Self-pay | Admitting: Internal Medicine

## 2019-06-21 VITALS — BP 124/78 | HR 74 | Temp 97.4°F | Ht 64.4 in | Wt 139.8 lb

## 2019-06-21 DIAGNOSIS — Z23 Encounter for immunization: Secondary | ICD-10-CM

## 2019-06-21 DIAGNOSIS — E782 Mixed hyperlipidemia: Secondary | ICD-10-CM | POA: Diagnosis not present

## 2019-06-21 DIAGNOSIS — Z0000 Encounter for general adult medical examination without abnormal findings: Secondary | ICD-10-CM

## 2019-06-21 DIAGNOSIS — I10 Essential (primary) hypertension: Secondary | ICD-10-CM

## 2019-06-21 LAB — POCT URINALYSIS DIPSTICK
Bilirubin, UA: NEGATIVE
Glucose, UA: NEGATIVE
Ketones, UA: NEGATIVE
Leukocytes, UA: NEGATIVE
Nitrite, UA: NEGATIVE
Protein, UA: NEGATIVE
Spec Grav, UA: 1.015 (ref 1.010–1.025)
Urobilinogen, UA: 0.2 E.U./dL
pH, UA: 6 (ref 5.0–8.0)

## 2019-06-21 NOTE — Patient Instructions (Signed)
CALL us WITH THE DATE OF YOUR SECOND PNEUMONIA SHOT    Preventive Care 75 Years and Older, Female Preventive care refers to lifestyle choices and visits with your health care provider that can promote health and wellness. This includes:  A yearly physical exam. This is also called an annual well check.  Regular dental and eye exams.  Immunizations.  Screening for certain conditions.  Healthy lifestyle choices, such as diet and exercise. What can I expect for my preventive care visit? Physical exam Your health care provider will check:  Height and weight. These may be used to calculate body mass index (BMI), which is a measurement that tells if you are at a healthy weight.  Heart rate and blood pressure.  Your skin for abnormal spots. Counseling Your health care provider may ask you questions about:  Alcohol, tobacco, and drug use.  Emotional well-being.  Home and relationship well-being.  Sexual activity.  Eating habits.  History of falls.  Memory and ability to understand (cognition).  Work and work Statistician.  Pregnancy and menstrual history. What immunizations do I need?  Influenza (flu) vaccine  This is recommended every year. Tetanus, diphtheria, and pertussis (Tdap) vaccine  You may need a Td booster every 10 years. Varicella (chickenpox) vaccine  You may need this vaccine if you have not already been vaccinated. Zoster (shingles) vaccine  You may need this after age 53. Pneumococcal conjugate (PCV13) vaccine  One dose is recommended after age 54. Pneumococcal polysaccharide (PPSV23) vaccine  One dose is recommended after age 9. Measles, mumps, and rubella (MMR) vaccine  You may need at least one dose of MMR if you were born in 1957 or later. You may also need a second dose. Meningococcal conjugate (MenACWY) vaccine  You may need this if you have certain conditions. Hepatitis A vaccine  You may need this if you have certain conditions  or if you travel or work in places where you may be exposed to hepatitis A. Hepatitis B vaccine  You may need this if you have certain conditions or if you travel or work in places where you may be exposed to hepatitis B. Haemophilus influenzae type b (Hib) vaccine  You may need this if you have certain conditions. You may receive vaccines as individual doses or as more than one vaccine together in one shot (combination vaccines). Talk with your health care provider about the risks and benefits of combination vaccines. What tests do I need? Blood tests  Lipid and cholesterol levels. These may be checked every 5 years, or more frequently depending on your overall health.  Hepatitis C test.  Hepatitis B test. Screening  Lung cancer screening. You may have this screening every year starting at age 65 if you have a 30-pack-year history of smoking and currently smoke or have quit within the past 15 years.  Colorectal cancer screening. All adults should have this screening starting at age 67 and continuing until age 87. Your health care provider may recommend screening at age 42 if you are at increased risk. You will have tests every 1-10 years, depending on your results and the type of screening test.  Diabetes screening. This is done by checking your blood sugar (glucose) after you have not eaten for a while (fasting). You may have this done every 1-3 years.  Mammogram. This may be done every 1-2 years. Talk with your health care provider about how often you should have regular mammograms.  BRCA-related cancer screening. This may be done  if you have a family history of breast, ovarian, tubal, or peritoneal cancers. Other tests  Sexually transmitted disease (STD) testing.  Bone density scan. This is done to screen for osteoporosis. You may have this done starting at age 79. Follow these instructions at home: Eating and drinking  Eat a diet that includes fresh fruits and vegetables,  whole grains, lean protein, and low-fat dairy products. Limit your intake of foods with high amounts of sugar, saturated fats, and salt.  Take vitamin and mineral supplements as recommended by your health care provider.  Do not drink alcohol if your health care provider tells you not to drink.  If you drink alcohol: ? Limit how much you have to 0-1 drink a day. ? Be aware of how much alcohol is in your drink. In the [place] one drink equals one 12 oz bottle of beer (355 mL), one 5 oz glass of wine (148 mL), or one 1 oz glass of hard liquor (44 mL). Lifestyle  Take daily care of your teeth and gums.  Stay active. Exercise for at least 30 minutes on 5 or more days each week.  Do not use any products that contain nicotine or tobacco, such as cigarettes, e-cigarettes, and chewing tobacco. If you need help quitting, ask your health care provider.  If you are sexually active, practice safe sex. Use a condom or other form of protection in order to prevent STIs (sexually transmitted infections).  Talk with your health care provider about taking a low-dose aspirin or statin. What's next?  Go to your health care provider once a year for a well check visit.  Ask your health care provider how often you should have your eyes and teeth checked.  Stay up to date on all vaccines. This information is not intended to replace advice given to you by your health care provider. Make sure you discuss any questions you have with your health care provider. Document Released: 10/17/2015 Document Revised: 09/14/2018 Document Reviewed: 09/14/2018 Elsevier Patient Education  2020 [employer].

## 2019-06-21 NOTE — Progress Notes (Signed)
Subjective:     Patient ID: Mary Krueger , female    DOB: 1946/09/26 , 73 y.o.   MRN: [id number]   Chief Complaint  Patient presents with  . Annual Exam    HPI Pt is here for yearly physical. Denies having any problems. Is up date on all her pneumonia shots and shingles. Eye xm and dental exam is up to date. Walks 2 miles twice a week. She will call us with update of 2nd pneumonia vaccine date.   Past Medical History:  Diagnosis Date  . Anxiety    Chronic  . Breast mass, left 2005   fatty on U/S  . Cataract   . Fibroid 1987   TAH/USO  . Glaucoma   . Hyperlipidemia   . Uses hearing aid    Bilateral   . Vitamin D deficiency      Family History  Problem Relation Age of Onset  . Diabetes Mother   . Prostate cancer Father   . Colon cancer Father   . Diabetes Brother   . Prostate cancer Brother   . Colon polyps Neg Hx   . Esophageal cancer Neg Hx   . Stomach cancer Neg Hx   . Rectal cancer Neg Hx      Current Outpatient Medications:  .  meloxicam (MOBIC) 15 MG tablet, Take 15 mg by mouth daily., Disp: , Rfl:  .  metoprolol succinate (TOPROL-XL) 25 MG 24 hr tablet, TAKE ONE TABLET BY MOUTH EVERY EVENING, Disp: 90 tablet, Rfl: 1 .  PARoxetine (PAXIL) 40 MG tablet, Take 1 tablet (40 mg total) by mouth daily., Disp: 90 tablet, Rfl: 0 .  cholecalciferol (VITAMIN D3) 25 MCG (1000 UT) tablet, Take 1,000 Units by mouth daily., Disp: , Rfl:  .  Coenzyme Q10 (COQ10) 100 MG CAPS, Take 100 mg by mouth 2 (two) times a day., Disp: , Rfl:  .  Pitavastatin Calcium (LIVALO) 2 MG TABS, Take by mouth., Disp: , Rfl:  .  tretinoin (RETIN-A) 0.05 % cream, Apply topically at bedtime. (Patient not taking: Reported on 06/21/2019), Disp: 45 g, Rfl: 0   Allergies  Allergen Reactions  . Atorvastatin Other (See Comments)    myalgias  . Rosuvastatin Other (See Comments)    myalgias     Review of Systems  Back is hunched but denies pain, the rest is neg.  Today's Vitals   06/21/19 1115   BP: 124/78  Pulse: 74  Temp: (!) 97.4 F (36.3 C)  TempSrc: Oral  Weight: 139 lb 12.8 oz (63.4 kg)  Height: 5' 4.4" (1.636 m)   Body mass index is 23.7 kg/m.   Objective:  Physical Exam  BP 124/78 (BP Location: Left Arm, Patient Position: Sitting, Cuff Size: Normal)   Pulse 74   Temp (!) 97.4 F (36.3 C) (Oral)   Ht 5' 4.4" (1.636 m)   Wt 139 lb 12.8 oz (63.4 kg)   LMP 10/04/1984   BMI 23.70 kg/m   General Appearance:    Alert, cooperative, no distress, appears stated age  Head:    Normocephalic, without obvious abnormality, atraumatic  Eyes:    PERRL, conjunctiva/corneas clear, EOM's intact  Ears:    Normal TM's and external ear canals, both ears  Nose:   Nares normal, septum midline, mucosa normal, no drainage    or sinus tenderness  Throat:   Lips, mucosa, and tongue normal; teeth and gums normal  Neck:   Supple, symmetrical, trachea midline, no adenopathy;  thyroid:  no enlargement/tenderness/nodules; no carotid   Bruit.  Back:     Symmetric, no curvature, ROM normal, no CVA tenderness  Lungs:     Clear to auscultation bilaterally, respirations unlabored  Chest Wall:    No tenderness or deformity   Heart:    Regular rate and rhythm, S1 and S2 normal, no murmur, rub   or gallop  Breast Exam:    No tenderness, masses, or nipple abnormality  Abdomen:     Soft, non-tender, bowel sounds active all four quadrants,    no masses, no organomegaly BACK- mild upper kyphosis noted. Normal ROM.         Extremities:   Extremities normal, atraumatic, no cyanosis or edema  Pulses:   2+ and symmetric all extremities  Skin:   Skin color, texture, turgor normal, no rashes or lesions  Lymph nodes:   Cervical, supraclavicular, and axillary nodes normal  Neurologic:   CNII-XII intact, normal strength, sensation and reflexes    Throughout. Normal Romberg, tandem gait, heel and tip toe gait.      EKG NSR UA- negative  Assessment And Plan:    1. Need for influenza vaccination-  ROUTINE.  - Flu vaccine HIGH DOSE PF (Fluzone High dose)  2. Essential hypertension- STABLE - EKG 12-Lead - POCT Urinalysis Dipstick (81002) - CMP14 + Anion Gap - CBC no Diff - Lipid Profile  3. Routine medical exam- ROUTINE. FU 1Y - CMP14 + Anion Gap - CBC no Diff - Lipid Profile - TSH - T4, Free - T3, free  4. Mixed hyperlipidemia-CHRONIC    LIPID PANEL ordered. May continue all her current meds.     Juliocesar Blasius RODRIGUEZ-SOUTHWORTH, PA-C    THE PATIENT IS ENCOURAGED TO PRACTICE SOCIAL DISTANCING DUE TO THE COVID-19 PANDEMIC.

## 2019-06-22 LAB — T3, FREE: T3, Free: 2.8 pg/mL (ref 2.0–4.4)

## 2019-06-22 LAB — LIPID PANEL
Chol/HDL Ratio: 4.5 ratio — ABNORMAL HIGH (ref 0.0–4.4)
Cholesterol, Total: 301 mg/dL — ABNORMAL HIGH (ref 100–199)
HDL: 67 mg/dL (ref >39)
LDL Chol Calc (NIH): 214 mg/dL — ABNORMAL HIGH (ref 0–99)
Triglycerides: 113 mg/dL (ref 0–149)
VLDL Cholesterol Cal: 20 mg/dL (ref 5–40)

## 2019-06-22 LAB — CBC
Hematocrit: 42.7 % (ref 34.0–46.6)
Hemoglobin: 14.6 g/dL (ref 11.1–15.9)
MCH: 31.6 pg (ref 26.6–33.0)
MCHC: 34.2 g/dL (ref 31.5–35.7)
MCV: 92 fL (ref 79–97)
Platelets: 353 10*3/uL (ref 150–450)
RBC: 4.62 x10E6/uL (ref 3.77–5.28)
RDW: 12.2 % (ref 11.7–15.4)
WBC: 6.1 10*3/uL (ref 3.4–10.8)

## 2019-06-22 LAB — CMP14 + ANION GAP
ALT: 12 IU/L (ref 0–32)
AST: 19 IU/L (ref 0–40)
Albumin/Globulin Ratio: 1.8 (ref 1.2–2.2)
Albumin: 4.4 g/dL (ref 3.7–4.7)
Alkaline Phosphatase: 76 IU/L (ref 39–117)
Anion Gap: 19 mmol/L — ABNORMAL HIGH (ref 10.0–18.0)
BUN/Creatinine Ratio: 19 (ref 12–28)
BUN: 17 mg/dL (ref 8–27)
Bilirubin Total: 0.3 mg/dL (ref 0.0–1.2)
CO2: 25 mmol/L (ref 20–29)
Calcium: 9.7 mg/dL (ref 8.7–10.3)
Chloride: 98 mmol/L (ref 96–106)
Creatinine, Ser: 0.88 mg/dL (ref 0.57–1.00)
GFR calc Af Amer: 74 mL/min/{1.73_m2} (ref >59)
GFR calc non Af Amer: 64 mL/min/{1.73_m2} (ref >59)
Globulin, Total: 2.5 g/dL (ref 1.5–4.5)
Glucose: 90 mg/dL (ref 65–99)
Potassium: 4.4 mmol/L (ref 3.5–5.2)
Sodium: 142 mmol/L (ref 134–144)
Total Protein: 6.9 g/dL (ref 6.0–8.5)

## 2019-06-22 LAB — T4, FREE: Free T4: 0.99 ng/dL (ref 0.82–1.77)

## 2019-06-22 LAB — TSH: TSH: 3.31 u[IU]/mL (ref 0.450–4.500)

## 2019-06-27 DIAGNOSIS — H52223 Regular astigmatism, bilateral: Secondary | ICD-10-CM | POA: Diagnosis not present

## 2019-06-27 DIAGNOSIS — H5202 Hypermetropia, left eye: Secondary | ICD-10-CM | POA: Diagnosis not present

## 2019-06-27 DIAGNOSIS — H401131 Primary open-angle glaucoma, bilateral, mild stage: Secondary | ICD-10-CM | POA: Diagnosis not present

## 2019-06-27 DIAGNOSIS — H524 Presbyopia: Secondary | ICD-10-CM | POA: Diagnosis not present

## 2019-06-28 ENCOUNTER — Telehealth: Payer: Self-pay

## 2019-06-28 NOTE — Telephone Encounter (Signed)
Called pt to give her Sunday [employer] message no answer, no vm   Rodriguez-[place]worth, Mary Krueger  [place], [place]        Please inform pt that her cholesterol panel is worse than last time. Total cholesterol was 284, now is 301, and the bad cholesterol called LDL which causes blockage in arteries is 214 and needs to be 99 or less. I know she cant tolerate prescription cholesterol medications, but has she tried a natural cholesterol reducing medication called Red Yeast Rice? I would recommend it if not as well as trying to follow a vegetarian diet which I've seen my patients who could not tolerate cholesterol medications, being able to bring their cholesterol down. Needs repeated cholesterol panel in 6 month. Please make sure she has apt with Dr Baird Cancer for FU.  The rest of her labs are normal

## 2019-08-15 DIAGNOSIS — H5202 Hypermetropia, left eye: Secondary | ICD-10-CM | POA: Diagnosis not present

## 2019-08-15 DIAGNOSIS — H524 Presbyopia: Secondary | ICD-10-CM | POA: Diagnosis not present

## 2019-08-15 DIAGNOSIS — H52223 Regular astigmatism, bilateral: Secondary | ICD-10-CM | POA: Diagnosis not present

## 2019-08-15 DIAGNOSIS — H401131 Primary open-angle glaucoma, bilateral, mild stage: Secondary | ICD-10-CM | POA: Diagnosis not present

## 2019-08-19 ENCOUNTER — Other Ambulatory Visit: Payer: Self-pay | Admitting: Internal Medicine

## 2019-10-07 ENCOUNTER — Other Ambulatory Visit: Payer: Self-pay | Admitting: Internal Medicine

## 2019-10-10 ENCOUNTER — Other Ambulatory Visit: Payer: Self-pay

## 2019-10-11 ENCOUNTER — Ambulatory Visit (INDEPENDENT_AMBULATORY_CARE_PROVIDER_SITE_OTHER): Payer: Medicare HMO | Admitting: Obstetrics & Gynecology

## 2019-10-11 ENCOUNTER — Encounter: Payer: Self-pay | Admitting: Obstetrics & Gynecology

## 2019-10-11 VITALS — BP 118/70 | HR 84 | Temp 96.1°F | Resp 12 | Ht 65.0 in | Wt 144.0 lb

## 2019-10-11 DIAGNOSIS — E785 Hyperlipidemia, unspecified: Secondary | ICD-10-CM

## 2019-10-11 DIAGNOSIS — Z789 Other specified health status: Secondary | ICD-10-CM | POA: Diagnosis not present

## 2019-10-11 DIAGNOSIS — Z01419 Encounter for gynecological examination (general) (routine) without abnormal findings: Secondary | ICD-10-CM | POA: Diagnosis not present

## 2019-10-11 NOTE — Patient Instructions (Signed)
[facility] [address], [place], [place] [address] Phone: [phone]

## 2019-10-11 NOTE — Progress Notes (Signed)
74 y.o. G0P0000 Divorced White or Caucasian female here for annual exam. Doing well.  Discussed Covid vaccination today.  Ex-husband is in a nursing home.  This has been a stressor for her.  She is actively in [place] and having support.    Had solid food impaction for four days.  She did have a swallowing study showing some narrowing.  Stretching was done.  Saw Mary Krueger last year.  Felt this really helped.    Has new lesion on should she would like me to look at today.  Patient's last menstrual period was 10/04/1984.          Sexually active: No.  The current method of family planning is status post hysterectomy.    Exercising: Yes.    walking Smoker:  no  Health Maintenance: Pap:  2004 normal History of abnormal Pap:  no MMG:  10/23/18 BIRADS 1 negative/density B Colonoscopy:  12/13/17 Normal BMD:   10/23/18 Osteopenia TDaP: UTD with [facility] Pneumonia vaccine(s):  completed Shingrix:   completed Hep C testing:  2017 neg Screening Labs: PCP   reports that she has quit smoking. She has never used smokeless tobacco. She reports that she does not drink alcohol or use drugs.  Past Medical History:  Diagnosis Date  . Anxiety    Chronic  . Breast mass, left 2005   fatty on U/S  . Cataract   . Fibroid 1987   TAH/USO  . Glaucoma   . Hyperlipidemia   . Uses hearing aid    Bilateral   . Vitamin D deficiency     Past Surgical History:  Procedure Laterality Date  . BALLOON DILATION N/A 03/05/2019   Procedure: BALLOON DILATION;  Surgeon: Mauri Pole, MD;  Location: [facility];  Service: Endoscopy;  Laterality: N/A;  TTS balloon dilation  . CATARACT EXTRACTION W/ INTRAOCULAR LENS IMPLANT Bilateral 12/2015 01/2016  . COSMETIC SURGERY     Mini face lift  . ESOPHAGEAL MANOMETRY N/A 03/05/2019   Procedure: ESOPHAGEAL MANOMETRY (EM);  Surgeon: Mauri Pole, MD;  Location: [facility];  Service: Endoscopy;  Laterality: N/A;  . ESOPHAGOGASTRODUODENOSCOPY  01/2019   . ESOPHAGOGASTRODUODENOSCOPY (EGD) WITH PROPOFOL N/A 01/17/2019   Procedure: ESOPHAGOGASTRODUODENOSCOPY (EGD) WITH PROPOFOL;  Surgeon: Jerene Bears, MD;  Location: [facility];  Service: Gastroenterology;  Laterality: N/A;  . ESOPHAGOGASTRODUODENOSCOPY (EGD) WITH PROPOFOL N/A 03/05/2019   Procedure: ESOPHAGOGASTRODUODENOSCOPY (EGD) WITH PROPOFOL;  Surgeon: Mauri Pole, MD;  Location: [facility];  Service: Endoscopy;  Laterality: N/A;  . EYE SURGERY  06/2012   eye lift  . FOREIGN BODY REMOVAL  01/17/2019   Procedure: FOREIGN BODY REMOVAL;  Surgeon: Jerene Bears, MD;  Location: [facility];  Service: Gastroenterology;;  . LIPOMA EXCISION Left 2009   left arm  . STOMACH SURGERY  1997   Sphincter  . TONSILLECTOMY AND ADENOIDECTOMY  1972  . TOTAL ABDOMINAL HYSTERECTOMY W/ BILATERAL SALPINGOOPHORECTOMY    . TRIGGER FINGER RELEASE     right    Current Outpatient Medications  Medication Sig Dispense Refill  . cholecalciferol (VITAMIN D3) 25 MCG (1000 UT) tablet Take 1,000 Units by mouth daily.    Marland Kitchen latanoprost (XALATAN) 0.005 % ophthalmic solution     . meloxicam (MOBIC) 15 MG tablet Take 15 mg by mouth daily.    . metoprolol succinate (TOPROL-XL) 25 MG 24 hr tablet TAKE ONE TABLET BY MOUTH EVERY EVENING 90 tablet 0  . PARoxetine (PAXIL) 40 MG tablet TAKE ONE TABLET BY  MOUTH DAILY 90 tablet 0  . Coenzyme Q10 (COQ10) 100 MG CAPS Take 100 mg by mouth 2 (two) times a day.    . tretinoin (RETIN-A) 0.05 % cream Apply topically at bedtime. (Patient not taking: Reported on 06/21/2019) 45 g 0   No current facility-administered medications for this visit.    Family History  Problem Relation Age of Onset  . Diabetes Mother   . Prostate cancer Father   . Colon cancer Father   . Diabetes Brother   . Prostate cancer Brother   . Colon polyps Neg Hx   . Esophageal cancer Neg Hx   . Stomach cancer Neg Hx   . Rectal cancer Neg Hx     Review of Systems  All other systems reviewed and are  negative.   Exam:   BP 118/70 (BP Location: Left Arm, Patient Position: Sitting, Cuff Size: Normal)   Pulse 84   Temp (!) 96.1 F (35.6 C) (Temporal)   Resp 12   Ht 5\' 5"  (1.651 m)   Wt 144 lb (65.3 kg)   LMP 10/04/1984   BMI 23.96 kg/m   Height: 5\' 5"  (165.1 cm)  Ht Readings from Last 3 Encounters:  10/11/19 5\' 5"  (1.651 m)  06/21/19 5' 4.4" (1.636 m)  03/19/19 5\' 4"  (1.626 m)    General appearance: alert, cooperative and appears stated age Head: Normocephalic, without obvious abnormality, atraumatic Neck: no adenopathy, supple, symmetrical, trachea midline and thyroid normal to inspection and palpation Lungs: clear to auscultation bilaterally Breasts: normal appearance, no masses or tenderness Heart: regular rate and rhythm Abdomen: soft, non-tender; bowel sounds normal; no masses,  no organomegaly Extremities: extremities normal, atraumatic, no cyanosis or edema Skin: Skin color, texture, turgor normal. Slightly erythematous lesion right shoulder with minimal scale Lymph nodes: Cervical, supraclavicular, and axillary nodes normal. No abnormal inguinal nodes palpated Neurologic: Grossly normal   Pelvic: External genitalia:  no lesions              Urethra:  normal appearing urethra with no masses, tenderness or lesions              Bartholins and Skenes: normal                 Vagina: normal appearing vagina with normal color and discharge, no lesions              Cervix: absent              Pap taken: No. Bimanual Exam:  Uterus:  uterus absent              Adnexa: normal adnexa and no mass, fullness, tenderness               Rectovaginal: Confirms               Anus:  normal sphincter tone, no lesions  Chaperone, Terence Lux, CMA, was present for exam.  A:  Well Woman with normal exam PMP, no HRT Hx of alcohol abuse, in AA H/o PTSD and depression Anxiety H/o elevated lipids (cannot tolerate statins) Family hx of colon cancer (father), last colonoscopy  2019 Osteopenia Shoulder lesion--she will use triple antibiotic ointment on this for 7-10 days.  If not improved will see dermatology.  Names and number given for her to call.  P:   Mammogram guidelines reviewed pap smear not indicated Paxil RFs being done with psychiatrist.  No refills needed. Has done blood work with Dr. Baird Cancer earlier this  year.  Pt did not Rear about her results so we reviewed today.  Pt aware she needs follow-up. Cardiology referral discussed for consideration of coronary CT and injection medications for cholesterol Colonoscopy UTD in 2019 Return annually or prn

## 2019-10-23 ENCOUNTER — Other Ambulatory Visit: Payer: Self-pay | Admitting: Obstetrics & Gynecology

## 2019-10-23 DIAGNOSIS — Z1231 Encounter for screening mammogram for malignant neoplasm of breast: Secondary | ICD-10-CM

## 2019-10-26 ENCOUNTER — Other Ambulatory Visit: Payer: Self-pay

## 2019-10-26 ENCOUNTER — Ambulatory Visit
Admission: RE | Admit: 2019-10-26 | Discharge: 2019-10-26 | Disposition: A | Discharge status: Home / Self Care | Payer: Medicare HMO | Source: Ambulatory Visit | Attending: Obstetrics & Gynecology | Admitting: Obstetrics & Gynecology

## 2019-10-26 DIAGNOSIS — Z1231 Encounter for screening mammogram for malignant neoplasm of breast: Secondary | ICD-10-CM | POA: Diagnosis not present

## 2019-11-13 ENCOUNTER — Other Ambulatory Visit: Payer: Self-pay

## 2019-11-13 ENCOUNTER — Encounter: Payer: Self-pay | Admitting: Cardiovascular Disease

## 2019-11-13 ENCOUNTER — Ambulatory Visit: Payer: Medicare HMO | Admitting: Cardiovascular Disease

## 2019-11-13 VITALS — BP 140/70 | HR 79 | Temp 94.8°F | Ht 64.0 in | Wt 148.2 lb

## 2019-11-13 DIAGNOSIS — E7800 Pure hypercholesterolemia, unspecified: Secondary | ICD-10-CM | POA: Diagnosis not present

## 2019-11-13 DIAGNOSIS — I10 Essential (primary) hypertension: Secondary | ICD-10-CM | POA: Diagnosis not present

## 2019-11-13 NOTE — Patient Instructions (Addendum)
Medication Instructions:  START ASPIRIN 81 MG DAILY   *If you need a refill on your cardiac medications before your next appointment, please call your pharmacy*  Lab Work: NONE  If you have labs (blood work) drawn today and your tests are completely normal, you will receive your results only by: Marland Kitchen MyChart Message (if you have MyChart) OR . A paper copy in the mail If you have any lab test that is abnormal or we need to change your treatment, we will call you to review the results.  Testing/Procedures: NONE  Follow-Up: At [facility], you and your health needs are our priority.  As part of our continuing mission to provide you with exceptional heart care, we have created designated Provider Care Teams.  These Care Teams include your primary Cardiologist (physician) and Advanced Practice Providers (APPs -  Physician Assistants and Nurse Practitioners) who all work together to provide you with the care you need, when you need it.  Your next appointment:   6 month(s) You will receive a reminder letter in the mail two months in advance. If you don't receive a letter, please call our office to schedule the follow-up appointment.  The format for your next appointment:   Either In Person or Virtual  Provider:   You may see [facility] or one of the following Advanced Practice Providers on your designated Care Team:    Kerin Ransom, PA-C  [place], [place]  Coletta Memos, [place]  Your physician recommends that you schedule a follow-up appointment in: [place]   Other Instructions  MONITOR AND LOG YOUR BLOOD PRESSURE TWICE A DAY, BRING TO YOUR FOLLOW UP IN 1 MONTH    DASH Eating Plan DASH stands for "Dietary Approaches to Stop Hypertension." The DASH eating plan is a healthy eating plan that has been shown to reduce high blood pressure (hypertension). It may also reduce your risk for type 2 diabetes, heart disease, and stroke. The DASH eating  plan may also help with weight loss. What are tips for following this plan?  General guidelines  Avoid eating more than 2,300 mg (milligrams) of salt (sodium) a day. If you have hypertension, you may need to reduce your sodium intake to 1,500 mg a day.  Limit alcohol intake to no more than 1 drink a day for nonpregnant women and 2 drinks a day for men. One drink equals 12 oz of beer, 5 oz of wine, or 1 oz of hard liquor.  Work with your health care provider to maintain a healthy body weight or to lose weight. Ask what an ideal weight is for you.  Get at least 30 minutes of exercise that causes your heart to beat faster (aerobic exercise) most days of the week. Activities may include walking, swimming, or biking.  Work with your health care provider or diet and nutrition specialist (dietitian) to adjust your eating plan to your individual calorie needs. Reading food labels   Check food labels for the amount of sodium per serving. Choose foods with less than 5 percent of the Daily Value of sodium. Generally, foods with less than 300 mg of sodium per serving fit into this eating plan.  To find whole grains, look for the word "whole" as the first word in the ingredient list. Shopping  Buy products labeled as "low-sodium" or "no salt added."  Buy fresh foods. Avoid canned foods and premade or frozen meals. Cooking  Avoid adding salt when cooking.  Use salt-free seasonings or herbs instead of table salt or sea salt. Check with your health care provider or pharmacist before using salt substitutes.  Do not fry foods. Cook foods using healthy methods such as baking, boiling, grilling, and broiling instead.  Cook with heart-healthy oils, such as olive, canola, soybean, or sunflower oil. Meal planning  Eat a balanced diet that includes: ? 5 or more servings of fruits and vegetables each day. At each meal, try to fill half of your plate with fruits and vegetables. ? Up to 6-8 servings of  whole grains each day. ? Less than 6 oz of lean meat, poultry, or fish each day. A 3-oz serving of meat is about the same size as a deck of cards. One egg equals 1 oz. ? 2 servings of low-fat dairy each day. ? A serving of nuts, seeds, or beans 5 times each week. ? Heart-healthy fats. Healthy fats called Omega-3 fatty acids are found in foods such as flaxseeds and coldwater fish, like sardines, salmon, and mackerel.  Limit how much you eat of the following: ? Canned or prepackaged foods. ? Food that is high in trans fat, such as fried foods. ? Food that is high in saturated fat, such as fatty meat. ? Sweets, desserts, sugary drinks, and other foods with added sugar. ? Full-fat dairy products.  Do not salt foods before eating.  Try to eat at least 2 vegetarian meals each week.  Eat more home-cooked food and less restaurant, buffet, and fast food.  When eating at a restaurant, ask that your food be prepared with less salt or no salt, if possible. What foods are recommended? The items listed may not be a complete list. Talk with your dietitian about what dietary choices are best for you. Grains Whole-grain or whole-wheat bread. Whole-grain or whole-wheat pasta. Brown rice. [place]. Bulgur. Whole-grain and low-sodium cereals. Pita bread. Low-fat, low-sodium crackers. Whole-wheat flour tortillas. Vegetables Fresh or frozen vegetables (raw, steamed, roasted, or grilled). Low-sodium or reduced-sodium tomato and vegetable juice. Low-sodium or reduced-sodium tomato sauce and tomato paste. Low-sodium or reduced-sodium canned vegetables. Fruits All fresh, dried, or frozen fruit. Canned fruit in natural juice (without added sugar). Meat and other protein foods Skinless chicken or [place]. Ground chicken or [place]. Pork with fat trimmed off. Fish and seafood. Egg whites. Dried beans, peas, or lentils. Unsalted nuts, nut butters, and seeds. Unsalted canned beans. Lean cuts of beef with fat  trimmed off. Low-sodium, lean deli meat. Dairy Low-fat (1%) or fat-free (skim) milk. Fat-free, low-fat, or reduced-fat cheeses. Nonfat, low-sodium ricotta or cottage cheese. Low-fat or nonfat yogurt. Low-fat, low-sodium cheese. Fats and oils Soft margarine without trans fats. Vegetable oil. Low-fat, reduced-fat, or light mayonnaise and salad dressings (reduced-sodium). Canola, safflower, olive, soybean, and sunflower oils. Avocado. Seasoning and other foods Herbs. Spices. Seasoning mixes without salt. Unsalted popcorn and pretzels. Fat-free sweets. What foods are not recommended? The items listed may not be a complete list. Talk with your dietitian about what dietary choices are best for you. Grains Baked goods made with fat, such as croissants, muffins, or some breads. Dry pasta or rice meal packs. Vegetables Creamed or fried vegetables. Vegetables in a cheese sauce. Regular canned vegetables (not low-sodium or reduced-sodium). Regular canned tomato sauce and paste (not low-sodium or reduced-sodium). Regular tomato and vegetable juice (not low-sodium or reduced-sodium). Angie Fava. Olives. Fruits Canned fruit in a light or heavy syrup. Fried fruit. Fruit in cream or butter sauce. Meat and other protein foods Fatty  cuts of meat. Ribs. Fried meat. Berniece Salines. Sausage. Bologna and other processed lunch meats. Salami. Fatback. Hotdogs. Bratwurst. Salted nuts and seeds. Canned beans with added salt. Canned or smoked fish. Whole eggs or egg yolks. Chicken or [place] with skin. Dairy Whole or 2% milk, cream, and half-and-half. Whole or full-fat cream cheese. Whole-fat or sweetened yogurt. Full-fat cheese. Nondairy creamers. Whipped toppings. Processed cheese and cheese spreads. Fats and oils Butter. Stick margarine. Lard. Shortening. Ghee. Bacon fat. Tropical oils, such as coconut, palm kernel, or palm oil. Seasoning and other foods Salted popcorn and pretzels. Onion salt, garlic salt, seasoned salt, table  salt, and sea salt. Worcestershire sauce. Tartar sauce. Barbecue sauce. Teriyaki sauce. Soy sauce, including reduced-sodium. Steak sauce. Canned and packaged gravies. Fish sauce. Oyster sauce. Cocktail sauce. Horseradish that you find on the shelf. Ketchup. Mustard. Meat flavorings and tenderizers. Bouillon cubes. Hot sauce and Tabasco sauce. Premade or packaged marinades. Premade or packaged taco seasonings. Relishes. Regular salad dressings. Where to find more information:  National Heart, Lung, and [place]: https://wilson-eaton.com/  American Heart Association: www.heart.org Summary  The DASH eating plan is a healthy eating plan that has been shown to reduce high blood pressure (hypertension). It may also reduce your risk for type 2 diabetes, heart disease, and stroke.  With the DASH eating plan, you should limit salt (sodium) intake to 2,300 mg a day. If you have hypertension, you may need to reduce your sodium intake to 1,500 mg a day.  When on the DASH eating plan, aim to eat more fresh fruits and vegetables, whole grains, lean proteins, low-fat dairy, and heart-healthy fats.  Work with your health care provider or diet and nutrition specialist (dietitian) to adjust your eating plan to your individual calorie needs. This information is not intended to replace advice given to you by your health care provider. Make sure you discuss any questions you have with your health care provider. Document Revised: 09/02/2017 Document Reviewed: 09/13/2016 Elsevier Patient Education  2020 [employer].

## 2019-11-13 NOTE — Progress Notes (Signed)
Cardiology Office Note   Date:  11/13/2019   ID:  [name], DOB 1946-09-30, MRN [id number]  PCP:  Glendale Chard, MD  Cardiologist:   Skeet Latch, MD   No chief complaint on file.    History of Present Illness: Mary Krueger is a 74 y.o. 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  Mary Krueger reports that she has had hyperlipidemia for many years.  She previously tried atorvastatin, rosuvastatin, and simvastatin but did not tolerate them due to myalgias.  She does try to walk couple miles daily and has no exertional chest pain or shortness of breath.  She also denies lower extremity edema, orthopnea, or PND.  Overall she feels pretty well other than musculoskeletal tightness in her shoulders.  It does not occur with exertion.  She reports that her diet is "terrible."  She eats a lot of processed foods and adds salt to her meals.    She was treated for a food impaction 10/2019 and required esophageal dilation.  She previously saw Dr. Percival Spanish 10/2016 for palpitations.  No further testing was needed at that time.  She denies any palpitations recently.  She denies any family history of heart attacks, stroke, or sudden cardiac death.   Past Medical History:  Diagnosis Date  . Anxiety    Chronic  . Breast mass, left 2005   fatty on U/S  . Cataract   . Fibroid 1987   TAH/USO  . Glaucoma   . Hyperlipidemia   . Uses hearing aid    Bilateral   . Vitamin D deficiency     Past Surgical History:  Procedure Laterality Date  . BALLOON DILATION N/A 03/05/2019   Procedure: BALLOON DILATION;  Surgeon: Mauri Pole, MD;  Location: [facility];  Service: Endoscopy;  Laterality: N/A;  TTS balloon dilation  . CATARACT EXTRACTION W/ INTRAOCULAR LENS IMPLANT Bilateral 12/2015 01/2016  . COSMETIC SURGERY     Mini face lift  . ESOPHAGEAL MANOMETRY N/A 03/05/2019    Procedure: ESOPHAGEAL MANOMETRY (EM);  Surgeon: Mauri Pole, MD;  Location: [facility];  Service: Endoscopy;  Laterality: N/A;  . ESOPHAGOGASTRODUODENOSCOPY  01/2019  . ESOPHAGOGASTRODUODENOSCOPY (EGD) WITH PROPOFOL N/A 01/17/2019   Procedure: ESOPHAGOGASTRODUODENOSCOPY (EGD) WITH PROPOFOL;  Surgeon: Jerene Bears, MD;  Location: [facility];  Service: Gastroenterology;  Laterality: N/A;  . ESOPHAGOGASTRODUODENOSCOPY (EGD) WITH PROPOFOL N/A 03/05/2019   Procedure: ESOPHAGOGASTRODUODENOSCOPY (EGD) WITH PROPOFOL;  Surgeon: Mauri Pole, MD;  Location: [facility];  Service: Endoscopy;  Laterality: N/A;  . EYE SURGERY  06/2012   eye lift  . FOREIGN BODY REMOVAL  01/17/2019   Procedure: FOREIGN BODY REMOVAL;  Surgeon: Jerene Bears, MD;  Location: [facility] ENDOSCOPY;  Service: Gastroenterology;;  . LIPOMA EXCISION Left 2009   left arm  . STOMACH SURGERY  1997   Sphincter  . TONSILLECTOMY AND ADENOIDECTOMY  1972  . TOTAL ABDOMINAL HYSTERECTOMY W/ BILATERAL SALPINGOOPHORECTOMY    . TRIGGER FINGER RELEASE     right     Current Outpatient Medications  Medication Sig Dispense Refill  . aspirin EC 81 MG tablet Take 81 mg by mouth daily.    . cholecalciferol (VITAMIN D3) 25 MCG (1000 UT) tablet Take 1,000 Units by mouth daily.    . Coenzyme Q10 (COQ10) 100 MG CAPS Take 100 mg by mouth 2 (two) times a day.    Marland Kitchen  latanoprost (XALATAN) 0.005 % ophthalmic solution     . meloxicam (MOBIC) 15 MG tablet Take 15 mg by mouth daily.    . metoprolol succinate (TOPROL-XL) 25 MG 24 hr tablet TAKE ONE TABLET BY MOUTH EVERY EVENING 90 tablet 0  . PARoxetine (PAXIL) 40 MG tablet TAKE ONE TABLET BY MOUTH DAILY 90 tablet 0  . tretinoin (RETIN-A) 0.05 % cream Apply topically at bedtime. 45 g 0   No current facility-administered medications for this visit.    Allergies:   Atorvastatin and Rosuvastatin    Social History:  The patient  reports that she has quit smoking. She has never used smokeless  tobacco. She reports that she does not drink alcohol or use drugs.   Family History:  The patient's family history includes Colon cancer in her father; Diabetes in her brother and mother; Prostate cancer in her brother and father.    ROS:  Please see the history of present illness.   Otherwise, review of systems are positive for none.   All other systems are reviewed and negative.    PHYSICAL EXAM: VS:  BP 140/70   Pulse 79   Temp (!) 94.8 F (34.9 C)   Ht 5\' 4"  (1.626 m)   Wt 148 lb 3.2 oz (67.2 kg)   LMP 10/04/1984   SpO2 99%   BMI 25.44 kg/m  , BMI Body mass index is 25.44 kg/m. GENERAL:  Well appearing HEENT:  Pupils equal round and reactive, fundi not visualized, oral mucosa unremarkable NECK:  No jugular venous distention, waveform within normal limits, carotid upstroke brisk and symmetric, no bruits LUNGS:  Clear to auscultation bilaterally HEART:  RRR.  PMI not displaced or sustained,S1 and S2 within normal limits, no S3, no S4, no clicks, no rubs, no murmurs ABD:  Flat, positive bowel sounds normal in frequency in pitch, no bruits, no rebound, no guarding, no midline pulsatile mass, no hepatomegaly, no splenomegaly EXT:  2 plus pulses throughout, no edema, no cyanosis no clubbing SKIN:  No rashes no nodules NEURO:  Cranial nerves II through XII grossly intact, motor grossly intact throughout PSYCH:  Cognitively intact, oriented to person place and time   EKG:  EKG is not ordered today.   Recent Labs: 06/21/2019: ALT 12; BUN 17; Creatinine, Ser 0.88; Hemoglobin 14.6; Platelets 353; Potassium 4.4; Sodium 142; TSH 3.310    Lipid Panel    Component Value Date/Time   CHOL 301 (H) 06/21/2019 1201   TRIG 113 06/21/2019 1201   HDL 67 06/21/2019 1201   CHOLHDL 4.5 (H) 06/21/2019 1201   CHOLHDL 4.4 CALC 10/24/2008 0827   VLDL 20 10/24/2008 0827   LDLCALC 214 (H) 06/21/2019 1201   LDLDIRECT 166.6 10/24/2008 0827      Wt Readings from Last 3 Encounters:  11/13/19 148  lb 3.2 oz (67.2 kg)  10/11/19 144 lb (65.3 kg)  06/21/19 139 lb 12.8 oz (63.4 kg)      ASSESSMENT AND PLAN:  # Hyperlipidemia:  Lipids are very elevated.  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  However she does not have a very strong family history of cardiovascular disease.  She reports that her diet is very poor.  She is going to work on limiting her fried foods, fatty foods, and red meat.  She is also going to work on following the [employer] as her blood pressure has been elevated.  Given her statin intolerance we will refer her to the pharmacists to consider PCSK9  inhibitors.  Lipids were not controlled on Zetia.  # Elevated BP: BP is elevated here but has been generally controlled at other visits.  She is unsure what it is running at home.  She is going to get a blood pressure cuff and check twice daily.  She is also going to reduce her sodium intake as above.  Follow-up with pharmacist in 1 month.  Continue metoprolol for now.  She may at least benefit from switching to carvedilol or nebivolol.  Current medicines are reviewed at length with the patient today.  The patient does not have concerns regarding medicines.  The following changes have been made:  no change  Labs/ tests ordered today include:  No orders of the defined types were placed in this encounter.    Disposition:   FU with PharmD in 1 month.  Marybell Robards C. Oval Linsey, MD, [facility] in 6 months.      Signed, Monzerrat Wellen C. Oval Linsey, MD, [facility]  11/13/2019 1:08 PM    [place]

## 2019-11-16 ENCOUNTER — Other Ambulatory Visit: Payer: Self-pay | Admitting: Internal Medicine

## 2019-12-11 ENCOUNTER — Ambulatory Visit (INDEPENDENT_AMBULATORY_CARE_PROVIDER_SITE_OTHER): Payer: Medicare HMO | Admitting: Pharmacist Clinician (PhC)/ Clinical Pharmacy Specialist

## 2019-12-11 ENCOUNTER — Other Ambulatory Visit: Payer: Self-pay

## 2019-12-11 ENCOUNTER — Encounter: Payer: Self-pay | Admitting: Pharmacist Clinician (PhC)/ Clinical Pharmacy Specialist

## 2019-12-11 VITALS — BP 136/74 | HR 84 | Wt 143.0 lb

## 2019-12-11 DIAGNOSIS — I10 Essential (primary) hypertension: Secondary | ICD-10-CM

## 2019-12-11 DIAGNOSIS — T466X5A Adverse effect of antihyperlipidemic and antiarteriosclerotic drugs, initial encounter: Secondary | ICD-10-CM | POA: Diagnosis not present

## 2019-12-11 DIAGNOSIS — G720 Drug-induced myopathy: Secondary | ICD-10-CM | POA: Diagnosis not present

## 2019-12-11 NOTE — Assessment & Plan Note (Signed)
Patient has familial hyperlipidemia, though no strong history of ASCVD.  Has tried 2 different statins with no success.  Reviewed information on PCKS-9 inhibitors and will start the PA process to get one covered on her insurance.  Reviewed dosing, side effects, mechanism of action and expected LDL drop.  Answered all questions.  Once approved, patient can contact [place] for assistance with copays and any deductible that may exist.  Will have her repeat labs in 3 months.

## 2019-12-11 NOTE — Assessment & Plan Note (Signed)
Patient appears to have an element of white coat hypertension.  Home readings show [id number] < [id number] systolic and all diastolic WNL.  She will continue to monitor this several times each month.

## 2019-12-11 NOTE — Progress Notes (Signed)
12/11/2019 Mary Krueger 10-03-1946 [id number]   HPI:  Mary Krueger is a 74 y.o. female patient of Dr Oval Linsey, with a [place] below who presents today for hypertension and hyperlipidemia clinic evaluation.  Other than these, her medical history is significant only for some chronic right shoulder pain and esophageal dysphagia.  At her last visit with Dr. Oval Linsey it was suggested that she purchase a home BP monitor, as her pressure was elevated at 140/70.  No medications were started, as it was needed to determine if this was more of a white coat issue or not.   Her most recent lipid labs show a familial hyperlipidemia, with LDL at 214.  She is statin intolerant and has never had a cardiac event or stroke.    Patient returns today and is feeling well.  She purchased a home BP cuff and checked it several times in the past month.   See below.  She would like to get her cholesterol levels lower, however does have concerns about costs of branded medications.    Blood Pressure Goal:  130/80  Current Medications: metoprolol succ 25 mg qd  Family Hx: mother died at 57, dad at 28 cancer; only half siblings  Social Hx:  No tobacco or alcohol, 1 coffee in am, occasional diet coke at lunch  Diet: frozen foods almost always, organic burritos, fruits daily, plenty of vegetables, avoids milk products; snacks mandarin oranges, melons, sugar free chocolate pops  Exercise: walks regularly, likes to do home maintenance  Home BP readings:  10 readings with her today, average systolic [id number], range of 0000000.  All diastolic readings were WNL  Intolerances: atorvastatin and rosuvastatin caused myalgias  Labs: 1/21:  TC 301, TG 113, HDL 67, LDL 214 (no medications)  Wt Readings from Last 3 Encounters:  12/11/19 143 lb (64.9 kg)  11/13/19 148 lb 3.2 oz (67.2 kg)  10/11/19 144 lb (65.3 kg)   BP Readings from Last 3 Encounters:  12/11/19 136/74  11/13/19 140/70  10/11/19 118/70   Pulse Readings  from Last 3 Encounters:  12/11/19 84  11/13/19 79  10/11/19 84    Current Outpatient Medications  Medication Sig Dispense Refill  . aspirin EC 81 MG tablet Take 81 mg by mouth daily.    . cholecalciferol (VITAMIN D3) 25 MCG (1000 UT) tablet Take 1,000 Units by mouth daily.    Marland Kitchen latanoprost (XALATAN) 0.005 % ophthalmic solution     . meloxicam (MOBIC) 15 MG tablet Take 15 mg by mouth as needed.     . metoprolol succinate (TOPROL-XL) 25 MG 24 hr tablet TAKE ONE TABLET BY MOUTH EVERY EVENING 90 tablet 0  . PARoxetine (PAXIL) 40 MG tablet TAKE ONE TABLET BY MOUTH DAILY 90 tablet 0   No current facility-administered medications for this visit.    Allergies  Allergen Reactions  . Atorvastatin Other (See Comments)    myalgias  . Rosuvastatin Other (See Comments)    myalgias    Past Medical History:  Diagnosis Date  . Anxiety    Chronic  . Breast mass, left 2005   fatty on U/S  . Cataract   . Fibroid 1987   TAH/USO  . Glaucoma   . Hyperlipidemia   . Uses hearing aid    Bilateral   . Vitamin D deficiency     Blood pressure 136/74, pulse 84, weight 143 lb (64.9 kg), last menstrual period 10/04/1984.  Essential hypertension Patient appears to have an element of  white coat hypertension.  Home readings show [id number] < [id number] systolic and all diastolic WNL.  She will continue to monitor this several times each month.    HYPERLIPIDEMIA Patient has familial hyperlipidemia, though no strong history of ASCVD.  Has tried 2 different statins with no success.  Reviewed information on PCKS-9 inhibitors and will start the PA process to get one covered on her insurance.  Reviewed dosing, side effects, mechanism of action and expected LDL drop.  Answered all questions.  Once approved, patient can contact [place] for assistance with copays and any deductible that may exist.  Will have her repeat labs in 3 months.     Tommy Medal PharmD CPP [facility]  [facility] [address] [place], [place] [address] [phone]

## 2019-12-11 NOTE — Patient Instructions (Addendum)
Your Results:             Your most recent labs Goal  Total Cholesterol 301 < 200  Triglycerides 113 < 150  HDL (happy/good cholesterol) 67 > 40  LDL (lousy/bad cholesterol 214 < 100   Medication changes:  We will start the paperwork to get Repatha or Praluent covered by your insurance.  [place] Silos will give you a call once it is approved.  Patient Assistance:  The [employer] foundation offers assistance to help pay for medication copays.  They will cover copays for all cholesterol lowering meds, including statins, fibrates, omega-3 oils, ezetimibe, Repatha, Praluent, Nexletol, Nexlizet.  The cards are usually good for $2,500 or 12 months, whichever comes first. 1. Go to healthwellfoundation.org 2. Click on "Apply Now" 3. Answer questions as to whom is applying (patient or representative) 4. Your disease fund will be "hypercholesterolemia - Medicare access" 5. They will ask questions about finances and which medications you are taking for cholesterol 6. When you submit, the approval is usually within minutes.  You will need to print the card information from the site 7. You will need to show this information to your pharmacy, they will bill your Medicare Part D plan first -then bill [employer] --for the copay.   You can also call them at [phone], although the hold times can be quite long.   Thank you for choosing [facility]

## 2019-12-26 ENCOUNTER — Ambulatory Visit (INDEPENDENT_AMBULATORY_CARE_PROVIDER_SITE_OTHER): Payer: Medicare HMO

## 2019-12-26 ENCOUNTER — Encounter: Payer: Self-pay | Admitting: Internal Medicine

## 2019-12-26 ENCOUNTER — Ambulatory Visit (INDEPENDENT_AMBULATORY_CARE_PROVIDER_SITE_OTHER): Payer: Medicare HMO | Admitting: Internal Medicine

## 2019-12-26 ENCOUNTER — Other Ambulatory Visit: Payer: Self-pay

## 2019-12-26 VITALS — BP 132/70 | HR 76 | Temp 97.9°F | Ht 63.4 in | Wt 142.6 lb

## 2019-12-26 DIAGNOSIS — G8929 Other chronic pain: Secondary | ICD-10-CM | POA: Diagnosis not present

## 2019-12-26 DIAGNOSIS — I10 Essential (primary) hypertension: Secondary | ICD-10-CM

## 2019-12-26 DIAGNOSIS — M25511 Pain in right shoulder: Secondary | ICD-10-CM | POA: Diagnosis not present

## 2019-12-26 DIAGNOSIS — Z79899 Other long term (current) drug therapy: Secondary | ICD-10-CM | POA: Diagnosis not present

## 2019-12-26 DIAGNOSIS — Z0000 Encounter for general adult medical examination without abnormal findings: Secondary | ICD-10-CM | POA: Diagnosis not present

## 2019-12-26 DIAGNOSIS — R69 Illness, unspecified: Secondary | ICD-10-CM | POA: Diagnosis not present

## 2019-12-26 DIAGNOSIS — E782 Mixed hyperlipidemia: Secondary | ICD-10-CM | POA: Diagnosis not present

## 2019-12-26 DIAGNOSIS — F411 Generalized anxiety disorder: Secondary | ICD-10-CM | POA: Diagnosis not present

## 2019-12-26 LAB — POCT URINALYSIS DIPSTICK
Blood, UA: NEGATIVE
Glucose, UA: NEGATIVE
Nitrite, UA: NEGATIVE
Protein, UA: POSITIVE — AB
Spec Grav, UA: 1.025 (ref 1.010–1.025)
Urobilinogen, UA: 1 E.U./dL
pH, UA: 6 (ref 5.0–8.0)

## 2019-12-26 LAB — POCT UA - MICROALBUMIN
Creatinine, POC: 300 mg/dL
Microalbumin Ur, POC: 80 mg/L

## 2019-12-26 NOTE — Patient Instructions (Signed)
Mary Krueger , Thank you for taking time to come for your Medicare Wellness Visit. I appreciate your ongoing commitment to your health goals. Please review the following plan we discussed and let me know if I can assist you in the future.   Screening recommendations/referrals: Colonoscopy: 12/2017 Mammogram: 10/2019 Bone Density: 10/2018 Recommended yearly ophthalmology/optometry visit for glaucoma screening and checkup Recommended yearly dental visit for hygiene and checkup  Vaccinations: Influenza vaccine: 06/2019 Pneumococcal vaccine: 03/2010 Tdap vaccine: 05/2013 Shingles vaccine: discussed    Advanced directives: Please bring a copy of your POA (Power of [place]) and/or Living Will to your next appointment.    Conditions/risks identified: none  Next appointment: 07/01/2020 at 11:00   Preventive Care 76 Years and Older, Female Preventive care refers to lifestyle choices and visits with your health care provider that can promote health and wellness. What does preventive care include?  A yearly physical exam. This is also called an annual well check.  Dental exams once or twice a year.  Routine eye exams. Ask your health care provider how often you should have your eyes checked.  Personal lifestyle choices, including:  Daily care of your teeth and gums.  Regular physical activity.  Eating a healthy diet.  Avoiding tobacco and drug use.  Limiting alcohol use.  Practicing safe sex.  Taking low-dose aspirin every day.  Taking vitamin and mineral supplements as recommended by your health care provider. What happens during an annual well check? The services and screenings done by your health care provider during your annual well check will depend on your age, overall health, lifestyle risk factors, and family history of disease. Counseling  Your health care provider may ask you questions about your:  Alcohol use.  Tobacco use.  Drug use.  Emotional  well-being.  Home and relationship well-being.  Sexual activity.  Eating habits.  History of falls.  Memory and ability to understand (cognition).  Work and work Statistician.  Reproductive health. Screening  You may have the following tests or measurements:  Height, weight, and BMI.  Blood pressure.  Lipid and cholesterol levels. These may be checked every 5 years, or more frequently if you are over 24 years old.  Skin check.  Lung cancer screening. You may have this screening every year starting at age 76 if you have a 30-pack-year history of smoking and currently smoke or have quit within the past 15 years.  Fecal occult blood test (FOBT) of the stool. You may have this test every year starting at age 76.  Flexible sigmoidoscopy or colonoscopy. You may have a sigmoidoscopy every 5 years or a colonoscopy every 10 years starting at age 76.  Hepatitis C blood test.  Hepatitis B blood test.  Sexually transmitted disease (STD) testing.  Diabetes screening. This is done by checking your blood sugar (glucose) after you have not eaten for a while (fasting). You may have this done every 1-3 years.  Bone density scan. This is done to screen for osteoporosis. You may have this done starting at age 76.  Mammogram. This may be done every 1-2 years. Talk to your health care provider about how often you should have regular mammograms. Talk with your health care provider about your test results, treatment options, and if necessary, the need for more tests. Vaccines  Your health care provider may recommend certain vaccines, such as:  Influenza vaccine. This is recommended every year.  Tetanus, diphtheria, and acellular pertussis (Tdap, Td) vaccine. You may need a Td booster  every 10 years.  Zoster vaccine. You may need this after age 80.  Pneumococcal 13-valent conjugate (PCV13) vaccine. One dose is recommended after age 76.  Pneumococcal polysaccharide (PPSV23) vaccine. One  dose is recommended after age 62. Talk to your health care provider about which screenings and vaccines you need and how often you need them. This information is not intended to replace advice given to you by your health care provider. Make sure you discuss any questions you have with your health care provider. Document Released: 10/17/2015 Document Revised: 06/09/2016 Document Reviewed: 07/22/2015 Elsevier Interactive Patient Education  2017 [place] Prevention in the Home Falls can cause injuries. They can happen to people of all ages. There are many things you can do to make your home safe and to help prevent falls. What can I do on the outside of my home?  Regularly fix the edges of walkways and driveways and fix any cracks.  Remove anything that might make you trip as you walk through a door, such as a raised step or threshold.  Trim any bushes or trees on the path to your home.  Use bright outdoor lighting.  Clear any walking paths of anything that might make someone trip, such as rocks or tools.  Regularly check to see if handrails are loose or broken. Make sure that both sides of any steps have handrails.  Any raised decks and porches should have guardrails on the edges.  Have any leaves, snow, or ice cleared regularly.  Use sand or salt on walking paths during winter.  Clean up any spills in your garage right away. This includes oil or grease spills. What can I do in the bathroom?  Use night lights.  Install grab bars by the toilet and in the tub and shower. Do not use towel bars as grab bars.  Use non-skid mats or decals in the tub or shower.  If you need to sit down in the shower, use a plastic, non-slip stool.  Keep the floor dry. Clean up any water that spills on the floor as soon as it happens.  Remove soap buildup in the tub or shower regularly.  Attach bath mats securely with double-sided non-slip rug tape.  Do not have throw rugs and other  things on the floor that can make you trip. What can I do in the bedroom?  Use night lights.  Make sure that you have a light by your bed that is easy to reach.  Do not use any sheets or blankets that are too big for your bed. They should not hang down onto the floor.  Have a firm chair that has side arms. You can use this for support while you get dressed.  Do not have throw rugs and other things on the floor that can make you trip. What can I do in the kitchen?  Clean up any spills right away.  Avoid walking on wet floors.  Keep items that you use a lot in easy-to-reach places.  If you need to reach something above you, use a strong step stool that has a grab bar.  Keep electrical cords out of the way.  Do not use floor polish or wax that makes floors slippery. If you must use wax, use non-skid floor wax.  Do not have throw rugs and other things on the floor that can make you trip. What can I do with my stairs?  Do not leave any items on the stairs.  Make  sure that there are handrails on both sides of the stairs and use them. Fix handrails that are broken or loose. Make sure that handrails are as long as the stairways.  Check any carpeting to make sure that it is firmly attached to the stairs. Fix any carpet that is loose or worn.  Avoid having throw rugs at the top or bottom of the stairs. If you do have throw rugs, attach them to the floor with carpet tape.  Make sure that you have a light switch at the top of the stairs and the bottom of the stairs. If you do not have them, ask someone to add them for you. What else can I do to help prevent falls?  Wear shoes that:  Do not have high heels.  Have rubber bottoms.  Are comfortable and fit you well.  Are closed at the toe. Do not wear sandals.  If you use a stepladder:  Make sure that it is fully opened. Do not climb a closed stepladder.  Make sure that both sides of the stepladder are locked into place.  Ask  someone to hold it for you, if possible.  Clearly mark and make sure that you can see:  Any grab bars or handrails.  First and last steps.  Where the edge of each step is.  Use tools that help you move around (mobility aids) if they are needed. These include:  Canes.  Walkers.  Scooters.  Crutches.  Turn on the lights when you go into a dark area. Replace any light bulbs as soon as they burn out.  Set up your furniture so you have a clear path. Avoid moving your furniture around.  If any of your floors are uneven, fix them.  If there are any pets around you, be aware of where they are.  Review your medicines with your doctor. Some medicines can make you feel dizzy. This can increase your chance of falling. Ask your doctor what other things that you can do to help prevent falls. This information is not intended to replace advice given to you by your health care provider. Make sure you discuss any questions you have with your health care provider. Document Released: 07/17/2009 Document Revised: 02/26/2016 Document Reviewed: 10/25/2014 Elsevier Interactive Patient Education  2017 [employer].

## 2019-12-26 NOTE — Progress Notes (Signed)
This visit occurred during the SARS-CoV-2 public health emergency.  Safety protocols were in place, including screening questions prior to the visit, additional usage of staff PPE, and extensive cleaning of exam room while observing appropriate contact time as indicated for disinfecting solutions.  Subjective:   Mary Krueger is a 74 y.o. female who presents for Medicare Annual (Subsequent) preventive examination.  Review of Systems:  n/a Cardiac Risk Factors include: advanced age (>50men, >74 women);dyslipidemia;hypertension     Objective:     Vitals: BP 132/70 (BP Location: Left Arm, Patient Position: Sitting, Cuff Size: Normal)   Pulse 76   Temp 97.9 F (36.6 C) (Oral)   Ht 5' 3.4" (1.61 m)   Wt 142 lb 9.6 oz (64.7 kg)   LMP 10/04/1984   SpO2 97%   BMI 24.94 kg/m   Body mass index is 24.94 kg/m.  Advanced Directives 12/26/2019 03/02/2019 12/14/2018  Does Patient Have a Medical Advance Directive? Yes Yes Yes  Type of Paramedic of [place];Living will Living will Living will  Does patient want to make changes to medical advance directive? - - No - Patient declined  Copy of [place] in Chart? No - copy requested No - copy requested -    Tobacco Social History   Tobacco Use  Smoking Status Former Smoker  Smokeless Tobacco Never Used  Tobacco Comment   quit in 1981     Counseling given: Not Answered Comment: quit in 1981   Clinical Intake:  Pre-visit preparation completed: Yes  Pain : 0-10 Pain Score: 2  Pain Type: Chronic pain Pain Location: Shoulder Pain Orientation: Right, Left Pain Radiating Towards: none Pain Descriptors / Indicators: Aching Pain Onset: More than a month ago Pain Frequency: Constant Pain Relieving Factors: meloxicam  Pain Relieving Factors: meloxicam  Nutritional Status: BMI of 19-24  Normal Nutritional Risks: None Diabetes: No  How often do you need to have someone help you when you  read instructions, pamphlets, or other written materials from your doctor or pharmacy?: 1 - Never What is the last grade level you completed in school?: 2 years college  Interpreter Needed?: No  Information entered by :: NAllen LPN  Past Medical History:  Diagnosis Date  . Anxiety    Chronic  . Breast mass, left 2005   fatty on U/S  . Cataract   . Fibroid 1987   TAH/USO  . Glaucoma   . Hyperlipidemia   . Uses hearing aid    Bilateral   . Vitamin D deficiency    Past Surgical History:  Procedure Laterality Date  . BALLOON DILATION N/A 03/05/2019   Procedure: BALLOON DILATION;  Surgeon: Mauri Pole, MD;  Location: [facility] END[facility]COPY;  Service: Endoscopy;  Laterality: N/A;  TTS balloon dilation  . CATARACT EXTRACTION W/ INTRAOCULAR LENS IMPLANT Bilateral 12/2015 01/2016  . COSMETIC SURGERY     Mini face lift  . ESOPHAGEAL MANOMETRY N/A 03/05/2019   Procedure: ESOPHAGEAL MANOMETRY (EM);  Surgeon: Mauri Pole, MD;  Location: [facility];  Service: Endoscopy;  Laterality: N/A;  . ESOPHAGOGASTRODUODENOSCOPY  01/2019  . ESOPHAGOGASTRODUODENOSCOPY (EGD) WITH PROPOFOL N/A 01/17/2019   Procedure: ESOPHAGOGASTRODUODENOSCOPY (EGD) WITH PROPOFOL;  Surgeon: Jerene Bears, MD;  Location: [place];  Service: Gastroenterology;  Laterality: N/A;  . ESOPHAGOGASTRODUODENOSCOPY (EGD) WITH PROPOFOL N/A 03/05/2019   Procedure: ESOPHAGOGASTRODUODENOSCOPY (EGD) WITH PROPOFOL;  Surgeon: Mauri Pole, MD;  Location: [facility];  Service: Endoscopy;  Laterality: N/A;  . EYE SURGERY  06/2012  eye lift  . FOREIGN BODY REMOVAL  01/17/2019   Procedure: FOREIGN BODY REMOVAL;  Surgeon: Jerene Bears, MD;  Location: [facility] ENDOSCOPY;  Service: Gastroenterology;;  . LIPOMA EXCISION Left 2009   left arm  . STOMACH SURGERY  1997   Sphincter  . TONSILLECTOMY AND ADENOIDECTOMY  1972  . TOTAL ABDOMINAL HYSTERECTOMY W/ BILATERAL SALPINGOOPHORECTOMY    . TRIGGER FINGER RELEASE     right   Family  History  Problem Relation Age of Onset  . Diabetes Mother   . Prostate cancer Father   . Colon cancer Father   . Diabetes Brother   . Prostate cancer Brother   . Colon polyps Neg Hx   . Esophageal cancer Neg Hx   . Stomach cancer Neg Hx   . Rectal cancer Neg Hx    Social History   Socioeconomic History  . Marital status: Divorced    Spouse name: Not on file  . Number of children: Not on file  . Years of education: Not on file  . Highest education level: Not on file  Occupational History  . Occupation: retired  Tobacco Use  . Smoking status: Former Research scientist (life sciences)  . Smokeless tobacco: Never Used  . Tobacco comment: quit in 1981  Substance and Sexual Activity  . Alcohol use: No    Alcohol/week: 0.0 standard drinks  . Drug use: No  . Sexual activity: Not Currently    Partners: Male    Birth control/protection: Post-menopausal, Surgical  Other Topics Concern  . Not on file  Social History Narrative   Lives alone.     Social Determinants of Health   Financial Resource Strain: Low Risk   . Difficulty of Paying Living Expenses: Not hard at all  Food Insecurity: No Food Insecurity  . Worried About Charity fundraiser in the Last Year: Never true  . Ran Out of Food in the Last Year: Never true  Transportation Needs: No Transportation Needs  . Lack of Transportation (Medical): No  . Lack of Transportation (Non-Medical): No  Physical Activity: Insufficiently Active  . Days of Exercise per Week: 2 days  . Minutes of Exercise per Session: 40 min  Stress: No Stress Concern Present  . Feeling of Stress : Not at all  Social Connections:   . Frequency of Communication with Friends and Family:   . Frequency of Social Gatherings with Friends and Family:   . Attends Religious Services:   . Active Member of Clubs or Organizations:   . Attends Archivist Meetings:   Marland Kitchen Marital Status:     Outpatient Encounter Medications as of 12/26/2019  Medication Sig  . aspirin EC 81 MG  tablet Take 81 mg by mouth daily.  . cholecalciferol (VITAMIN D3) 25 MCG (1000 UT) tablet Take 1,000 Units by mouth daily.  Marland Kitchen latanoprost (XALATAN) 0.005 % ophthalmic solution   . meloxicam (MOBIC) 15 MG tablet Take 15 mg by mouth as needed.   . metoprolol succinate (TOPROL-XL) 25 MG 24 hr tablet TAKE ONE TABLET BY MOUTH EVERY EVENING  . PARoxetine (PAXIL) 40 MG tablet TAKE ONE TABLET BY MOUTH DAILY   No facility-administered encounter medications on file as of 12/26/2019.    Activities of Daily Living In your present state of health, do you have any difficulty performing the following activities: 12/26/2019  Hearing? Y  Comment lost a hearing aide  Vision? N  Difficulty concentrating or making decisions? N  Walking or climbing stairs? N  Dressing or  bathing? N  Doing errands, shopping? N  Preparing Food and eating ? N  Using the Toilet? N  In the past six months, have you accidently leaked urine? N  Do you have problems with loss of bowel control? N  Managing your Medications? N  Managing your Finances? N  Housekeeping or managing your Housekeeping? N  Some recent data might be hidden    Patient Care Team: Glendale Chard, MD as PCP - General (Internal Medicine)    Assessment:   This is a routine wellness examination for Alyana.  Exercise Activities and Dietary recommendations Current Exercise Habits: Home exercise routine, Type of exercise: walking, Time (Minutes): 45, Frequency (Times/Week): 2, Weekly Exercise (Minutes/Week): 90  Goals    . Patient Stated     12/26/2019, wants to start swimming       Fall Risk Fall Risk  12/26/2019 06/21/2019 12/14/2018  Falls in the past year? 0 1 0  Number falls in past yr: - 1 -  Injury with Fall? - 0 -  Risk for fall due to : Medication side effect - Medication side effect  Follow up Falls evaluation completed;Education provided;Falls prevention discussed - Falls prevention discussed   Is the patient's home free of loose throw rugs  in walkways, pet beds, electrical cords, etc?   yes      Grab bars in the bathroom? yes      Handrails on the stairs?   yes      Adequate lighting?   yes  Timed Get Up and Go performed: n/a  Depression Screen PHQ 2/9 Scores 12/26/2019 06/21/2019 12/14/2018  PHQ - 2 Score 0 0 3  PHQ- 9 Score 0 - 4     Cognitive Function     6CIT Screen 12/26/2019  What Year? 0 points  What month? 0 points  What time? 0 points  Count back from 20 0 points  Months in reverse 0 points  Repeat phrase 0 points  Total Score 0    Immunization History  Administered Date(s) Administered  . Hep A / Hep B 08/23/2017  . Influenza, High Dose Seasonal PF 06/21/2019  . Influenza-Unspecified 07/30/2018  . Zoster Recombinat (Shingrix) 07/22/2017    Qualifies for Shingles Vaccine? yes  Screening Tests Health Maintenance  Topic Date Due  . PNA vac Low Risk Adult (1 of 2 - PCV13) Never done  . TETANUS/TDAP  05/26/2023  . COLONOSCOPY  12/14/2027  . INFLUENZA VACCINE  Completed  . DEXA SCAN  Completed  . Hepatitis C Screening  Completed    Cancer Screenings: Lung: Low Dose CT Chest recommended if Age 40-80 years, 30 pack-year currently smoking OR have quit w/in 15years. Patient does not qualify. Breast:  Up to date on Mammogram? Yes   Up to date of Bone Density/Dexa? Yes Colorectal: up to date  Additional Screenings: : Hepatitis C Screening: 03/19/2013     Plan:    Patient wants to start swimming.   I have personally reviewed and noted the following in the patient's chart:   . Medical and social history . Use of alcohol, tobacco or illicit drugs  . Current medications and supplements . Functional ability and status . Nutritional status . Physical activity . Advanced directives . List of other physicians . Hospitalizations, surgeries, and ER visits in previous 12 months . Vitals . Screenings to include cognitive, depression, and falls . Referrals and appointments  In addition, I have  reviewed and discussed with patient certain preventive protocols, quality metrics,  and best practice recommendations. A written personalized care plan for preventive services as well as general preventive health recommendations were provided to patient.     Kellie Simmering, LPN  [phone]

## 2019-12-26 NOTE — Patient Instructions (Signed)
Bursitis  Bursitis is inflammation and irritation of a bursa, which is one of the small, fluid-filled sacs that cushion and protect the moving parts of your body. These sacs are located between bones and muscles, bones and muscle attachments, or bones and skin areas that are next to bones. A bursa protects those structures from the wear and tear that results from frequent movement. An inflamed bursa causes pain and swelling. Fluid may build up inside the sac. Bursitis is most common near joints, especially the knees, elbows, hips, and shoulders. What are the causes? This condition may be caused by:  Injury from: ? A direct hit (blow), like falling on your knee or elbow. ? Overuse of a joint (repetitive stress).  Infection. This can happen if bacteria get into a bursa through a cut or scrape near a joint.  Diseases that cause joint inflammation, such as gout and rheumatoid arthritis. What increases the risk? You are more likely to develop this condition if you:  Have a job or hobby that involves a lot of repetitive stress on your joints.  Have a condition that weakens your body's defense system (immune system), such as diabetes, cancer, or HIV.  Do any of these often: ? Lift and reach overhead. ? Kneel or lean on hard surfaces. ? Run or walk. What are the signs or symptoms? The most common symptoms of this condition include:  Pain that gets worse when you move the affected body part or use it to support (bear) your body weight.  Inflammation.  Stiffness. Other symptoms include:  Redness.  Swelling.  Tenderness.  Warmth.  Pain that continues after rest.  Fever or chills. These may occur in bursitis that is caused by infection. How is this diagnosed? This condition may be diagnosed based on:  Your medical history and a physical exam.  Imaging tests, such as an MRI.  A procedure to drain fluid from the bursa with a needle (aspiration). The fluid may be checked for  signs of infection or gout.  Blood tests to rule out other causes of inflammation. How is this treated? This condition can usually be treated at home with rest, ice, applying pressure (compression), and raising the body part that is affected (elevation). This is called RICE therapy. For mild bursitis, RICE therapy may be all you need. Other treatments may include:  NSAIDs to treat pain and inflammation.  Corticosteroid medicines to fight inflammation. These medicines may be injected into and around the area of bursitis.  Aspiration of fluid from the bursa to relieve pain and improve movement.  Antibiotic medicine to treat an infected bursa.  A splint, brace, or walking aid, such as a cane.  Physical therapy if you continue to have pain or limited movement.  Surgery to remove a damaged or infected bursa. This may be needed if other treatments have not worked. Follow these instructions at home: Medicines  Take over-the-counter and prescription medicines only as told by your health care provider.  If you were prescribed an antibiotic medicine, take it as told by your health care provider. Do not stop taking the antibiotic even if you start to feel better. General instructions   Rest the affected area as told by your health care provider. ? If possible, raise (elevate) the affected area above the level of your heart while you are sitting or lying down. ? Avoid activities that make pain worse.  Use splints, braces, pads, or walking aids as told by your health care provider.  If   directed, put ice on the affected area: ? If you have a removable splint or brace, remove it as told by your health care provider. ? Put ice in a plastic bag. ? Place a towel between your skin and the bag or between your splint or brace and the bag. ? Leave the ice on for 20 minutes, 2-3 times a day.  Keep all follow-up visits as told by your health care provider. This is important. Preventing future  episodes Take actions to help prevent future episodes of bursitis.  Wear knee pads if you kneel often.  Wear sturdy running or walking shoes that fit you well.  Take breaks regularly from repetitive activity.  Warm up by stretching before doing any activity that takes a lot of effort.  Maintain a healthy weight or lose weight as recommended by your health care provider. If you need help doing this, ask your health care provider.  Exercise regularly. Start any new physical activity gradually. Contact a health care provider if you:  Have a fever.  Have chills.  Have bursitis that is not getting better with treatment or home care. Summary  Bursitis is inflammation and irritation of a bursa, which is one of the small, fluid-filled sacs that cushion and protect the moving parts of your body.  An inflamed bursa causes pain and swelling.  Bursitis is commonly diagnosed with a physical exam, but other tests are sometimes needed.  This condition can usually be treated at home with rest, ice, applying pressure (compression), and raising the body part that is affected (elevation). This is called RICE therapy. This information is not intended to replace advice given to you by your health care provider. Make sure you discuss any questions you have with your health care provider. Document Revised: 09/02/2017 Document Reviewed: 08/05/2017 Elsevier Patient Education  2020 [employer]  

## 2019-12-26 NOTE — Progress Notes (Signed)
This visit occurred during the SARS-CoV-2 public health emergency.  Safety protocols were in place, including screening questions prior to the visit, additional usage of staff PPE, and extensive cleaning of exam room while observing appropriate contact time as indicated for disinfecting solutions.  Subjective:     Patient ID: Mary Krueger , female    DOB: [birth year] , 74 y.o.   MRN: [id number]   Chief Complaint  Patient presents with  . Hypertension    HPI  She reports today for BP check. She reports compliance with metoprolol. She denies headaches, chest pain and palpitations.   Hypertension This is a chronic problem. The current episode started more than 1 year ago. The problem has been gradually improving since onset. The problem is controlled. Associated symptoms include anxiety. Pertinent negatives include no blurred vision, chest pain, palpitations or shortness of breath. Past treatments include beta blockers. The current treatment provides moderate improvement.     Past Medical History:  Diagnosis Date  . Anxiety    Chronic  . Breast mass, left 2005   fatty on U/S  . Cataract   . Fibroid 1987   TAH/USO  . Glaucoma   . Hyperlipidemia   . Uses hearing aid    Bilateral   . Vitamin D deficiency      Family History  Problem Relation Age of Onset  . Diabetes Mother   . Prostate cancer Father   . Colon cancer Father   . Diabetes Brother   . Prostate cancer Brother   . Colon polyps Neg Hx   . Esophageal cancer Neg Hx   . Stomach cancer Neg Hx   . Rectal cancer Neg Hx      Current Outpatient Medications:  .  aspirin EC 81 MG tablet, Take 81 mg by mouth daily., Disp: , Rfl:  .  cholecalciferol (VITAMIN D3) 25 MCG (1000 UT) tablet, Take 1,000 Units by mouth daily., Disp: , Rfl:  .  latanoprost (XALATAN) 0.005 % ophthalmic solution, , Disp: , Rfl:  .  meloxicam (MOBIC) 15 MG tablet, Take 15 mg by mouth as needed. , Disp: , Rfl:  .  metoprolol succinate  (TOPROL-XL) 25 MG 24 hr tablet, TAKE ONE TABLET BY MOUTH EVERY EVENING, Disp: 90 tablet, Rfl: 0 .  PARoxetine (PAXIL) 40 MG tablet, TAKE ONE TABLET BY MOUTH DAILY, Disp: 90 tablet, Rfl: 0   Allergies  Allergen Reactions  . Atorvastatin Other (See Comments)    myalgias  . Rosuvastatin Other (See Comments)    myalgias     Review of Systems  Constitutional: Negative.   Eyes: Negative for blurred vision.  Respiratory: Negative.  Negative for shortness of breath.   Cardiovascular: Negative.  Negative for chest pain and palpitations.  Gastrointestinal: Negative.   Musculoskeletal: Positive for arthralgias.       She c/o r shoulder pain. This is a new flare of a chronic issue. She denies fall/trauma. She admits she has been making a lot of renovations to her home. There is some pain with movement.   Neurological: Negative.   Psychiatric/Behavioral: Negative.      Today's Vitals   12/26/19 1023  BP: 132/70  Pulse: 76  Temp: 97.9 F (36.6 C)  SpO2: 97%  Weight: 142 lb 9.6 oz (64.7 kg)  Height: 5' 3.4" (1.61 m)  PainSc: 2   PainLoc: Shoulder   Body mass index is 24.94 kg/m.   Objective:  Physical Exam Vitals and nursing note reviewed.  Constitutional:  Appearance: Normal appearance.  HENT:     Head: Normocephalic and atraumatic.  Cardiovascular:     Rate and Rhythm: Normal rate and regular rhythm.     Heart sounds: Normal heart sounds.  Pulmonary:     Effort: Pulmonary effort is normal.     Breath sounds: Normal breath sounds.  Musculoskeletal:     Comments: Decreased ROM due to pain.  Tenderness. No overlying erythema.   Skin:    General: Skin is warm.  Neurological:     General: No focal deficit present.     Mental Status: She is alert.  Psychiatric:        Mood and Affect: Mood normal.        Behavior: Behavior normal.         Assessment And Plan:      1. Essential hypertension  Chronic, controlled.  She will continue with current meds. She is  encouraged to avoid adding salt to her foods. I will check renal function today. She will rto in six months for re-evaluation.   2. Mixed hyperlipidemia  Chronic. She is statin intolerant.  She has yet to apply for patient assistance for Repatha. She reports she will start this later today or tomorrow. Previous LDL results reviewed in full detail during her visit. Cardiology/pharmacy notes reviewed as well.   3. Generalized anxiety disorder  Chronic. She agrees that she is ready to decrease her dose. She will continue with current dose until she runs out. At that time, she will start with Paxil 35m daily. She is aware she can't stop medication abruptly.   4. Chronic right shoulder pain  She will continue with meloxicam prn. She is also encouraged to apply topical pain cream to affected area two to three times daily as needed. If persistent, she may benefit from physical therapy.   5. Drug therapy  - CMP14+EGFR  I personally spent 30 minutes face-to-face and non-face-to-face in the care of this patient, which includes all pre-, intra-, and post visit time on the date of service.   RMaximino Greenland MD    THE PATIENT IS ENCOURAGED TO PRACTICE SOCIAL DISTANCING DUE TO THE COVID-19 PANDEMIC.

## 2019-12-27 LAB — CMP14+EGFR
ALT: 13 IU/L (ref 0–32)
AST: 21 IU/L (ref 0–40)
Albumin/Globulin Ratio: 2 (ref 1.2–2.2)
Albumin: 4.7 g/dL (ref 3.7–4.7)
Alkaline Phosphatase: 69 IU/L (ref 39–117)
BUN/Creatinine Ratio: 20 (ref 12–28)
BUN: 17 mg/dL (ref 8–27)
Bilirubin Total: 0.2 mg/dL (ref 0.0–1.2)
CO2: 26 mmol/L (ref 20–29)
Calcium: 9.6 mg/dL (ref 8.7–10.3)
Chloride: 102 mmol/L (ref 96–106)
Creatinine, Ser: 0.87 mg/dL (ref 0.57–1.00)
GFR calc Af Amer: 75 mL/min/{1.73_m2} (ref >59)
GFR calc non Af Amer: 65 mL/min/{1.73_m2} (ref >59)
Globulin, Total: 2.4 g/dL (ref 1.5–4.5)
Glucose: 90 mg/dL (ref 65–99)
Potassium: 4.4 mmol/L (ref 3.5–5.2)
Sodium: 142 mmol/L (ref 134–144)
Total Protein: 7.1 g/dL (ref 6.0–8.5)

## 2020-01-15 ENCOUNTER — Other Ambulatory Visit: Payer: Self-pay | Admitting: Pharmacist Clinician (PhC)/ Clinical Pharmacy Specialist

## 2020-01-15 MED ORDER — PRALUENT 150 MG/ML ~~LOC~~ SOAJ: 150.0000 mg | SUBCUTANEOUS | 12 refills | Status: DC

## 2020-01-17 ENCOUNTER — Other Ambulatory Visit: Payer: Self-pay | Admitting: Internal Medicine

## 2020-01-23 DIAGNOSIS — I69351 Hemiplegia and hemiparesis following cerebral infarction affecting right dominant side: Secondary | ICD-10-CM | POA: Diagnosis not present

## 2020-01-23 DIAGNOSIS — R69 Illness, unspecified: Secondary | ICD-10-CM | POA: Diagnosis not present

## 2020-01-23 DIAGNOSIS — E119 Type 2 diabetes mellitus without complications: Secondary | ICD-10-CM | POA: Diagnosis not present

## 2020-02-08 ENCOUNTER — Ambulatory Visit: Payer: Medicare HMO | Admitting: Obstetrics & Gynecology

## 2020-02-13 ENCOUNTER — Other Ambulatory Visit: Payer: Self-pay | Admitting: Internal Medicine

## 2020-02-14 DIAGNOSIS — H401131 Primary open-angle glaucoma, bilateral, mild stage: Secondary | ICD-10-CM | POA: Diagnosis not present

## 2020-03-24 ENCOUNTER — Telehealth: Payer: Self-pay | Admitting: Internal Medicine

## 2020-03-24 NOTE — Chronic Care Management (AMB) (Signed)
  Chronic Care Management   Outreach Note  03/24/2020 Name: Lempi Edwin MRN: [id number] DOB: Jul 16, 1946  Mkenzie Karleigh Bunte is a 74 y.o. year old female who is a primary care patient of Glendale Chard, MD. I reached out to Tally Due by phone today in response to a referral sent by Ms. Charnese Lelan Pons Utsey's health plan.     An unsuccessful telephone outreach was attempted today. The patient was referred to the case management team for assistance with care management and care coordination.   Follow Up Plan: The care management team will reach out to the patient again over the next 7 days. If patient returns call to provider office, please advise to call [place] at [phone].  [place], [place] [address] Direct Dial: [phone] [email or web address] Website: [email or web address]

## 2020-04-02 NOTE — Chronic Care Management (AMB) (Signed)
  Chronic Care Management   Outreach Note  04/02/2020 Name: Soma Bachand MRN: [id number] DOB: 1946/01/05  Genora Eldean Klatt is a 74 y.o. year old female who is a primary care patient of Glendale Chard, MD. I reached out to Tally Due by phone today in response to a referral sent by Ms. Kynleigh Lelan Pons [place]'s health plan.     A second unsuccessful telephone outreach was attempted today. The patient was referred to the case management team for assistance with care management and care coordination.   Follow Up Plan: A HIPPA compliant phone message was left for the patient providing contact information and requesting a return call. The care management team will reach out to the patient again over the next 7 days. If patient returns call to provider office, please advise to call [place] at [phone].  [place], [place] [address] Direct Dial: [phone] [email or web address] Website: [email or web address]

## 2020-04-02 NOTE — Progress Notes (Signed)
Patient called into office ask for return call. Called patient back not able to leave message.

## 2020-04-03 NOTE — Chronic Care Management (AMB) (Signed)
  Chronic Care Management   Note  04/03/2020 Name: Mary Krueger MRN: [id number] DOB: 1946/07/22  Mary Krueger is a 74 y.o. year old female who is a primary care patient of Glendale Chard, MD. I reached out to Tally Due by phone today in response to a referral sent by Mary Krueger's health plan.     Mary Krueger was given information about Chronic Care Management services today including:  1. CCM service includes personalized support from designated clinical staff supervised by her physician, including individualized plan of care and coordination with other care providers 2. 24/7 contact phone numbers for assistance for urgent and routine care needs. 3. Service will only be billed when office clinical staff spend 20 minutes or more in a month to coordinate care. 4. Only one practitioner may furnish and bill the service in a calendar month. 5. The patient may stop CCM services at any time (effective at the end of the month) by phone call to the office staff. 6. The patient will be responsible for cost sharing (co-pay) of up to 20% of the service fee (after annual deductible is met).  Patient agreed to services and verbal consent obtained.   Follow up plan: Face to Face appointment with care management team member scheduled for: 04/29/2020  [facility]  [place], [place] [address] Direct Dial: [email or web address] Website: [email or web address]

## 2020-04-16 ENCOUNTER — Other Ambulatory Visit: Payer: Self-pay | Admitting: Internal Medicine

## 2020-04-17 ENCOUNTER — Telehealth: Payer: Self-pay

## 2020-04-17 NOTE — Telephone Encounter (Signed)
The pt said that she was returning a call to the office.  I looked in the pt's chart and didn't see who called the patient.  The pt was given the dates for her next upcoming appointments.

## 2020-04-29 ENCOUNTER — Other Ambulatory Visit: Payer: Self-pay

## 2020-04-29 ENCOUNTER — Ambulatory Visit: Payer: Medicare HMO

## 2020-04-29 ENCOUNTER — Other Ambulatory Visit: Payer: Self-pay | Admitting: Internal Medicine

## 2020-04-29 DIAGNOSIS — F411 Generalized anxiety disorder: Secondary | ICD-10-CM

## 2020-04-29 DIAGNOSIS — E7800 Pure hypercholesterolemia, unspecified: Secondary | ICD-10-CM

## 2020-04-29 DIAGNOSIS — I10 Essential (primary) hypertension: Secondary | ICD-10-CM

## 2020-04-29 MED ORDER — PAROXETINE HCL 20 MG PO TABS
20.0000 mg | ORAL_TABLET | Freq: Every day | ORAL | 2 refills | Status: DC
Start: 2020-04-29 — End: 2020-07-29

## 2020-04-29 NOTE — Chronic Care Management (AMB) (Signed)
Chronic Care Management Pharmacy  Name: Mary Krueger  MRN: [id number] DOB: 16-Jun-1946  Chief Complaint/ HPI  Mary Krueger,  74 y.o. , female presents for their Initial CCM visit with the clinical pharmacist In office.  PCP : Glendale Chard, MD  Their chronic conditions include: Hypertension, Hyperlipidemia and Anxiety  Office Visits: 12/26/19 AWV and OV: HTN check. BP controlled, continue current medications. Avoid adding salt to foods. Pt reports she has not applied for patient assistance for Repatha yet, says she will start today or tomorrow. Agrees she is ready to decrease dose of Paxil for generalized anxiety disorder. Will start Paxil 59m once daily once she finishes current dose. Understands she cannot stop abruptly. Continue meloxicam as needed for right shoulder pain. Apply topical pain cream 2-3 times daily as needed. Labs ordered (CMP14+EGFR).   Consult Visits: 02/14/20 Optometry OV w/ Dr. MSabra Heck Primary open-angle glaucoma, bilateral mild stage. Note unavailable.   12/11/19 Cardiology Pharmacist OV with Dr. ABrion Aliment CPP: Home BP reading were mostly WNL (8/10). Possibly has an element of white coat syndrome. Pt advised to continue to monitor several times each month. Will begin PA process for PCSK9 inhibitor. Reviewed dosing, side effects, mechanism of action, and expected LDL drop with patient. [place]. Repeat labs in 3 months.   11/13/19 Cardiology OV w/ Dr. ROval Linsey Presented for evaluation of hyperlipidemia. Pt has tried atorvastatin, rosuvastatin, and simvastatin, but was not able to tolerate any of these due to myalgias. Pt admits to a terrible diet. 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 Pt advised to work on limiting fried foods, fatty foods, and read meat. Also going to try to follow the DASH diet. Referred to pharmacists to consider PCSK9  inhibitors. Lipids were not controlled on Zetia. BP elevated today but has been generally controlled at other visit. Pt is going to get BP cuff and check twice daily at home. Will reduce salt and follow up with pharmacist in 1 month. Continue metoprolol for now, consider switching to carvedilol or nebivolol.   CCM Encounters:  Medications: Outpatient Encounter Medications as of 04/29/2020  Medication Sig  . Alirocumab (PRALUENT) 150 MG/ML SOAJ Inject 150 mg into the skin every 14 (fourteen) days.  .Marland Kitchenaspirin EC 81 MG tablet Take 81 mg by mouth daily.  . cholecalciferol (VITAMIN D3) 25 MCG (1000 UT) tablet Take 1,000 Units by mouth daily.  .Marland Kitchenlatanoprost (XALATAN) 0.005 % ophthalmic solution   . meloxicam (MOBIC) 15 MG tablet Take 15 mg by mouth as needed.   . metoprolol succinate (TOPROL-XL) 25 MG 24 hr tablet TAKE ONE TABLET BY MOUTH EVERY EVENING  . PARoxetine (PAXIL) 40 MG tablet TAKE ONE TABLET BY MOUTH DAILY   No facility-administered encounter medications on file as of 04/29/2020.    Current Diagnosis/Assessment:    Goals Addressed   None     Hyperlipidemia   LDL goal < 70  Lipid Panel     Component Value Date/Time   CHOL 301 (H) 06/21/2019 1201   TRIG 113 06/21/2019 1201   HDL 67 06/21/2019 1201   LDLCALC 214 (H) 06/21/2019 1201   LDLDIRECT 166.6 10/24/2008 0827    Hepatic Function Latest Ref Rng & Units 12/26/2019 06/21/2019 01/17/2019  Total Protein 6.0 - 8.5 g/dL 7.1 6.9 7.1  Albumin 3.7 - 4.7 g/dL 4.7 4.4 4.1  AST 0 - 40 IU/L 21 19 20   ALT 0 - 32  IU/L 13 12 18   Alk Phosphatase 39 - 117 IU/L 69 76 53  Total Bilirubin 0.0 - 1.2 mg/dL 0.2 0.3 0.8  Bilirubin, Direct 0.0 - 0.3 mg/dL - - -    The 10-year ASCVD risk score Mikey Bussing DC Jr., et al., 2013) is: 25.3%   Values used to calculate the score:     Age: 26 years     Sex: Female     Is Non-Hispanic African American: No     Diabetic: No     Tobacco smoker: No     Systolic Blood Pressure: 096 mmHg     Is BP  treated: Yes     HDL Cholesterol: 67 mg/dL     Total Cholesterol: 301 mg/dL   Patient has failed these meds in past: Atorvastatin, pitavastatin, Zetia, rosuvastatin Patient is currently uncontrolled on the following medications:  . Praluent 141m every 14 days  We discussed:   . Pt was approved for a [employer]and is [facility]. She started using Praluent about 3 months ago . Advised pt to contact my if there is ever any issue with the HLucent Technologiesof cost of Praluent and I will assist . She denies any problems with Praluent and says she can hardly feel the shot Diet extensively Pt reports that she has a "good" diet Eats JDarlyn Chamberbreak with peanut butter and 1/2 a banana for breakfast, coffee Protein bar for lunch, no sugar [employer] organic frozen burrito for dinner with rice and vegetables States she loves salads Pt does not drink/eat milk products Pt does not cook   Exercise extensively Walks 2 miles (45 minutes) 2-3 times a week Pt also does home renovations frequently and mentions that this is good exercise Recommend pt get 30 minutes of moderate intensity exercise daily 5 times a week (150 minutes total per week)  Plan Continue current medications  Follow up with Cardiology for lipid panel at next visit   Hypertension   BP goal is:  <130/80  Office blood pressures are  BP Readings from Last 3 Encounters:  12/26/19 132/70  12/26/19 132/70  12/11/19 136/74   Patient checks BP at home infrequently Patient home BP readings are ranging: None to provide  Patient has failed these meds in the past: N/A Patient is currently controlled on the following medications:  .Marland KitchenMetoprolol succinate 271mevery evening  We discussed: . Pt was checking blood pressure a couple times a month but stopped o Advised pt to check BP once a week and record . She states that she does not want to be put on anymore medications . She asked if she  still needed to take metoprolol o Advised pt that she needs to continue metoprolol because it is helping to control her blood pressure o Educated pt that if blood pressure is normal on medication that it is due to the medication and would most likely go up if medication was stopped Diet and exercise extensively o Pt has stopped adding salt to foods  Advised pt to look at the salt content on frozen foods and try to minimize how much salt she is eating  Recommend pt choose frozen foods without salt (or with minimal salt) such a frozen steamable vegetables.   Plan Continue current medications   Anxiety   Patient has failed these meds in past: N/A Patient is currently controlled on the following medications:   Paroxetine 4029maily  We discussed:  Per PCP note from 3/24, pt was to be decreased to paroxetine 72m daily after completing her current refill of paroxetine 474m Pt states that does not know if the paroxetine is doing anything anymore  She is not experiencing symptoms of depression or anxiety currently and has not this year  Advised pt that she cannot stop paroxetine abruptly (she expressed she was aware of this)  Will collaborate with PCP to send in RX for paroxetine 2051mWe is open to therapy and will talk to a therapist if she feels like she needs to  Work was a significant source of stress and anxiety for her in the past, but she is no longer working  Plan Continue current medications  Collaborate with PCP to send in prescription for paroxetine 35m45mily   Glaucoma   Patient has failed these meds in past: Timolol Patient is currently controlled on the following medications:   Latanoprost 0.005% daily  We discussed:   Pt uses latanoprost for mild glaucoma  Plan Continue current medications  Osteopenia    Last DEXA Scan: 10/23/18  T-Score femoral neck: -0.6, -0.7  T-Score total hip:   T-Score lumbar spine: -1.2  T-Score forearm radius:   10-year  probability of major osteoporotic fracture: 8.1%  10-year probability of hip fracture: 1.1%  No results found for: VD25OH   Patient is not a candidate for pharmacologic treatment  Patient has failed these meds in past: N/A Patient is currently controlled on the following medications:  . Cholecalciferol 5000 units daily  We discussed:  Recommend 1200 mg of calcium daily from dietary and supplemental sources. Recommend weight-bearing and muscle strengthening exercises for building and maintaining bone density.  Plan Continue current medications  Recommend Vitamin D level at next PCP appointment  Health Maintenance   Patient is currently on the following medications:  . MeMarland Kitchenoxicam 15mg49mly . Aspirin 81mg 8my  We discussed:   . Aspirin daily prescribed by cardiologist who instructed pt to never miss a day, even if taking meloxicam . Pt takes 1 tablet of meloxicam about every 4 days as needed for pain . She is aware of the negative effects of meloxicam on kidneys which is why she does not use it daily  Plan Continue current medications   Vaccines   Reviewed and discussed patient's vaccination history.    Immunization History  Administered Date(s) Administered  . Hep A / Hep B 08/23/2017  . Influenza, High Dose Seasonal PF 06/21/2019  . Influenza-Unspecified 07/30/2018  . Zoster Recombinat (Shingrix) 07/22/2017   Tdap: 06/21/17 Shingrix: 07/22/17  Plan Discuss second dose of Shingrix at follow up Discuss pneumonia vaccines at follow up  Medication Management   [facility] Uses pill box? No - Patient does not want to use a pill box Pt endorses 100% compliance  We discussed:  . Importance of taking each medications daily as directed . Pt states that she never misses a dose of medications . Availability of medication synchronization, adherence packaging, and delivery available with [facility]  Plan Continue current  medication management strategy  Follow up: 3 month phone visit  CourtnJannette FogomD Clinical Pharmacist [facility] [phone]

## 2020-04-29 NOTE — Patient Instructions (Addendum)
Visit Information  Goals Addressed            This Visit's Progress   . Pharmacy Care Plan       CARE PLAN ENTRY (see longitudinal plan of care for additional care plan information)  Current Barriers:  . Chronic Disease Management support, education, and care coordination needs related to Hypertension, Hyperlipidemia, and Anxiety   Hypertension BP Readings from Last 3 Encounters:  12/26/19 132/70  12/26/19 132/70  12/11/19 136/74   . Pharmacist Clinical Goal(s): o Over the next 90 days, patient will work with PharmD and providers to achieve BP goal <130/80 . Current regimen:  o Metoprolol succinate 25mg  daily every evening . Interventions: o Provided dietary and exercise recommendations o Provided patient education about the importance of continuing blood pressure medication . Patient self care activities - Over the next 90 days, patient will: o Check BP once weekly, document, and provide at future appointments o Ensure daily salt intake < 2300 mg/day o Choose frozen foods that are low in salt content o Try to exercise for 30 minutes a day 5 times per week o Continue metoprolol daily as directed  Hyperlipidemia Lab Results  Component Value Date/Time   LDLCALC 214 (H) 06/21/2019 12:01 PM   LDLDIRECT 166.6 10/24/2008 08:27 AM   . Pharmacist Clinical Goal(s): o Over the next 90 days, patient will work with PharmD and providers to achieve LDL goal < 70 . Current regimen:  o Praluent 150mg  every 14 days . Interventions: o Provided dietary and exercise recommendations o Discussed patient is currently receiving assistance through a grant and Praluent copay is $0. Advised patient there may be alternative options if [employer] grant runs out . Patient self care activities - Over the next 90 days, patient will: o Follow up with Cardiology (appointment scheduled 9/16)  Anxiety . Pharmacist Clinical Goal(s) o Over the next 180 days, patient will work with PharmD and providers  to manage symptoms of anxiety . Current regimen:  o Paroxetine 40mg  daily  . Interventions: o Reviewed most recent office visit notes from PCP and it was discussed decreasing patient to paroxetine 20mg  daily after she finished her current fill of paroxetine 40mg  o Collaborate with PCP to have a prescription or paroxetine 20mg  daily sent in . Patient self care activities - Over the next 180 days, patient will: o Decreased to paroxetine 20mg  daily after finishing current fill of paroxetine 40mg  (about 20 tablets remain)  Osteopenia  . Pharmacist Clinical Goal(s) o Over the next 180 days, patient will work with PharmD and providers to strengthen bones and reduce the risk of fractures . Current regimen:  o Cholecalciferol 5000 units daily . Interventions: o Recommend patient start calcium supplement 1200mg  daily o Recommend patient start weight-bearing exercises 3 days per week . Patient self care activities - Over the next 180 days, patient will: o Start taking 1200mg  of calcium daily o Start performing weight bearing exercises 3 times weekly  Medication management . Pharmacist Clinical Goal(s): o Over the next 180 days, patient will work with PharmD and providers to maintain optimal medication adherence . Current pharmacy: [facility] . Interventions o Comprehensive medication review performed. o Continue current medication management strategy . Patient self care activities - Over the next 180 days, patient will: o Take medications as prescribed o Report any questions or concerns to PharmD and/or provider(s)  Initial goal documentation        Mary Krueger was given information about Chronic Care Management services  today including:  1. CCM service includes personalized support from designated clinical staff supervised by her physician, including individualized plan of care and coordination with other care providers 2. 24/7 contact phone numbers for assistance for urgent  and routine care needs. 3. Standard insurance, coinsurance, copays and deductibles apply for chronic care management only during months in which we provide at least 20 minutes of these services. Most insurances cover these services at 100%, however patients may be responsible for any copay, coinsurance and/or deductible if applicable. This service may help you avoid the need for more expensive face-to-face services. 4. Only one practitioner may furnish and bill the service in a calendar month. 5. The patient may stop CCM services at any time (effective at the end of the month) by phone call to the office staff.  Patient agreed to services and verbal consent obtained.   The patient verbalized understanding of instructions provided today and agreed to receive a mailed copy of patient instruction and/or educational materials. Telephone follow up appointment with pharmacy team member scheduled for: 08/05/20 @ 3:30 PM  Jannette Fogo, PharmD Clinical Pharmacist [facility] [phone]  High Cholesterol  High cholesterol is a condition in which the blood has high levels of a white, waxy, fat-like substance (cholesterol). The human body needs small amounts of cholesterol. The liver makes all the cholesterol that the body needs. Extra (excess) cholesterol comes from the food that we eat. Cholesterol is carried from the liver by the blood through the blood vessels. If you have high cholesterol, deposits (plaques) may build up on the walls of your blood vessels (arteries). Plaques make the arteries narrower and stiffer. Cholesterol plaques increase your risk for heart attack and stroke. Work with your health care provider to keep your cholesterol levels in a healthy range. What increases the risk? This condition is more likely to develop in people who:  Eat foods that are high in animal fat (saturated fat) or cholesterol.  Are overweight.  Are not getting enough  exercise.  Have a family history of high cholesterol. What are the signs or symptoms? There are no symptoms of this condition. How is this diagnosed? This condition may be diagnosed from the results of a blood test.  If you are older than age 73, your health care provider may check your cholesterol every 4-6 years.  You may be checked more often if you already have high cholesterol or other risk factors for heart disease. The blood test for cholesterol measures:  "Bad" cholesterol (LDL cholesterol). This is the main type of cholesterol that causes heart disease. The desired level for LDL is less than 100.  "Good" cholesterol (HDL cholesterol). This type helps to protect against heart disease by cleaning the arteries and carrying the LDL away. The desired level for HDL is 60 or higher.  Triglycerides. These are fats that the body can store or burn for energy. The desired number for triglycerides is lower than 150.  Total cholesterol. This is a measure of the total amount of cholesterol in your blood, including LDL cholesterol, HDL cholesterol, and triglycerides. A healthy number is less than 200. How is this treated? This condition is treated with diet changes, lifestyle changes, and medicines. Diet changes  This may include eating more whole grains, fruits, vegetables, nuts, and fish.  This may also include cutting back on red meat and foods that have a lot of added sugar. Lifestyle changes  Changes may include getting at least 40 minutes of aerobic  exercise 3 times a week. Aerobic exercises include walking, biking, and swimming. Aerobic exercise along with a healthy diet can help you maintain a healthy weight.  Changes may also include quitting smoking. Medicines  Medicines are usually given if diet and lifestyle changes have failed to reduce your cholesterol to healthy levels.  Your health care provider may prescribe a statin medicine. Statin medicines have been shown to reduce  cholesterol, which can reduce the risk of heart disease. Follow these instructions at home: Eating and drinking If told by your health care provider:  Eat chicken (without skin), fish, veal, shellfish, ground [place] breast, and round or loin cuts of red meat.  Do not eat fried foods or fatty meats, such as hot dogs and salami.  Eat plenty of fruits, such as apples.  Eat plenty of vegetables, such as broccoli, potatoes, and carrots.  Eat beans, peas, and lentils.  Eat grains such as barley, rice, couscous, and bulgur wheat.  Eat pasta without cream sauces.  Use skim or nonfat milk, and eat low-fat or nonfat yogurt and cheeses.  Do not eat or drink whole milk, cream, ice cream, egg yolks, or hard cheeses.  Do not eat stick margarine or tub margarines that contain trans fats (also called partially hydrogenated oils).  Do not eat saturated tropical oils, such as coconut oil and palm oil.  Do not eat cakes, cookies, crackers, or other baked goods that contain trans fats.  General instructions  Exercise as directed by your health care provider. Increase your activity level with activities such as gardening, walking, and taking the stairs.  Take over-the-counter and prescription medicines only as told by your health care provider.  Do not use any products that contain nicotine or tobacco, such as cigarettes and e-cigarettes. If you need help quitting, ask your health care provider.  Keep all follow-up visits as told by your health care provider. This is important. Contact a health care provider if:  You are struggling to maintain a healthy diet or weight.  You need help to start on an exercise program.  You need help to stop smoking. Get help right away if:  You have chest pain.  You have trouble breathing. This information is not intended to replace advice given to you by your health care provider. Make sure you discuss any questions you have with your health care  provider. Document Revised: 09/23/2017 Document Reviewed: 03/20/2016 Elsevier Patient Education  [place].

## 2020-05-19 ENCOUNTER — Other Ambulatory Visit: Payer: Self-pay | Admitting: Internal Medicine

## 2020-06-19 ENCOUNTER — Encounter: Payer: Self-pay | Admitting: Cardiovascular Disease

## 2020-06-19 ENCOUNTER — Other Ambulatory Visit: Payer: Self-pay

## 2020-06-19 ENCOUNTER — Ambulatory Visit: Payer: Medicare HMO | Admitting: Cardiovascular Disease

## 2020-06-19 VITALS — BP 110/64 | HR 68 | Ht 64.0 in | Wt 145.8 lb

## 2020-06-19 DIAGNOSIS — E7800 Pure hypercholesterolemia, unspecified: Secondary | ICD-10-CM | POA: Diagnosis not present

## 2020-06-19 DIAGNOSIS — I10 Essential (primary) hypertension: Secondary | ICD-10-CM

## 2020-06-19 LAB — COMPREHENSIVE METABOLIC PANEL
ALT: 14 IU/L (ref 0–32)
AST: 15 IU/L (ref 0–40)
Albumin/Globulin Ratio: 1.7 (ref 1.2–2.2)
Albumin: 4.1 g/dL (ref 3.7–4.7)
Alkaline Phosphatase: 73 IU/L (ref 44–121)
BUN/Creatinine Ratio: 21 (ref 12–28)
BUN: 18 mg/dL (ref 8–27)
Bilirubin Total: 0.3 mg/dL (ref 0.0–1.2)
CO2: 28 mmol/L (ref 20–29)
Calcium: 9.1 mg/dL (ref 8.7–10.3)
Chloride: 105 mmol/L (ref 96–106)
Creatinine, Ser: 0.87 mg/dL (ref 0.57–1.00)
GFR calc Af Amer: 75 mL/min/{1.73_m2} (ref >59)
GFR calc non Af Amer: 65 mL/min/{1.73_m2} (ref >59)
Globulin, Total: 2.4 g/dL (ref 1.5–4.5)
Glucose: 91 mg/dL (ref 65–99)
Potassium: 4.6 mmol/L (ref 3.5–5.2)
Sodium: 142 mmol/L (ref 134–144)
Total Protein: 6.5 g/dL (ref 6.0–8.5)

## 2020-06-19 LAB — LIPID PANEL
Chol/HDL Ratio: 3 ratio (ref 0.0–4.4)
Cholesterol, Total: 186 mg/dL (ref 100–199)
HDL: 63 mg/dL (ref >39)
LDL Chol Calc (NIH): 104 mg/dL — ABNORMAL HIGH (ref 0–99)
Triglycerides: 106 mg/dL (ref 0–149)
VLDL Cholesterol Cal: 19 mg/dL (ref 5–40)

## 2020-06-19 NOTE — Progress Notes (Signed)
Cardiology Office Note   Date:  06/19/2020   ID:  Mary Krueger, DOB [birth year], MRN [id number]  PCP:  Glendale Chard, MD  Cardiologist:   Skeet Latch, MD   No chief complaint on file.    History of Present Illness: Mary Krueger is a 74 y.o. 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  Mary Krueger reports that she has had hyperlipidemia for many years.  She previously tried atorvastatin, rosuvastatin, and simvastatin but did not tolerate them due to myalgias.  It was noted that her diet was poor and she wanted to work on this prior to starting any other additional medications.  Given her level of hyperlipidemia is not likely that she has familial hyperlipidemia and recommended she start a PCSK9 inhibitor.  She has been taking Praluent two months and is tolerating it well.  She walks three days per week and goes on a hike once per week.  She has no exertional chest pain and her breathing is stable.  She denies any lower extremity edema, orthopnea, or PND.  She has been working on her diet and was trying to limit her sodium intake.  Lately she has had a little more than usual.  She has not been checking her blood pressure at home.  Mary Krueger was treated for a food impaction 10/2019 and required esophageal dilation.  She previously saw Dr. Percival Spanish 10/2016 for palpitations.  No further testing was needed at that time.  She denies any family history of heart attacks, stroke, or sudden cardiac death.   Past Medical History:  Diagnosis Date  . Anxiety    Chronic  . Breast mass, left 2005   fatty on U/S  . Cataract   . Fibroid 1987   TAH/USO  . Glaucoma   . Hyperlipidemia   . Uses hearing aid    Bilateral   . Vitamin D deficiency     Past Surgical History:  Procedure Laterality Date  . BALLOON DILATION N/A 03/05/2019   Procedure: BALLOON DILATION;  Surgeon: Mauri Pole, MD;  Location: [facility];  Service: Endoscopy;  Laterality: N/A;  TTS balloon dilation  . CATARACT EXTRACTION W/ INTRAOCULAR LENS IMPLANT Bilateral 12/2015 01/2016  . COSMETIC SURGERY     Mini face lift  . ESOPHAGEAL MANOMETRY N/A 03/05/2019   Procedure: ESOPHAGEAL MANOMETRY (EM);  Surgeon: Mauri Pole, MD;  Location: [facility];  Service: Endoscopy;  Laterality: N/A;  . ESOPHAGOGASTRODUODENOSCOPY  01/2019  . ESOPHAGOGASTRODUODENOSCOPY (EGD) WITH PROPOFOL N/A 01/17/2019   Procedure: ESOPHAGOGASTRODUODENOSCOPY (EGD) WITH PROPOFOL;  Surgeon: Jerene Bears, MD;  Location: [place];  Service: Gastroenterology;  Laterality: N/A;  . ESOPHAGOGASTRODUODENOSCOPY (EGD) WITH PROPOFOL N/A 03/05/2019   Procedure: ESOPHAGOGASTRODUODENOSCOPY (EGD) WITH PROPOFOL;  Surgeon: Mauri Pole, MD;  Location: [facility];  Service: Endoscopy;  Laterality: N/A;  . EYE SURGERY  06/2012   eye lift  . FOREIGN BODY REMOVAL  01/17/2019   Procedure: FOREIGN BODY REMOVAL;  Surgeon: Jerene Bears, MD;  Location: [facility]COPY;  Service: Gastroenterology;;  . LIPOMA EXCISION Left 2009   left arm  . STOMACH SURGERY  1997   Sphincter  . TONSILLECTOMY AND ADENOIDECTOMY  1972  . TOTAL ABDOMINAL HYSTERECTOMY W/ BILATERAL SALPINGOOPHORECTOMY    . TRIGGER FINGER RELEASE     right     Current Outpatient Medications  Medication Sig Dispense Refill  . Alirocumab (PRALUENT) 150 MG/ML  SOAJ Inject 150 mg into the skin every 14 (fourteen) days. 2 mL 12  . aspirin EC 81 MG tablet Take 81 mg by mouth daily.    . Cholecalciferol (VITAMIN D) 125 MCG (5000 UT) CAPS Take 5,000 Units by mouth daily.     Marland Kitchen latanoprost (XALATAN) 0.005 % ophthalmic solution     . meloxicam (MOBIC) 15 MG tablet Take 15 mg by mouth as needed.     . metoprolol succinate (TOPROL-XL) 25 MG 24 hr tablet TAKE ONE TABLET BY MOUTH EVERY EVENING 90 tablet 0  . PARoxetine (PAXIL) 20 MG tablet Take 1 tablet (20 mg total) by mouth daily. 90 tablet 2     No current facility-administered medications for this visit.    Allergies:   Atorvastatin and Rosuvastatin    Social History:  The patient  reports that she has quit smoking. She has never used smokeless tobacco. She reports that she does not drink alcohol and does not use drugs.   Family History:  The patient's family history includes Colon cancer in her father; Diabetes in her brother and mother; Prostate cancer in her brother and father.    ROS:  Please see the history of present illness.   Otherwise, review of systems are positive for none.   All other systems are reviewed and negative.    PHYSICAL EXAM: VS:  BP 110/64   Pulse 68   Ht 5\' 4"  (1.626 m)   Wt 145 lb 12.8 oz (66.1 kg)   LMP 10/04/1984   SpO2 99%   BMI 25.03 kg/m  , BMI Body mass index is 25.03 kg/m. GENERAL:  Well appearing HEENT: Pupils equal round and reactive, fundi not visualized, oral mucosa unremarkable NECK:  No jugular venous distention, waveform within normal limits, carotid upstroke brisk and symmetric, no bruits LUNGS:  Clear to auscultation bilaterally HEART:  RRR.  PMI not displaced or sustained,S1 and S2 within normal limits, no S3, no S4, no clicks, no rubs, no murmurs ABD:  Flat, positive bowel sounds normal in frequency in pitch, no bruits, no rebound, no guarding, no midline pulsatile mass, no hepatomegaly, no splenomegaly EXT:  2 plus pulses throughout, no edema, no cyanosis no clubbing SKIN:  No rashes no nodules NEURO:  Cranial nerves II through XII grossly intact, motor grossly intact throughout PSYCH:  Cognitively intact, oriented to person place and time  EKG:  EKG is ordered today. 06/19/2020: Sinus rhythm.  Rate 68 bpm.  Cannot rule out prior septal infarct.   Recent Labs: 06/21/2019: Hemoglobin 14.6; Platelets 353; TSH 3.310 12/26/2019: ALT 13; BUN 17; Creatinine, Ser 0.87; Potassium 4.4; Sodium 142    Lipid Panel    Component Value Date/Time   CHOL 301 (H) 06/21/2019 1201    TRIG 113 06/21/2019 1201   HDL 67 06/21/2019 1201   CHOLHDL 4.5 (H) 06/21/2019 1201   CHOLHDL 4.4 CALC 10/24/2008 0827   VLDL 20 10/24/2008 0827   LDLCALC 214 (H) 06/21/2019 1201   LDLDIRECT 166.6 10/24/2008 0827      Wt Readings from Last 3 Encounters:  06/19/20 145 lb 12.8 oz (66.1 kg)  12/26/19 142 lb 9.6 oz (64.7 kg)  12/26/19 142 lb 9.6 oz (64.7 kg)      ASSESSMENT AND PLAN:  # Hyperlipidemia:   History of statin intolerance.  Zetia was not effective.  She has been on Praluent for two months and tolerating it well.  We will repeat lipids and a CMP today.  # Elevated BP:  Blood pressure is very well-controlled today.  Continue metoprolol.  Current medicines are reviewed at length with the patient today.  The patient does not have concerns regarding medicines.  The following changes have been made:  no change  Labs/ tests ordered today include:   Orders Placed This Encounter  Procedures  . Lipid panel  . Comprehensive metabolic panel  . EKG 12-Lead     Disposition:   FU with Marlette Curvin C. Oval Linsey, MD, [facility] in 1 year    Signed, Jillisa Harris C. Oval Linsey, MD, [facility]  06/19/2020 9:07 AM    [place]

## 2020-06-19 NOTE — Patient Instructions (Signed)
Medication Instructions:  Your physician recommends that you continue on your current medications as directed. Please refer to the Current Medication list given to you today.  *If you need a refill on your cardiac medications before your next appointment, please call your pharmacy*  Lab Work: LP/CMET TODAY   If you have labs (blood work) drawn today and your tests are completely normal, you will receive your results only by: Marland Kitchen MyChart Message (if you have MyChart) OR . A paper copy in the mail If you have any lab test that is abnormal or we need to change your treatment, we will call you to review the results.  Testing/Procedures: NONE  Follow-Up: At [facility], you and your health needs are our priority.  As part of our continuing mission to provide you with exceptional heart care, we have created designated Provider Care Teams.  These Care Teams include your primary Cardiologist (physician) and Advanced Practice Providers (APPs -  Physician Assistants and Nurse Practitioners) who all work together to provide you with the care you need, when you need it.  We recommend signing up for the patient portal called "MyChart".  Sign up information is provided on this After Visit Summary.  MyChart is used to connect with patients for Virtual Visits (Telemedicine).  Patients are able to view lab/test results, encounter notes, upcoming appointments, etc.  Non-urgent messages can be sent to your provider as well.   To learn more about what you can do with MyChart, go to [email or web address].    Your next appointment:   12 month(s)  You will receive a reminder letter in the mail two months in advance. If you don't receive a letter, please call our office to schedule the follow-up appointment.  The format for your next appointment:   In Person  Provider:   You may see DR [facility]  or one of the following Advanced Practice Providers on your designated Care Team:    Kerin Ransom, PA-C  [place], [place]  Coletta Memos, [place]

## 2020-07-01 ENCOUNTER — Encounter: Payer: Medicare HMO | Admitting: Internal Medicine

## 2020-07-08 ENCOUNTER — Ambulatory Visit (INDEPENDENT_AMBULATORY_CARE_PROVIDER_SITE_OTHER): Payer: Medicare HMO | Admitting: Internal Medicine

## 2020-07-08 ENCOUNTER — Telehealth: Payer: Self-pay

## 2020-07-08 ENCOUNTER — Encounter: Payer: Self-pay | Admitting: Internal Medicine

## 2020-07-08 ENCOUNTER — Other Ambulatory Visit: Payer: Self-pay

## 2020-07-08 VITALS — BP 114/72 | HR 85 | Temp 98.0°F | Ht 64.0 in

## 2020-07-08 DIAGNOSIS — M2550 Pain in unspecified joint: Secondary | ICD-10-CM

## 2020-07-08 DIAGNOSIS — Z20822 Contact with and (suspected) exposure to covid-19: Secondary | ICD-10-CM | POA: Diagnosis not present

## 2020-07-08 DIAGNOSIS — E7800 Pure hypercholesterolemia, unspecified: Secondary | ICD-10-CM | POA: Diagnosis not present

## 2020-07-08 NOTE — Patient Instructions (Signed)
COVID-19: Quarantine vs. Isolation QUARANTINE keeps someone who was in close contact with someone who has COVID-19 away from others. If you had close contact with a person who has COVID-19  Stay home until 14 days after your last contact.  Check your temperature twice a day and watch for symptoms of COVID-19.  If possible, stay away from people who are at higher-risk for getting very sick from COVID-19. ISOLATION keeps someone who is sick or tested positive for COVID-19 without symptoms away from others, even in their own home. If you are sick and think or know you have COVID-19  Stay home until after ? At least 10 days since symptoms first appeared and ? At least 24 hours with no fever without fever-reducing medication and ? Symptoms have improved If you tested positive for COVID-19 but do not have symptoms  Stay home until after ? 10 days have passed since your positive test If you live with others, stay in a specific "sick room" or area and away from other people or animals, including pets. Use a separate bathroom, if available. cdc.gov/coronavirus 04/23/2019 This information is not intended to replace advice given to you by your health care provider. Make sure you discuss any questions you have with your health care provider. Document Revised: 09/06/2019 Document Reviewed: 09/06/2019 Elsevier Patient Education  2020 [employer]  

## 2020-07-08 NOTE — Telephone Encounter (Signed)
I left the pt message to call the office back.  I was calling the pt to see if she could come in earlier for her appt today.

## 2020-07-08 NOTE — Progress Notes (Signed)
I,Katawbba Wiggins,acting as a Education administrator for Maximino Greenland, MD.,have documented all relevant documentation on the behalf of Maximino Greenland, MD,as directed by  Maximino Greenland, MD while in the presence of Maximino Greenland, MD. This visit occurred during the SARS-CoV-2 public health emergency.  Safety protocols were in place, including screening questions prior to the visit, additional usage of staff PPE, and extensive cleaning of exam room while observing appropriate contact time as indicated for disinfecting solutions.  Subjective:     Patient ID: Mary Krueger , female    DOB: Sep 13, 1946 , 74 y.o.   MRN: [id number]   Chief Complaint  Patient presents with  . Covid exposure    HPI  She presents today for a physical exam.  However, is willing to reschedule because she recently had close exposure to COVID. Last week, she went to beach with her friend. She was in close contact with her while at the beach, and they came home on Friday. While they were gone, her friend's husband was diagnosed with COVID. Her friend developed symptoms on Friday. Patient is fully vaccinated. Her friends are not. She is not currently experiencing any symptoms.     Past Medical History:  Diagnosis Date  . Anxiety    Chronic  . Breast mass, left 2005   fatty on U/S  . Cataract   . Fibroid 1987   TAH/USO  . Glaucoma   . Hyperlipidemia   . Uses hearing aid    Bilateral   . Vitamin D deficiency      Family History  Problem Relation Age of Onset  . Diabetes Mother   . Prostate cancer Father   . Colon cancer Father   . Diabetes Brother   . Prostate cancer Brother   . Colon polyps Neg Hx   . Esophageal cancer Neg Hx   . Stomach cancer Neg Hx   . Rectal cancer Neg Hx      Current Outpatient Medications:  .  Alirocumab (PRALUENT) 150 MG/ML SOAJ, Inject 150 mg into the skin every 14 (fourteen) days., Disp: 2 mL, Rfl: 12 .  aspirin EC 81 MG tablet, Take 81 mg by mouth daily., Disp: , Rfl:  .   Cholecalciferol (VITAMIN D) 125 MCG (5000 UT) CAPS, Take 5,000 Units by mouth daily. , Disp: , Rfl:  .  latanoprost (XALATAN) 0.005 % ophthalmic solution, , Disp: , Rfl:  .  meloxicam (MOBIC) 15 MG tablet, Take 15 mg by mouth as needed. , Disp: , Rfl:  .  metoprolol succinate (TOPROL-XL) 25 MG 24 hr tablet, TAKE ONE TABLET BY MOUTH EVERY EVENING, Disp: 90 tablet, Rfl: 0 .  PARoxetine (PAXIL) 20 MG tablet, Take 1 tablet (20 mg total) by mouth daily., Disp: 90 tablet, Rfl: 2   Allergies  Allergen Reactions  . Atorvastatin Other (See Comments)    myalgias  . Rosuvastatin Other (See Comments)    myalgias     Review of Systems  Constitutional: Negative.   Respiratory: Negative.   Cardiovascular: Negative.   Gastrointestinal: Negative.   Musculoskeletal: Positive for arthralgias.       She would like refill of meloxicam. She is taking for joint pains.   Neurological: Negative.   Psychiatric/Behavioral: Negative.      Today's Vitals   07/08/20 1207  BP: 114/72  Pulse: 85  Temp: 98 F (36.7 C)  TempSrc: Oral  SpO2: 97%  Height: 5\' 4"  (1.626 m)   Body mass index is 25.03 kg/m.  Objective:  Physical Exam Vitals and nursing note reviewed.  Constitutional:      Appearance: Normal appearance.  HENT:     Head: Normocephalic and atraumatic.  Cardiovascular:     Rate and Rhythm: Normal rate and regular rhythm.     Heart sounds: Normal heart sounds.  Pulmonary:     Effort: Pulmonary effort is normal.     Breath sounds: Normal breath sounds.  Skin:    General: Skin is warm.  Neurological:     General: No focal deficit present.     Mental Status: She is alert.  Psychiatric:        Mood and Affect: Mood normal.        Behavior: Behavior normal.         Assessment And Plan:     1. Close exposure to 2019 novel coronavirus Comments: COVID test performed today. She is encouraged to quarantine until these results are back. She is advised to let me know ASAP if she develops  symptoms. She is encouraged to stay well hydrated and to follow clean eating regimen, free of sugary beverages. All questions were answered to her satisfaction.  - Novel Coronavirus, NAA ([facility]) - Temperature monitoring; Future  2. Arthralgia, unspecified joint Comments: She was given refill of meloxicam. Encouraged to take no more than twice weekly as needed. Risks associated with long-term use, including renal disease was discussed with the patient.   3. Pure hypercholesterolemia Comments: Much improved while on Praluent. LDL down to 104. She is encouraged to incorporate more exercise into her daily routine to help her get below 100. She will rto in Nov 2021 for her next physical examination.      Patient was given opportunity to ask questions. Patient verbalized understanding of the plan and was able to repeat key elements of the plan. All questions were answered to their satisfaction.  Maximino Greenland, MD   I, Maximino Greenland, MD, have reviewed all documentation for this visit. The documentation on 07/08/20 for the exam, diagnosis, procedures, and orders are all accurate and complete.  THE PATIENT IS ENCOURAGED TO PRACTICE SOCIAL DISTANCING DUE TO THE COVID-19 PANDEMIC.

## 2020-07-08 NOTE — Chronic Care Management (AMB) (Signed)
Chronic Care Management Pharmacy Assistant   Name: Zoie Sarin  MRN: [id number] DOB: [birth year]  Reason for Encounter: Disease State - Hypertension and Lipids Adherence Call.   Patient Questions:  1.  Have you seen any other providers since your last visit? 06/19/2020 - Tiffany Dry Prong,MD - Cardiology    2.  Any changes in your medicines or health? No.    .  PCP : Glendale Chard, MD  Allergies:   Allergies  Allergen Reactions  . Atorvastatin Other (See Comments)    myalgias  . Rosuvastatin Other (See Comments)    myalgias    Medications: Outpatient Encounter Medications as of 07/08/2020  Medication Sig  . Alirocumab (PRALUENT) 150 MG/ML SOAJ Inject 150 mg into the skin every 14 (fourteen) days.  Marland Kitchen aspirin EC 81 MG tablet Take 81 mg by mouth daily.  . Cholecalciferol (VITAMIN D) 125 MCG (5000 UT) CAPS Take 5,000 Units by mouth daily.   Marland Kitchen latanoprost (XALATAN) 0.005 % ophthalmic solution   . meloxicam (MOBIC) 15 MG tablet Take 15 mg by mouth as needed.   . metoprolol succinate (TOPROL-XL) 25 MG 24 hr tablet TAKE ONE TABLET BY MOUTH EVERY EVENING  . PARoxetine (PAXIL) 20 MG tablet Take 1 tablet (20 mg total) by mouth daily.   No facility-administered encounter medications on file as of 07/08/2020.    Current Diagnosis: Patient Active Problem List   Diagnosis Date Noted  . Achalasia of esophagus   . Esophageal dysphagia   . Food impaction of esophagus   . Pure hypercholesterolemia 12/14/2018  . Essential hypertension 12/14/2018  . Tachycardia 06/17/2018  . Abnormal glucose 06/17/2018  . Primary snoring 07/12/2016  . SHOULDER PAIN, RIGHT, CHRONIC 12/17/2008  . HYPERLIPIDEMIA 10/24/2008  . ANXIETY 10/24/2008   Reviewed chart prior to disease state call. Spoke with patient regarding BP  Recent Office Vitals: BP Readings from Last 3 Encounters:  07/08/20 114/72  06/19/20 110/64  12/26/19 132/70   Pulse Readings from Last 3 Encounters:  07/08/20 85    06/19/20 68  12/26/19 76    Wt Readings from Last 3 Encounters:  06/19/20 145 lb 12.8 oz (66.1 kg)  12/26/19 142 lb 9.6 oz (64.7 kg)  12/26/19 142 lb 9.6 oz (64.7 kg)     Kidney Function Lab Results  Component Value Date/Time   CREATININE 0.87 06/19/2020 09:21 AM   CREATININE 0.87 12/26/2019 12:32 PM   GFRNONAA 65 06/19/2020 09:21 AM   GFRAA 75 06/19/2020 09:21 AM    BMP Latest Ref Rng & Units 06/19/2020 12/26/2019 06/21/2019  Glucose 65 - 99 mg/dL 91 90 90  BUN 8 - 27 mg/dL 18 17 17   Creatinine 0.57 - 1.00 mg/dL 0.87 0.87 0.88  BUN/Creat Ratio 12 - 28 21 20 19   Sodium 134 - 144 mmol/L 142 142 142  Potassium 3.5 - 5.2 mmol/L 4.6 4.4 4.4  Chloride 96 - 106 mmol/L 105 102 98  CO2 20 - 29 mmol/L 28 26 25   Calcium 8.7 - 10.3 mg/dL 9.1 9.6 9.7    . Current antihypertensive regimen:  o Metoprolol Succinate 25 mg daily every evening  . How often are you checking your Blood Pressure? Patient states she does not take blood pressure at home.  . Current home BP readings: None  . What recent interventions/DTPs have been made by any provider to improve Blood Pressure control since last CPP Visit: Patient states she does take her medications as directed by provider and she eats a healthy  diet.  . Any recent hospitalizations or ED visits since last visit with CPP? No  . What diet changes have been made to improve Blood Pressure Control?  . Patient states she eats healthy meal with small portions. . What exercise is being done to improve your Blood Pressure Control?  o Patient states she walks two miles 3 times a week.  Adherence Review: Is the patient currently on ACE/ARB medication? No  Does the patient have >5 day gap between last estimated fill dates? No   Comprehensive medication review performed; Spoke to patient regarding cholesterol  Lipid Panel    Component Value Date/Time   CHOL 186 06/19/2020 0921   TRIG 106 06/19/2020 0921   HDL 63 06/19/2020 0921   LDLCALC 104  (H) 06/19/2020 0921   LDLDIRECT 166.6 10/24/2008 0827    10-year ASCVD risk score: The 10-year ASCVD risk score Mikey Bussing DC Brooke Bonito., et al., 2013) is: 19%   Values used to calculate the score:     Age: 74 years     Sex: Female     Is Non-Hispanic African American: No     Diabetic: No     Tobacco smoker: No     Systolic Blood Pressure: 505 mmHg     Is BP treated: Yes     HDL Cholesterol: 63 mg/dL     Total Cholesterol: 186 mg/dL  . Current antihyperlipidemic regimen:  o Praluent  Inject 150 mg into skin every 14 days.  . Previous antihyperlipidemic medications tried: Atorvastatin , Rosuvastatin , Simvastatin  . ASCVD risk enhancing conditions: age >39, DM, HTN, CKD, CHF, current smoker  . What recent interventions/DTPs have been made by any provider to improve Cholesterol control since last CPP Visit: Patient states she is taking her medications as directed by provider.  . Any recent hospitalizations or ED visits since last visit with CPP? No  . What diet changes have been made to improve Cholesterol?  o Patient states she does eats healthy , no red meat or fried foods. o  . What exercise is being done to improve Cholesterol?  o Patient states she walks two miles 3 times a week.  Adherence Review: Does the patient have >5 day gap between last estimated fill dates? No       Goals Addressed            This Visit's Progress   . Pharmacy Care Plan   On track    CARE PLAN ENTRY (see longitudinal plan of care for additional care plan information)  Current Barriers:  . Chronic Disease Management support, education, and care coordination needs related to Hypertension, Hyperlipidemia, and Anxiety   Hypertension BP Readings from Last 3 Encounters:  12/26/19 132/70  12/26/19 132/70  12/11/19 136/74   . Pharmacist Clinical Goal(s): o Over the next 90 days, patient will work with PharmD and providers to achieve BP goal <130/80 . Current regimen:  o Metoprolol succinate 25mg   daily every evening . Interventions: o Provided dietary and exercise recommendations o Provided patient education about the importance of continuing blood pressure medication . Patient self care activities - Over the next 90 days, patient will: o Check BP once weekly, document, and provide at future appointments o Ensure daily salt intake < 2300 mg/day o Choose frozen foods that are low in salt content o Try to exercise for 30 minutes a day 5 times per week o Continue metoprolol daily as directed  Hyperlipidemia Lab Results  Component Value Date/Time  LDLCALC 214 (H) 06/21/2019 12:01 PM   LDLDIRECT 166.6 10/24/2008 08:27 AM   . Pharmacist Clinical Goal(s): o Over the next 90 days, patient will work with PharmD and providers to achieve LDL goal < 70 . Current regimen:  o Praluent 150mg  every 14 days . Interventions: o Provided dietary and exercise recommendations o Discussed patient is currently receiving assistance through a grant and Praluent copay is $0. Advised patient there may be alternative options if [employer] grant runs out . Patient self care activities - Over the next 90 days, patient will: o Follow up with Cardiology (appointment scheduled 9/16)  Anxiety . Pharmacist Clinical Goal(s) o Over the next 180 days, patient will work with PharmD and providers to manage symptoms of anxiety . Current regimen:  o Paroxetine 40mg  daily  . Interventions: o Reviewed most recent office visit notes from PCP and it was discussed decreasing patient to paroxetine 20mg  daily after she finished her current fill of paroxetine 40mg  o Collaborate with PCP to have a prescription or paroxetine 20mg  daily sent in . Patient self care activities - Over the next 180 days, patient will: o Decreased to paroxetine 20mg  daily after finishing current fill of paroxetine 40mg  (about 20 tablets remain)  Osteopenia  . Pharmacist Clinical Goal(s) o Over the next 180 days, patient will work with PharmD  and providers to strengthen bones and reduce the risk of fractures . Current regimen:  o Cholecalciferol 5000 units daily . Interventions: o Recommend patient start calcium supplement 1200mg  daily o Recommend patient start weight-bearing exercises 3 days per week . Patient self care activities - Over the next 180 days, patient will: o Start taking 1200mg  of calcium daily o Start performing weight bearing exercises 3 times weekly  Medication management . Pharmacist Clinical Goal(s): o Over the next 180 days, patient will work with PharmD and providers to maintain optimal medication adherence . Current pharmacy: [facility] . Interventions o Comprehensive medication review performed. o Continue current medication management strategy . Patient self care activities - Over the next 180 days, patient will: o Take medications as prescribed o Report any questions or concerns to PharmD and/or provider(s)  Initial goal documentation        Follow-Up:  Pharmacist Review  -   Jannette Fogo, CPP notified.  Judithann Sheen, [facility] Clinical Pharmacist Assistant [phone]

## 2020-07-09 LAB — NOVEL CORONAVIRUS, NAA: SARS-CoV-2, NAA: NOT DETECTED

## 2020-07-09 LAB — SARS-COV-2, NAA 2 DAY TAT

## 2020-07-10 ENCOUNTER — Telehealth: Payer: Self-pay

## 2020-07-10 NOTE — Telephone Encounter (Signed)
Left the pt message.  I called the pt because Dr. Bobetta Lime wanted to check and see how the pt is doing.

## 2020-07-11 ENCOUNTER — Encounter (INDEPENDENT_AMBULATORY_CARE_PROVIDER_SITE_OTHER): Payer: Self-pay

## 2020-07-16 ENCOUNTER — Other Ambulatory Visit: Payer: Self-pay | Admitting: Internal Medicine

## 2020-07-19 ENCOUNTER — Other Ambulatory Visit: Payer: Self-pay | Admitting: Internal Medicine

## 2020-07-28 DIAGNOSIS — R69 Illness, unspecified: Secondary | ICD-10-CM | POA: Diagnosis not present

## 2020-07-29 ENCOUNTER — Telehealth: Payer: Self-pay

## 2020-07-29 MED ORDER — PAROXETINE HCL 20 MG PO TABS: ORAL_TABLET | ORAL | 1 refills | Status: DC

## 2020-07-29 NOTE — Telephone Encounter (Signed)
The pt called and said that she thinks that she needed to go back to 40 mg of paxil daily.  The pt was told that Dr Baird Cancer said that the pt can increase to 30 mg to take 1 and 1/2 tablet daily and to schedule the pt a f/u.  The pt already is scheduled for a physical Nov 8th.

## 2020-08-05 ENCOUNTER — Telehealth: Payer: Self-pay

## 2020-08-11 ENCOUNTER — Encounter: Payer: Self-pay | Admitting: Internal Medicine

## 2020-08-11 ENCOUNTER — Ambulatory Visit (INDEPENDENT_AMBULATORY_CARE_PROVIDER_SITE_OTHER): Payer: Medicare HMO | Admitting: Internal Medicine

## 2020-08-11 ENCOUNTER — Other Ambulatory Visit: Payer: Self-pay

## 2020-08-11 VITALS — BP 122/68 | HR 87 | Temp 98.0°F | Ht 63.0 in | Wt 145.4 lb

## 2020-08-11 DIAGNOSIS — Z23 Encounter for immunization: Secondary | ICD-10-CM

## 2020-08-11 DIAGNOSIS — I10 Essential (primary) hypertension: Secondary | ICD-10-CM | POA: Diagnosis not present

## 2020-08-11 DIAGNOSIS — M542 Cervicalgia: Secondary | ICD-10-CM | POA: Diagnosis not present

## 2020-08-11 DIAGNOSIS — Z0000 Encounter for general adult medical examination without abnormal findings: Secondary | ICD-10-CM

## 2020-08-11 DIAGNOSIS — E559 Vitamin D deficiency, unspecified: Secondary | ICD-10-CM | POA: Diagnosis not present

## 2020-08-11 LAB — POCT URINALYSIS DIPSTICK
Bilirubin, UA: NEGATIVE
Glucose, UA: NEGATIVE
Ketones, UA: NEGATIVE
Nitrite, UA: NEGATIVE
Protein, UA: NEGATIVE
Spec Grav, UA: 1.015 (ref 1.010–1.025)
Urobilinogen, UA: 0.2 E.U./dL
pH, UA: 7 (ref 5.0–8.0)

## 2020-08-11 LAB — POCT UA - MICROALBUMIN
Albumin/Creatinine Ratio, Urine, POC: 30
Creatinine, POC: 200 mg/dL
Microalbumin Ur, POC: 10 mg/L

## 2020-08-11 NOTE — Progress Notes (Signed)
Rutherford Nail as a scribe for Maximino Greenland, MD.,have documented all relevant documentation on the behalf of Maximino Greenland, MD,as directed by  Maximino Greenland, MD while in the presence of Maximino Greenland, MD. This visit occurred during the SARS-CoV-2 public health emergency.  Safety protocols were in place, including screening questions prior to the visit, additional usage of staff PPE, and extensive cleaning of exam room while observing appropriate contact time as indicated for disinfecting solutions.  Subjective:     Patient ID: Mary Krueger , female    DOB: 1946/01/18 , 74 y.o.   MRN: [id number]   Chief Complaint  Patient presents with  . Annual Exam  . Hypertension    HPI  Pt is here today for Health maintenance exam.  She is followed by Dr. Edwinna Areola for her GYN exams. She can't recall her last visit. She has no specific concerns or complaints at this time.   Hypertension This is a chronic problem. The current episode started more than 1 year ago. The problem has been gradually improving since onset. The problem is controlled. Associated symptoms include neck pain. Pertinent negatives include no blurred vision, chest pain, headaches, orthopnea or shortness of breath.     Past Medical History:  Diagnosis Date  . Anxiety    Chronic  . Breast mass, left 2005   fatty on U/S  . Cataract   . Fibroid 1987   TAH/USO  . Glaucoma   . Hyperlipidemia   . Uses hearing aid    Bilateral   . Vitamin D deficiency      Family History  Problem Relation Age of Onset  . Diabetes Mother   . Prostate cancer Father   . Colon cancer Father   . Diabetes Brother   . Prostate cancer Brother   . Colon polyps Neg Hx   . Esophageal cancer Neg Hx   . Stomach cancer Neg Hx   . Rectal cancer Neg Hx      Current Outpatient Medications:  .  Alirocumab (PRALUENT) 150 MG/ML SOAJ, Inject 150 mg into the skin every 14 (fourteen) days., Disp: 2 mL, Rfl: 12 .   calcium-vitamin D (OSCAL WITH D) 250-125 MG-UNIT tablet, Take 1 tablet by mouth daily., Disp: , Rfl:  .  latanoprost (XALATAN) 0.005 % ophthalmic solution, , Disp: , Rfl:  .  meloxicam (MOBIC) 15 MG tablet, Take 15 mg by mouth as needed. , Disp: , Rfl:  .  metoprolol succinate (TOPROL-XL) 25 MG 24 hr tablet, TAKE ONE TABLET BY MOUTH EVERY EVENING, Disp: 90 tablet, Rfl: 0 .  PARoxetine (PAXIL) 20 MG tablet, Take 1 and 1/2 tablet by mouth daily (Patient taking differently: 40 mg. Take 1 tablet by mouth daily), Disp: 45 tablet, Rfl: 1 .  aspirin EC 81 MG tablet, Take 81 mg by mouth daily. (Patient not taking: Reported on 08/11/2020), Disp: , Rfl:  .  Cholecalciferol (VITAMIN D) 125 MCG (5000 UT) CAPS, Take 5,000 Units by mouth daily.  (Patient not taking: Reported on 08/11/2020), Disp: , Rfl:    Allergies  Allergen Reactions  . Atorvastatin Other (See Comments)    myalgias  . Rosuvastatin Other (See Comments)    myalgias      The patient states she uses post menopausal status for birth control. Last LMP was Patient's last menstrual period was 10/04/1984.. Negative for Dysmenorrhea. Negative for: breast discharge, breast lump(s), breast pain and breast self exam. Associated symptoms include abnormal vaginal bleeding.  Pertinent negatives include abnormal bleeding (hematology), anxiety, decreased libido, depression, difficulty falling sleep, dyspareunia, history of infertility, nocturia, sexual dysfunction, sleep disturbances, urinary incontinence, urinary urgency, vaginal discharge and vaginal itching. Diet regular.The patient states her exercise level is  intermittent.   . The patient's tobacco use is:  Social History   Tobacco Use  Smoking Status Former Smoker  Smokeless Tobacco Never Used  Tobacco Comment   quit in 1981  . She has been exposed to passive smoke. The patient's alcohol use is:  Social History   Substance and Sexual Activity  Alcohol Use No  . Alcohol/week: 0.0 standard drinks      Review of Systems  Constitutional: Negative.  Negative for fatigue.  HENT: Negative.   Eyes: Negative.  Negative for blurred vision.  Respiratory: Negative.  Negative for shortness of breath.   Cardiovascular: Negative.  Negative for chest pain and orthopnea.  Gastrointestinal: Negative.   Endocrine: Negative.  Negative for polydipsia, polyphagia and polyuria.  Genitourinary: Negative.   Musculoskeletal: Positive for neck pain.       She c/o neck tightness. Denies fall/trauma.  She denies UE weakness/tingling.    Skin: Negative.   Allergic/Immunologic: Negative.   Neurological: Negative.  Negative for dizziness and headaches.  Hematological: Negative.   Psychiatric/Behavioral: Negative.      Today's Vitals   08/11/20 1209  BP: 122/68  Pulse: 87  Temp: 98 F (36.7 C)  Weight: 145 lb 6.4 oz (66 kg)  Height: 5\' 3"  (1.6 m)  PainSc: 0-No pain   Body mass index is 25.76 kg/m.   Objective:  Physical Exam Vitals and nursing note reviewed.  Constitutional:      Appearance: Normal appearance.  HENT:     Head: Normocephalic and atraumatic.     Right Ear: Tympanic membrane, ear canal and external ear normal.     Left Ear: Tympanic membrane, ear canal and external ear normal.     Nose:     Comments: Deferred, masked    Mouth/Throat:     Comments: Deferred, masked Eyes:     Extraocular Movements: Extraocular movements intact.     Conjunctiva/sclera: Conjunctivae normal.     Pupils: Pupils are equal, round, and reactive to light.  Cardiovascular:     Rate and Rhythm: Normal rate and regular rhythm.     Pulses: Normal pulses.     Heart sounds: Normal heart sounds.  Pulmonary:     Effort: Pulmonary effort is normal.     Breath sounds: Normal breath sounds.  Chest:     Breasts: Tanner Score is 5.        Right: Normal.        Left: Normal.  Abdominal:     General: Abdomen is flat. Bowel sounds are normal.     Palpations: Abdomen is soft.  Genitourinary:    Comments:  deferred Musculoskeletal:        General: Normal range of motion.     Cervical back: Normal range of motion and neck supple.  Skin:    General: Skin is warm and dry.  Neurological:     General: No focal deficit present.     Mental Status: She is alert and oriented to person, place, and time.  Psychiatric:        Mood and Affect: Mood normal.        Behavior: Behavior normal.         Assessment And Plan:     1. Health maintenance examination Comments: A  full exam was performed. Importance of monthly self breast exams was discussed with the patient. PATIENT IS ADVISED TO GET 30-45 MINUTES REGULAR EXERCISE NO LESS THAN FOUR TO FIVE DAYS PER WEEK - BOTH WEIGHTBEARING EXERCISES AND AEROBIC ARE RECOMMENDED.  PATIENT IS ADVISED TO FOLLOW A HEALTHY DIET WITH AT LEAST SIX FRUITS/VEGGIES PER DAY, DECREASE INTAKE OF RED MEAT, AND TO INCREASE FISH INTAKE TO TWO DAYS PER WEEK.  MEATS/FISH SHOULD NOT BE FRIED, BAKED OR BROILED IS PREFERABLE.  I SUGGEST WEARING SPF 50 SUNSCREEN ON EXPOSED PARTS AND ESPECIALLY WHEN IN THE DIRECT SUNLIGHT FOR AN EXTENDED PERIOD OF TIME.  PLEASE AVOID FAST FOOD RESTAURANTS AND INCREASE YOUR WATER INTAKE.  2. Essential hypertension Comments: Chronic, well controlled. She will continue with current meds. She is encouraged to avoid adding salt to her foods. EKG not performed, 06/2020 results reviewed. She will rto in six months for re-evaluation.  - CBC - POCT Urinalysis Dipstick (81002) - POCT UA - Microalbumin  3. Cervicalgia Comments: Pt advised she may benefit from chiropractic and/or laser therapy. I will refer her to [facility] for further evaluation. She was also given some stretch - Ambulatory referral to Chiropractic  4. Vitamin D deficiency Comments: I will check vitamin D level and supplement as needed.  - VITAMIN D 25 Hydroxy (Vit-D Deficiency, Fractures)  5. Need for influenza vaccination Comments: She was given high dose flu vaccine.  - Flu  Vaccine QUAD High Dose(Fluad)    Patient was given opportunity to ask questions. Patient verbalized understanding of the plan and was able to repeat key elements of the plan. All questions were answered to their satisfaction.   Maximino Greenland, MD   I, Maximino Greenland, MD, have reviewed all documentation for this visit. The documentation on 08/11/20 for the exam, diagnosis, procedures, and orders are all accurate and complete.  THE PATIENT IS ENCOURAGED TO PRACTICE SOCIAL DISTANCING DUE TO THE COVID-19 PANDEMIC.

## 2020-08-11 NOTE — Patient Instructions (Signed)
Hypertension, Adult Hypertension is another name for high blood pressure. High blood pressure forces your heart to work harder to pump blood. This can cause problems over time. There are two numbers in a blood pressure reading. There is a top number (systolic) over a bottom number (diastolic). It is best to have a blood pressure that is below 120/80. Healthy choices can help lower your blood pressure, or you may need medicine to help lower it. What are the causes? The cause of this condition is not known. Some conditions may be related to high blood pressure. What increases the risk?  Smoking.  Having type 2 diabetes mellitus, high cholesterol, or both.  Not getting enough exercise or physical activity.  Being overweight.  Having too much fat, sugar, calories, or salt (sodium) in your diet.  Drinking too much alcohol.  Having long-term (chronic) kidney disease.  Having a family history of high blood pressure.  Age. Risk increases with age.  Race. You may be at higher risk if you are African American.  Gender. Men are at higher risk than women before age 45. After age 65, women are at higher risk than men.  Having obstructive sleep apnea.  Stress. What are the signs or symptoms?  High blood pressure may not cause symptoms. Very high blood pressure (hypertensive crisis) may cause: ? Headache. ? Feelings of worry or nervousness (anxiety). ? Shortness of breath. ? Nosebleed. ? A feeling of being sick to your stomach (nausea). ? Throwing up (vomiting). ? Changes in how you see. ? Very bad chest pain. ? Seizures. How is this treated?  This condition is treated by making healthy lifestyle changes, such as: ? Eating healthy foods. ? Exercising more. ? Drinking less alcohol.  Your health care provider may prescribe medicine if lifestyle changes are not enough to get your blood pressure under control, and if: ? Your top number is above 130. ? Your bottom number is above  80.  Your personal target blood pressure may vary. Follow these instructions at home: Eating and drinking   If told, follow the DASH eating plan. To follow this plan: ? Fill one half of your plate at each meal with fruits and vegetables. ? Fill one fourth of your plate at each meal with whole grains. Whole grains include whole-wheat pasta, brown rice, and whole-grain bread. ? Eat or drink low-fat dairy products, such as skim milk or low-fat yogurt. ? Fill one fourth of your plate at each meal with low-fat (lean) proteins. Low-fat proteins include fish, chicken without skin, eggs, beans, and tofu. ? Avoid fatty meat, cured and processed meat, or chicken with skin. ? Avoid pre-made or processed food.  Eat less than 1,500 mg of salt each day.  Do not drink alcohol if: ? Your doctor tells you not to drink. ? You are pregnant, may be pregnant, or are planning to become pregnant.  If you drink alcohol: ? Limit how much you use to:  0-1 drink a day for women.  0-2 drinks a day for men. ? Be aware of how much alcohol is in your drink. In the [place] one drink equals one 12 oz bottle of beer (355 mL), one 5 oz glass of wine (148 mL), or one 1 oz glass of hard liquor (44 mL). Lifestyle   Work with your doctor to stay at a healthy weight or to lose weight. Ask your doctor what the best weight is for you.  Get at least 30 minutes of exercise most   days of the week. This may include walking, swimming, or biking.  Get at least 30 minutes of exercise that strengthens your muscles (resistance exercise) at least 3 days a week. This may include lifting weights or doing Pilates.  Do not use any products that contain nicotine or tobacco, such as cigarettes, e-cigarettes, and chewing tobacco. If you need help quitting, ask your doctor.  Check your blood pressure at home as told by your doctor.  Keep all follow-up visits as told by your doctor. This is important. Medicines  Take over-the-counter  and prescription medicines only as told by your doctor. Follow directions carefully.  Do not skip doses of blood pressure medicine. The medicine does not work as well if you skip doses. Skipping doses also puts you at risk for problems.  Ask your doctor about side effects or reactions to medicines that you should watch for. Contact a doctor if you:  Think you are having a reaction to the medicine you are taking.  Have headaches that keep coming back (recurring).  Feel dizzy.  Have swelling in your ankles.  Have trouble with your vision. Get help right away if you:  Get a very bad headache.  Start to feel mixed up (confused).  Feel weak or numb.  Feel faint.  Have very bad pain in your: ? Chest. ? Belly (abdomen).  Throw up more than once.  Have trouble breathing. Summary  Hypertension is another name for high blood pressure.  High blood pressure forces your heart to work harder to pump blood.  For most people, a normal blood pressure is less than 120/80.  Making healthy choices can help lower blood pressure. If your blood pressure does not get lower with healthy choices, you may need to take medicine. This information is not intended to replace advice given to you by your health care provider. Make sure you discuss any questions you have with your health care provider. Document Revised: 05/31/2018 Document Reviewed: 05/31/2018 Elsevier Patient Education  2020 [place] Maintenance, Female Adopting a healthy lifestyle and getting preventive care are important in promoting health and wellness. Ask your health care provider about:  The right schedule for you to have regular tests and exams.  Things you can do on your own to prevent diseases and keep yourself healthy. What should I know about diet, weight, and exercise? Eat a healthy diet   Eat a diet that includes plenty of vegetables, fruits, low-fat dairy products, and lean protein.  Do not eat a  lot of foods that are high in solid fats, added sugars, or sodium. Maintain a healthy weight Body mass index (BMI) is used to identify weight problems. It estimates body fat based on height and weight. Your health care provider can help determine your BMI and help you achieve or maintain a healthy weight. Get regular exercise Get regular exercise. This is one of the most important things you can do for your health. Most adults should:  Exercise for at least 150 minutes each week. The exercise should increase your heart rate and make you sweat (moderate-intensity exercise).  Do strengthening exercises at least twice a week. This is in addition to the moderate-intensity exercise.  Spend less time sitting. Even light physical activity can be beneficial. Watch cholesterol and blood lipids Have your blood tested for lipids and cholesterol at 76 years of age, then have this test every 5 years. Have your cholesterol levels checked more often if:  Your lipid or cholesterol levels are  high.  You are older than 76 years of age.  You are at high risk for heart disease. What should I know about cancer screening? Depending on your health history and family history, you may need to have cancer screening at various ages. This may include screening for:  Breast cancer.  Cervical cancer.  Colorectal cancer.  Skin cancer.  Lung cancer. What should I know about heart disease, diabetes, and high blood pressure? Blood pressure and heart disease  High blood pressure causes heart disease and increases the risk of stroke. This is more likely to develop in people who have high blood pressure readings, are of African descent, or are overweight.  Have your blood pressure checked: ? Every 3-5 years if you are 36-15 years of age. ? Every year if you are 6 years old or older. Diabetes Have regular diabetes screenings. This checks your fasting blood sugar level. Have the screening done:  Once every  three years after age 48 if you are at a normal weight and have a low risk for diabetes.  More often and at a younger age if you are overweight or have a high risk for diabetes. What should I know about preventing infection? Hepatitis B If you have a higher risk for hepatitis B, you should be screened for this virus. Talk with your health care provider to find out if you are at risk for hepatitis B infection. Hepatitis C Testing is recommended for:  Everyone born from [birth year] through 1967.  Anyone with known risk factors for hepatitis C. Sexually transmitted infections (STIs)  Get screened for STIs, including gonorrhea and chlamydia, if: ? You are sexually active and are younger than 76 years of age. ? You are older than 76 years of age and your health care provider tells you that you are at risk for this type of infection. ? Your sexual activity has changed since you were last screened, and you are at increased risk for chlamydia or gonorrhea. Ask your health care provider if you are at risk.  Ask your health care provider about whether you are at high risk for HIV. Your health care provider may recommend a prescription medicine to help prevent HIV infection. If you choose to take medicine to prevent HIV, you should first get tested for HIV. You should then be tested every 3 months for as long as you are taking the medicine. Pregnancy  If you are about to stop having your period (premenopausal) and you may become pregnant, seek counseling before you get pregnant.  Take 400 to 800 micrograms (mcg) of folic acid every day if you become pregnant.  Ask for birth control (contraception) if you want to prevent pregnancy. Osteoporosis and menopause Osteoporosis is a disease in which the bones lose minerals and strength with aging. This can result in bone fractures. If you are 54 years old or older, or if you are at risk for osteoporosis and fractures, ask your health care provider if you  should:  Be screened for bone loss.  Take a calcium or vitamin D supplement to lower your risk of fractures.  Be given hormone replacement therapy (HRT) to treat symptoms of menopause. Follow these instructions at home: Lifestyle  Do not use any products that contain nicotine or tobacco, such as cigarettes, e-cigarettes, and chewing tobacco. If you need help quitting, ask your health care provider.  Do not use street drugs.  Do not share needles.  Ask your health care provider for help if you  need support or information about quitting drugs. Alcohol use  Do not drink alcohol if: ? Your health care provider tells you not to drink. ? You are pregnant, may be pregnant, or are planning to become pregnant.  If you drink alcohol: ? Limit how much you use to 0-1 drink a day. ? Limit intake if you are breastfeeding.  Be aware of how much alcohol is in your drink. In the [place] one drink equals one 12 oz bottle of beer (355 mL), one 5 oz glass of wine (148 mL), or one 1 oz glass of hard liquor (44 mL). General instructions  Schedule regular health, dental, and eye exams.  Stay current with your vaccines.  Tell your health care provider if: ? You often feel depressed. ? You have ever been abused or do not feel safe at home. Summary  Adopting a healthy lifestyle and getting preventive care are important in promoting health and wellness.  Follow your health care provider's instructions about healthy diet, exercising, and getting tested or screened for diseases.  Follow your health care provider's instructions on monitoring your cholesterol and blood pressure. This information is not intended to replace advice given to you by your health care provider. Make sure you discuss any questions you have with your health care provider. Document Revised: 09/13/2018 Document Reviewed: 09/13/2018 Elsevier Patient Education  2020 [employer].

## 2020-08-12 LAB — CBC
Hematocrit: 39.1 % (ref 34.0–46.6)
Hemoglobin: 14 g/dL (ref 11.1–15.9)
MCH: 33.6 pg — ABNORMAL HIGH (ref 26.6–33.0)
MCHC: 35.8 g/dL — ABNORMAL HIGH (ref 31.5–35.7)
MCV: 94 fL (ref 79–97)
Platelets: 343 10*3/uL (ref 150–450)
RBC: 4.17 x10E6/uL (ref 3.77–5.28)
RDW: 11.9 % (ref 11.7–15.4)
WBC: 7.6 10*3/uL (ref 3.4–10.8)

## 2020-08-12 LAB — VITAMIN D 25 HYDROXY (VIT D DEFICIENCY, FRACTURES): Vit D, 25-Hydroxy: 26.8 ng/mL — ABNORMAL LOW (ref 30.0–100.0)

## 2020-08-16 ENCOUNTER — Encounter: Payer: Self-pay | Admitting: Internal Medicine

## 2020-08-20 ENCOUNTER — Encounter: Payer: Self-pay | Admitting: Internal Medicine

## 2020-08-20 ENCOUNTER — Other Ambulatory Visit: Payer: Self-pay

## 2020-08-20 MED ORDER — VITAMIN D (ERGOCALCIFEROL) 1.25 MG (50000 UNIT) PO CAPS: 50000.0000 [IU] | ORAL_CAPSULE | ORAL | 1 refills | Status: DC

## 2020-09-10 ENCOUNTER — Telehealth: Payer: Self-pay

## 2020-09-10 NOTE — Progress Notes (Signed)
Chronic Care Management Pharmacy Assistant   Name: Mary Krueger  MRN: [id number] DOB: [birth year]  Reason for Encounter: Disease State - Hypertension and Lipids Adherence Call.  Patient Questions:  1.  Have you seen any other providers since your last visit?  08/11/20 Glendale Chard, MD Health maintenance exam.   2.  Any changes in your medicines or health?   No. .  PCP : Glendale Chard, MD  Allergies:   Allergies  Allergen Reactions  . Atorvastatin Other (See Comments)    myalgias  . Rosuvastatin Other (See Comments)    myalgias    Medications: Outpatient Encounter Medications as of 09/10/2020  Medication Sig  . Alirocumab (PRALUENT) 150 MG/ML SOAJ Inject 150 mg into the skin every 14 (fourteen) days.  Marland Kitchen aspirin EC 81 MG tablet Take 81 mg by mouth daily. (Patient not taking: Reported on 08/11/2020)  . calcium-vitamin D (OSCAL WITH D) 250-125 MG-UNIT tablet Take 1 tablet by mouth daily.  . Cholecalciferol (VITAMIN D) 125 MCG (5000 UT) CAPS Take 5,000 Units by mouth daily.  (Patient not taking: Reported on 08/11/2020)  . latanoprost (XALATAN) 0.005 % ophthalmic solution   . meloxicam (MOBIC) 15 MG tablet Take 15 mg by mouth as needed.   . metoprolol succinate (TOPROL-XL) 25 MG 24 hr tablet TAKE ONE TABLET BY MOUTH EVERY EVENING  . PARoxetine (PAXIL) 20 MG tablet Take 1 and 1/2 tablet by mouth daily (Patient taking differently: 40 mg. Take 1 tablet by mouth daily)  . Vitamin D, Ergocalciferol, (DRISDOL) 1.25 MG (50000 UNIT) CAPS capsule Take 1 capsule (50,000 Units total) by mouth 2 (two) times a week.   No facility-administered encounter medications on file as of 09/10/2020.    Current Diagnosis: Patient Active Problem List   Diagnosis Date Noted  . Achalasia of esophagus   . Esophageal dysphagia   . Food impaction of esophagus   . Pure hypercholesterolemia 12/14/2018  . Essential hypertension 12/14/2018  . Tachycardia 06/17/2018  . Abnormal glucose 06/17/2018  .  Primary snoring 07/12/2016  . SHOULDER PAIN, RIGHT, CHRONIC 12/17/2008  . HYPERLIPIDEMIA 10/24/2008  . ANXIETY 10/24/2008   Reviewed chart prior to disease state call. Spoke with patient regarding BP  Recent Office Vitals: BP Readings from Last 3 Encounters:  08/11/20 122/68  07/08/20 114/72  06/19/20 110/64   Pulse Readings from Last 3 Encounters:  08/11/20 87  07/08/20 85  06/19/20 68    Wt Readings from Last 3 Encounters:  08/11/20 145 lb 6.4 oz (66 kg)  06/19/20 145 lb 12.8 oz (66.1 kg)  12/26/19 142 lb 9.6 oz (64.7 kg)     Kidney Function Lab Results  Component Value Date/Time   CREATININE 0.87 06/19/2020 09:21 AM   CREATININE 0.87 12/26/2019 12:32 PM   GFRNONAA 65 06/19/2020 09:21 AM   GFRAA 75 06/19/2020 09:21 AM    BMP Latest Ref Rng & Units 06/19/2020 12/26/2019 06/21/2019  Glucose 65 - 99 mg/dL 91 90 90  BUN 8 - 27 mg/dL 18 17 17   Creatinine 0.57 - 1.00 mg/dL 0.87 0.87 0.88  BUN/Creat Ratio 12 - 28 21 20 19   Sodium 134 - 144 mmol/L 142 142 142  Potassium 3.5 - 5.2 mmol/L 4.6 4.4 4.4  Chloride 96 - 106 mmol/L 105 102 98  CO2 20 - 29 mmol/L 28 26 25   Calcium 8.7 - 10.3 mg/dL 9.1 9.6 9.7    . Current antihypertensive regimen:   Metoprolol succinate 25 mg daily every evening  .  How often are you checking your Blood Pressure? weekly   . Current home BP readings: None  . What recent interventions/DTPs have been made by any provider to improve Blood Pressure control since last CPP Visit: Patient stated she is taking her medication as directed per provider.  . Any recent hospitalizations or ED visits since last visit with CPP? No   . What diet changes have been made to improve Blood Pressure Control?  Patient stated she is watching her salt intake.  . What exercise is being done to improve your Blood Pressure Control?  o Patient stated she goes swimming three times a week and walks 45 minutes the other days.  Adherence Review: Is the patient currently on  ACE/ARB medication? No Does the patient have >5 day gap between last estimated fill dates? No.    Spoke to patient regarding cholesterol  Lipid Panel    Component Value Date/Time   CHOL 186 06/19/2020 0921   TRIG 106 06/19/2020 0921   HDL 63 06/19/2020 0921   LDLCALC 104 (H) 06/19/2020 0921   LDLDIRECT 166.6 10/24/2008 0827    10-year ASCVD risk score: The 10-year ASCVD risk score Mikey Bussing DC Brooke Bonito., et al., 2013) is: 21.4%   Values used to calculate the score:     Age: 74 years     Sex: Female     Is Non-Hispanic African American: No     Diabetic: No     Tobacco smoker: No     Systolic Blood Pressure: 202 mmHg     Is BP treated: Yes     HDL Cholesterol: 63 mg/dL     Total Cholesterol: 186 mg/dL  . Current antihyperlipidemic regimen:  o Praluent 150 mg into skin every 14 days.  . Previous antihyperlipidemic medications tried: Atorvastatin, Fenofibrate, Pitavastatin .  Marland Kitchen ASCVD risk enhancing conditions: age >42 and HTN   . What recent interventions/DTPs have been made by any provider to improve Cholesterol control since last CPP Visit:   Patient is taking her medication but she is awaiting for a refill to be approved by pharmacist.  . Any recent hospitalizations or ED visits since last visit with CPP? No  . What diet changes have been made to improve Cholesterol?  o Patient stated she is watching her fats and she is eating healthy.   . What exercise is being done to improve Cholesterol?   Patient stated she goes swimming three times a week and walks 45 minutes the other days.  Adherence Review: Does the patient have >5 day gap between last estimated fill dates? No        Goals Addressed            This Visit's Progress   . Pharmacy Care Plan   On track    CARE PLAN ENTRY (see longitudinal plan of care for additional care plan information)  Current Barriers:  . Chronic Disease Management support, education, and care coordination needs related to Hypertension,  Hyperlipidemia, and Anxiety   Hypertension BP Readings from Last 3 Encounters:  12/26/19 132/70  12/26/19 132/70  12/11/19 136/74   . Pharmacist Clinical Goal(s): o Over the next 90 days, patient will work with PharmD and providers to achieve BP goal <130/80 . Current regimen:  o Metoprolol succinate 25mg  daily every evening . Interventions: o Provided dietary and exercise recommendations o Provided patient education about the importance of continuing blood pressure medication . Patient self care activities - Over the next 90 days, patient will: o  Check BP once weekly, document, and provide at future appointments o Ensure daily salt intake < 2300 mg/day o Choose frozen foods that are low in salt content o Try to exercise for 30 minutes a day 5 times per week o Continue metoprolol daily as directed  Hyperlipidemia Lab Results  Component Value Date/Time   LDLCALC 214 (H) 06/21/2019 12:01 PM   LDLDIRECT 166.6 10/24/2008 08:27 AM   . Pharmacist Clinical Goal(s): o Over the next 90 days, patient will work with PharmD and providers to achieve LDL goal < 70 . Current regimen:  o Praluent 150mg  every 14 days . Interventions: o Provided dietary and exercise recommendations o Discussed patient is currently receiving assistance through a grant and Praluent copay is $0. Advised patient there may be alternative options if [employer] grant runs out . Patient self care activities - Over the next 90 days, patient will: o Follow up with Cardiology (appointment scheduled 9/16)  Anxiety . Pharmacist Clinical Goal(s) o Over the next 180 days, patient will work with PharmD and providers to manage symptoms of anxiety . Current regimen:  o Paroxetine 40mg  daily  . Interventions: o Reviewed most recent office visit notes from PCP and it was discussed decreasing patient to paroxetine 20mg  daily after she finished her current fill of paroxetine 40mg  o Collaborate with PCP to have a prescription or  paroxetine 20mg  daily sent in . Patient self care activities - Over the next 180 days, patient will: o Decreased to paroxetine 20mg  daily after finishing current fill of paroxetine 40mg  (about 20 tablets remain)  Osteopenia  . Pharmacist Clinical Goal(s) o Over the next 180 days, patient will work with PharmD and providers to strengthen bones and reduce the risk of fractures . Current regimen:  o Cholecalciferol 5000 units daily . Interventions: o Recommend patient start calcium supplement 1200mg  daily o Recommend patient start weight-bearing exercises 3 days per week . Patient self care activities - Over the next 180 days, patient will: o Start taking 1200mg  of calcium daily o Start performing weight bearing exercises 3 times weekly  Medication management . Pharmacist Clinical Goal(s): o Over the next 180 days, patient will work with PharmD and providers to maintain optimal medication adherence . Current pharmacy: [facility] . Interventions o Comprehensive medication review performed. o Continue current medication management strategy . Patient self care activities - Over the next 180 days, patient will: o Take medications as prescribed o Report any questions or concerns to PharmD and/or provider(s)  Initial goal documentation        Follow-Up:  Pharmacist Review Patient is awaiting for approval for her medication Alirocumab.(Praluent). Patient stated she was due to take her injection yesterday, She is going to go to the pharmacy to check on it on 09/18/20. Told patient to give [place] a call if we could be of any help with this process.  Orlando Penner, CCP Notified.  Judithann Sheen, [facility] Clinical Pharmacist Assistant [phone]

## 2020-10-17 ENCOUNTER — Other Ambulatory Visit: Payer: Self-pay | Admitting: Internal Medicine

## 2020-10-30 DIAGNOSIS — H524 Presbyopia: Secondary | ICD-10-CM | POA: Diagnosis not present

## 2020-11-13 ENCOUNTER — Ambulatory Visit (HOSPITAL_BASED_OUTPATIENT_CLINIC_OR_DEPARTMENT_OTHER): Payer: Medicare HMO | Admitting: Obstetrics & Gynecology

## 2020-11-27 ENCOUNTER — Encounter (HOSPITAL_BASED_OUTPATIENT_CLINIC_OR_DEPARTMENT_OTHER): Payer: Self-pay | Admitting: Obstetrics & Gynecology

## 2020-11-27 ENCOUNTER — Other Ambulatory Visit: Payer: Self-pay

## 2020-11-27 ENCOUNTER — Ambulatory Visit (INDEPENDENT_AMBULATORY_CARE_PROVIDER_SITE_OTHER): Payer: Medicare HMO | Admitting: Obstetrics & Gynecology

## 2020-11-27 VITALS — BP 132/67 | HR 74 | Ht 64.0 in | Wt 146.6 lb

## 2020-11-27 DIAGNOSIS — Z01419 Encounter for gynecological examination (general) (routine) without abnormal findings: Secondary | ICD-10-CM | POA: Diagnosis not present

## 2020-11-27 DIAGNOSIS — I10 Essential (primary) hypertension: Secondary | ICD-10-CM

## 2020-11-27 DIAGNOSIS — F411 Generalized anxiety disorder: Secondary | ICD-10-CM | POA: Diagnosis not present

## 2020-11-27 DIAGNOSIS — G4700 Insomnia, unspecified: Secondary | ICD-10-CM | POA: Diagnosis not present

## 2020-11-27 DIAGNOSIS — R69 Illness, unspecified: Secondary | ICD-10-CM | POA: Diagnosis not present

## 2020-11-27 MED ORDER — TRAZODONE HCL 50 MG PO TABS: ORAL_TABLET | ORAL | 0 refills | Status: DC

## 2020-11-27 MED ORDER — PAROXETINE HCL 40 MG PO TABS
40.0000 mg | ORAL_TABLET | Freq: Every day | ORAL | 0 refills | Status: DC
Start: 2020-11-27 — End: 2021-01-27

## 2020-11-27 NOTE — Progress Notes (Signed)
75 y.o. G0P0000 Divorced White or Caucasian female here for breast and pelvic exam.  Denies vaginal bleeding.  Having a lot of tossing and turning at night.  She has used Trazodone in the past.  Would like to restart to see if can get her sleep pattern back to normal.  Risks of serotonin syndrome with Paxil use discussed.    Denies vaginal bleeding.  Patient's last menstrual period was 10/04/1984.          Sexually active: No.  H/O STD:  no  Health Maintenance: PCP:  Glendale Chard.  Last wellness appt was 08/2020.  Did blood work at that appt:  Yes.  Vit D was lower and she was started on supplemental Vit D Vaccines are up to date:  Yes,  She has completed the pneumonia vaccination series Colonoscopy:  12/2017 MMG:  10/26/2019 BMD:  2020 Last pap smear:  Not indicated.   H/o abnormal pap smear:      reports that she has quit smoking. She has never used smokeless tobacco. She reports that she does not drink alcohol and does not use drugs.  Past Medical History:  Diagnosis Date  . Anxiety    Chronic  . Breast mass, left 2005   fatty on U/S  . Cataract   . Fibroid 1987   TAH/USO  . Glaucoma   . Hyperlipidemia   . Uses hearing aid    Bilateral   . Vitamin D deficiency     Past Surgical History:  Procedure Laterality Date  . BALLOON DILATION N/A 03/05/2019   Procedure: BALLOON DILATION;  Surgeon: Mauri Pole, MD;  Location: [facility];  Service: Endoscopy;  Laterality: N/A;  TTS balloon dilation  . CATARACT EXTRACTION W/ INTRAOCULAR LENS IMPLANT Bilateral 12/2015 01/2016  . COSMETIC SURGERY     Mini face lift  . ESOPHAGEAL MANOMETRY N/A 03/05/2019   Procedure: ESOPHAGEAL MANOMETRY (EM);  Surgeon: Mauri Pole, MD;  Location: [facility];  Service: Endoscopy;  Laterality: N/A;  . ESOPHAGOGASTRODUODENOSCOPY  01/2019  . ESOPHAGOGASTRODUODENOSCOPY (EGD) WITH PROPOFOL N/A 01/17/2019   Procedure: ESOPHAGOGASTRODUODENOSCOPY (EGD) WITH PROPOFOL;  Surgeon: Jerene Bears,  MD;  Location: [place];  Service: Gastroenterology;  Laterality: N/A;  . ESOPHAGOGASTRODUODENOSCOPY (EGD) WITH PROPOFOL N/A 03/05/2019   Procedure: ESOPHAGOGASTRODUODENOSCOPY (EGD) WITH PROPOFOL;  Surgeon: Mauri Pole, MD;  Location: [facility];  Service: Endoscopy;  Laterality: N/A;  . EYE SURGERY  06/2012   eye lift  . FOREIGN BODY REMOVAL  01/17/2019   Procedure: FOREIGN BODY REMOVAL;  Surgeon: Jerene Bears, MD;  Location: [facility];  Service: Gastroenterology;;  . LIPOMA EXCISION Left 2009   left arm  . STOMACH SURGERY  1997   Sphincter  . TONSILLECTOMY AND ADENOIDECTOMY  1972  . TOTAL ABDOMINAL HYSTERECTOMY W/ BILATERAL SALPINGOOPHORECTOMY    . TRIGGER FINGER RELEASE     right    Current Outpatient Medications  Medication Sig Dispense Refill  . Alirocumab (PRALUENT) 150 MG/ML SOAJ Inject 150 mg into the skin every 14 (fourteen) days. 2 mL 12  . aspirin EC 81 MG tablet Take 81 mg by mouth daily.    . calcium-vitamin D (OSCAL WITH D) 250-125 MG-UNIT tablet Take 1 tablet by mouth daily.    . Cholecalciferol (VITAMIN D) 125 MCG (5000 UT) CAPS Take 5,000 Units by mouth daily.    Marland Kitchen latanoprost (XALATAN) 0.005 % ophthalmic solution     . meloxicam (MOBIC) 15 MG tablet Take 15 mg by mouth as needed.     Marland Kitchen  metoprolol succinate (TOPROL-XL) 25 MG 24 hr tablet TAKE ONE TABLET BY MOUTH EVERY EVENING 90 tablet 0  . PARoxetine (PAXIL) 20 MG tablet Take 1 and 1/2 tablet by mouth daily (Patient taking differently: 40 mg. Take 1 tablet by mouth daily) 45 tablet 1  . Vitamin D, Ergocalciferol, (DRISDOL) 1.25 MG (50000 UNIT) CAPS capsule Take 1 capsule (50,000 Units total) by mouth 2 (two) times a week. 24 capsule 1   No current facility-administered medications for this visit.    Family History  Problem Relation Age of Onset  . Diabetes Mother   . Prostate cancer Father   . Colon cancer Father   . Diabetes Brother   . Prostate cancer Brother   . Colon polyps Neg Hx   .  Esophageal cancer Neg Hx   . Stomach cancer Neg Hx   . Rectal cancer Neg Hx     Review of Systems  Psychiatric/Behavioral: Positive for sleep disturbance.    Exam:   BP 132/67   Pulse 74   Ht 5\' 4"  (1.626 m)   Wt 146 lb 9.6 oz (66.5 kg)   LMP 10/04/1984   BMI 25.16 kg/m   Height: 5\' 4"  (162.6 cm)  General appearance: alert, cooperative and appears stated age Breasts: normal appearance, no masses or tenderness Abdomen: soft, non-tender; bowel sounds normal; no masses,  no organomegaly Lymph nodes: Cervical, supraclavicular, and axillary nodes normal.  No abnormal inguinal nodes palpated Neurologic: Grossly normal  Pelvic: External genitalia:  no lesions              Urethra:  normal appearing urethra with no masses, tenderness or lesions              Bartholins and Skenes: normal                 Vagina: normal appearing vagina with normal color and discharge, no lesions              Cervix: absent              Pap taken: No. Bimanual Exam:  Uterus:  uterus absent              Adnexa: no mass, fullness, tenderness               Rectovaginal: Confirms               Anus:  normal sphincter tone, no lesions  Chaperone, Shela Nevin, RN, was present for exam.  Assessment/Plan: 1. Encntr for gyn exam (general) (routine) w/o abn findings - pap not indicated - MMG done 10/26/2019 - colonoscopy 3/19 - BMD done 2020 - lab work done with Dr. Baird Cancer - vaccines are all up to date except Covid booster.  She declines.  2. Essential hypertension  3. Anxiety state  4. Insomnia, unspecified type - traZODone (DESYREL) 50 MG tablet; 1/2 tab qhs prn insomnia  Dispense: 30 tablet; Refill: 0   22 minutes of total time was spent for this patient encounter, including preparation, face-to-face counseling with the patient and coordination of care, and documentation of the encounter.

## 2020-12-16 ENCOUNTER — Ambulatory Visit: Payer: Medicare HMO

## 2020-12-23 ENCOUNTER — Other Ambulatory Visit: Payer: Self-pay | Admitting: Physical Medicine and Rehabilitation

## 2020-12-23 DIAGNOSIS — Z1231 Encounter for screening mammogram for malignant neoplasm of breast: Secondary | ICD-10-CM

## 2021-01-02 DEATH — deceased

## 2021-01-07 ENCOUNTER — Ambulatory Visit (INDEPENDENT_AMBULATORY_CARE_PROVIDER_SITE_OTHER): Payer: Medicare HMO

## 2021-01-07 ENCOUNTER — Encounter: Payer: Self-pay | Admitting: Internal Medicine

## 2021-01-07 ENCOUNTER — Ambulatory Visit (INDEPENDENT_AMBULATORY_CARE_PROVIDER_SITE_OTHER): Payer: Medicare HMO | Admitting: Internal Medicine

## 2021-01-07 ENCOUNTER — Other Ambulatory Visit: Payer: Self-pay

## 2021-01-07 VITALS — BP 114/66 | HR 71 | Temp 97.9°F | Ht 64.0 in | Wt 147.0 lb

## 2021-01-07 DIAGNOSIS — R69 Illness, unspecified: Secondary | ICD-10-CM | POA: Diagnosis not present

## 2021-01-07 DIAGNOSIS — Z6825 Body mass index (BMI) 25.0-25.9, adult: Secondary | ICD-10-CM

## 2021-01-07 DIAGNOSIS — Z0000 Encounter for general adult medical examination without abnormal findings: Secondary | ICD-10-CM

## 2021-01-07 DIAGNOSIS — E663 Overweight: Secondary | ICD-10-CM

## 2021-01-07 DIAGNOSIS — Z23 Encounter for immunization: Secondary | ICD-10-CM

## 2021-01-07 DIAGNOSIS — I10 Essential (primary) hypertension: Secondary | ICD-10-CM

## 2021-01-07 DIAGNOSIS — F5101 Primary insomnia: Secondary | ICD-10-CM

## 2021-01-07 DIAGNOSIS — F411 Generalized anxiety disorder: Secondary | ICD-10-CM | POA: Diagnosis not present

## 2021-01-07 MED ORDER — PREVNAR 20 0.5 ML IM SUSY: 0.5000 mL | PREFILLED_SYRINGE | INTRAMUSCULAR | 0 refills | Status: AC

## 2021-01-07 NOTE — Progress Notes (Signed)
I,Katawbba Wiggins,acting as a Education administrator for Maximino Greenland, MD.,have documented all relevant documentation on the behalf of Maximino Greenland, MD,as directed by  Maximino Greenland, MD while in the presence of Maximino Greenland, MD.  This visit occurred during the SARS-CoV-2 public health emergency.  Safety protocols were in place, including screening questions prior to the visit, additional usage of staff PPE, and extensive cleaning of exam room while observing appropriate contact time as indicated for disinfecting solutions.  Subjective:     Patient ID: Mary Krueger , female    DOB: 01-May-1946 , 75 y.o.   MRN: [id number]   Chief Complaint  Patient presents with  . Hypertension  . Anxiety    HPI  She reports today for BP check and anxiety. She reports compliance with metoprolol. She did not decrease dose of paxil to 55m as discussed at least visit. She reports her anxiety is well controlled with this medication. States she feels fine. She has stopped taking trazodone because she did not feel it was effective.   Hypertension This is a chronic problem. The current episode started more than 1 year ago. The problem has been gradually improving since onset. The problem is controlled. Associated symptoms include anxiety. Pertinent negatives include no blurred vision, chest pain, palpitations or shortness of breath. Past treatments include beta blockers. The current treatment provides moderate improvement.     Past Medical History:  Diagnosis Date  . Anxiety    Chronic  . Breast mass, left 2005   fatty on U/S  . Cataract   . Fibroid 1987   TAH/USO  . Glaucoma   . Hyperlipidemia   . Uses hearing aid    Bilateral   . Vitamin D deficiency      Family History  Problem Relation Age of Onset  . Diabetes Mother   . Prostate cancer Father   . Colon cancer Father   . Diabetes Brother   . Prostate cancer Brother   . Colon polyps Neg Hx   . Esophageal cancer Neg Hx   . Stomach cancer Neg  Hx   . Rectal cancer Neg Hx      Current Outpatient Medications:  .  Alirocumab (PRALUENT) 150 MG/ML SOAJ, Inject 150 mg into the skin every 14 (fourteen) days., Disp: 2 mL, Rfl: 12 .  aspirin EC 81 MG tablet, Take 81 mg by mouth daily., Disp: , Rfl:  .  calcium-vitamin D (OSCAL WITH D) 250-125 MG-UNIT tablet, Take 1 tablet by mouth daily., Disp: , Rfl:  .  latanoprost (XALATAN) 0.005 % ophthalmic solution, , Disp: , Rfl:  .  meloxicam (MOBIC) 15 MG tablet, Take 15 mg by mouth as needed. , Disp: , Rfl:  .  metoprolol succinate (TOPROL-XL) 25 MG 24 hr tablet, TAKE ONE TABLET BY MOUTH EVERY EVENING, Disp: 90 tablet, Rfl: 0 .  PARoxetine (PAXIL) 40 MG tablet, Take 1 tablet (40 mg total) by mouth daily. Take 1 tablet by mouth daily, Disp: 30 tablet, Rfl: 0 .  Vitamin D, Ergocalciferol, (DRISDOL) 1.25 MG (50000 UNIT) CAPS capsule, Take 1 capsule (50,000 Units total) by mouth 2 (two) times a week., Disp: 24 capsule, Rfl: 1 .  Cholecalciferol (VITAMIN D) 125 MCG (5000 UT) CAPS, Take 5,000 Units by mouth daily. (Patient not taking: Reported on 01/07/2021), Disp: , Rfl:    Allergies  Allergen Reactions  . Atorvastatin Other (See Comments)    myalgias  . Rosuvastatin Other (See Comments)    myalgias  Review of Systems  Constitutional: Negative.   Eyes: Negative for blurred vision.  Respiratory: Negative.  Negative for shortness of breath.   Cardiovascular: Negative.  Negative for chest pain and palpitations.  Gastrointestinal: Negative.   Psychiatric/Behavioral: Negative.   All other systems reviewed and are negative.    Today's Vitals   01/07/21 0856  BP: 114/66  Pulse: 71  Temp: 97.9 F (36.6 C)  TempSrc: Oral  Weight: 147 lb (66.7 kg)  Height: _0  (1.626 m)  PainSc: 0-No pain   Body mass index is 25.23 kg/m.  Wt Readings from Last 3 Encounters:  01/07/21 147 lb (66.7 kg)  01/07/21 147 lb (66.7 kg)  11/27/20 146 lb 9.6 oz (66.5 kg)   Objective:  Physical Exam Vitals  and nursing note reviewed.  Constitutional:      Appearance: Normal appearance.  HENT:     Head: Normocephalic and atraumatic.     Nose:     Comments: Masked     Mouth/Throat:     Comments: Masked  Cardiovascular:     Rate and Rhythm: Normal rate and regular rhythm.     Heart sounds: Normal heart sounds.  Pulmonary:     Effort: Pulmonary effort is normal.     Breath sounds: Normal breath sounds.  Musculoskeletal:     Cervical back: Normal range of motion.  Skin:    General: Skin is warm.  Neurological:     General: No focal deficit present.     Mental Status: She is alert.  Psychiatric:        Mood and Affect: Mood normal.        Behavior: Behavior normal.         Assessment And Plan:     1. Essential hypertension Comments: Chronic, well controlled. She will c/w current meds. She is encouraged to avoid adding salt to her foods.  - CMP14+EGFR  2. Generalized anxiety disorder Comments: Chronic, she will c/w paroxetine. Advised to take daily.  - TSH  3. Primary insomnia Comments: Chronic, encouraged to have a bedtime routine. Advised to consider GABA, melatonin supplementation.   4. Overweight with body mass index (BMI) of 25 to 25.9 in adult Comments: Her BMI is acceptable for her demographic. . Advised to aim for at least 150 minutes of exercise per week.  5. Immunization due Comments: She was given pneumovax-23 IM x 1.   She is encouraged to strive for BMI less than 25 to decrease cardiac risk. Advised to aim for at least 150 minutes of exercise per week.   Patient was given opportunity to ask questions. Patient verbalized understanding of the plan and was able to repeat key elements of the plan. All questions were answered to their satisfaction.   I, Maximino Greenland, MD, have reviewed all documentation for this visit. The documentation on 01/07/21 for the exam, diagnosis, procedures, and orders are all accurate and complete.   IF YOU HAVE BEEN REFERRED TO A  SPECIALIST, IT MAY TAKE 1-2 WEEKS TO SCHEDULE/PROCESS THE REFERRAL. IF YOU HAVE NOT HEARD FROM US/SPECIALIST IN TWO WEEKS, PLEASE GIVE [place] A CALL AT [phone].   THE PATIENT IS ENCOURAGED TO PRACTICE SOCIAL DISTANCING DUE TO THE COVID-19 PANDEMIC.

## 2021-01-07 NOTE — Patient Instructions (Signed)

## 2021-01-07 NOTE — Patient Instructions (Signed)
[name] , Thank you for taking time to come for your Medicare Wellness Visit. I appreciate your ongoing commitment to your health goals. Please review the following plan we discussed and let me know if I can assist you in the future.   Screening recommendations/referrals: Colonoscopy: not required Mammogram: scheduled Bone Density: completed 10/23/2018 Recommended yearly ophthalmology/optometry visit for glaucoma screening and checkup Recommended yearly dental visit for hygiene and checkup  Vaccinations: Influenza vaccine: completed 08/11/2020, due 05/04/2021 Pneumococcal vaccine: sent to pharmacy Tdap vaccine: completed 06/21/2017, due 06/22/2027 Shingles vaccine: completed   Covid-19: 03/19/2020, 11/27/2019  Advanced directives: Please bring a copy of your POA (Power of Attorney) and/or Living Will to your next appointment.   Conditions/risks identified: none  Next appointment: Follow up in one year for your annual wellness visit    Preventive Care 65 Years and Older, Female Preventive care refers to lifestyle choices and visits with your health care provider that can promote health and wellness. What does preventive care include?  A yearly physical exam. This is also called an annual well check.  Dental exams once or twice a year.  Routine eye exams. Ask your health care provider how often you should have your eyes checked.  Personal lifestyle choices, including:  Daily care of your teeth and gums.  Regular physical activity.  Eating a healthy diet.  Avoiding tobacco and drug use.  Limiting alcohol use.  Practicing safe sex.  Taking low-dose aspirin every day.  Taking vitamin and mineral supplements as recommended by your health care provider. What happens during an annual well check? The services and screenings done by your health care provider during your annual well check will depend on your age, overall health, lifestyle risk factors, and family history of  disease. Counseling  Your health care provider may ask you questions about your:  Alcohol use.  Tobacco use.  Drug use.  Emotional well-being.  Home and relationship well-being.  Sexual activity.  Eating habits.  History of falls.  Memory and ability to understand (cognition).  Work and work Statistician.  Reproductive health. Screening  You may have the following tests or measurements:  Height, weight, and BMI.  Blood pressure.  Lipid and cholesterol levels. These may be checked every 5 years, or more frequently if you are over 29 years old.  Skin check.  Lung cancer screening. You may have this screening every year starting at age 14 if you have a 30-pack-year history of smoking and currently smoke or have quit within the past 15 years.  Fecal occult blood test (FOBT) of the stool. You may have this test every year starting at age 67.  Flexible sigmoidoscopy or colonoscopy. You may have a sigmoidoscopy every 5 years or a colonoscopy every 10 years starting at age 82.  Hepatitis C blood test.  Hepatitis B blood test.  Sexually transmitted disease (STD) testing.  Diabetes screening. This is done by checking your blood sugar (glucose) after you have not eaten for a while (fasting). You may have this done every 1-3 years.  Bone density scan. This is done to screen for osteoporosis. You may have this done starting at age 61.  Mammogram. This may be done every 1-2 years. Talk to your health care provider about how often you should have regular mammograms. Talk with your health care provider about your test results, treatment options, and if necessary, the need for more tests. Vaccines  Your health care provider may recommend certain vaccines, such as:  Influenza vaccine.  This is recommended every year.  Tetanus, diphtheria, and acellular pertussis (Tdap, Td) vaccine. You may need a Td booster every 10 years.  Zoster vaccine. You may need this after age  40.  Pneumococcal 13-valent conjugate (PCV13) vaccine. One dose is recommended after age 73.  Pneumococcal polysaccharide (PPSV23) vaccine. One dose is recommended after age 31. Talk to your health care provider about which screenings and vaccines you need and how often you need them. This information is not intended to replace advice given to you by your health care provider. Make sure you discuss any questions you have with your health care provider. Document Released: 10/17/2015 Document Revised: 06/09/2016 Document Reviewed: 07/22/2015 Elsevier Interactive Patient Education  2017 [place] Prevention in the Home Falls can cause injuries. They can happen to people of all ages. There are many things you can do to make your home safe and to help prevent falls. What can I do on the outside of my home?  Regularly fix the edges of walkways and driveways and fix any cracks.  Remove anything that might make you trip as you walk through a door, such as a raised step or threshold.  Trim any bushes or trees on the path to your home.  Use bright outdoor lighting.  Clear any walking paths of anything that might make someone trip, such as rocks or tools.  Regularly check to see if handrails are loose or broken. Make sure that both sides of any steps have handrails.  Any raised decks and porches should have guardrails on the edges.  Have any leaves, snow, or ice cleared regularly.  Use sand or salt on walking paths during winter.  Clean up any spills in your garage right away. This includes oil or grease spills. What can I do in the bathroom?  Use night lights.  Install grab bars by the toilet and in the tub and shower. Do not use towel bars as grab bars.  Use non-skid mats or decals in the tub or shower.  If you need to sit down in the shower, use a plastic, non-slip stool.  Keep the floor dry. Clean up any water that spills on the floor as soon as it happens.  Remove  soap buildup in the tub or shower regularly.  Attach bath mats securely with double-sided non-slip rug tape.  Do not have throw rugs and other things on the floor that can make you trip. What can I do in the bedroom?  Use night lights.  Make sure that you have a light by your bed that is easy to reach.  Do not use any sheets or blankets that are too big for your bed. They should not hang down onto the floor.  Have a firm chair that has side arms. You can use this for support while you get dressed.  Do not have throw rugs and other things on the floor that can make you trip. What can I do in the kitchen?  Clean up any spills right away.  Avoid walking on wet floors.  Keep items that you use a lot in easy-to-reach places.  If you need to reach something above you, use a strong step stool that has a grab bar.  Keep electrical cords out of the way.  Do not use floor polish or wax that makes floors slippery. If you must use wax, use non-skid floor wax.  Do not have throw rugs and other things on the floor that can make  you trip. What can I do with my stairs?  Do not leave any items on the stairs.  Make sure that there are handrails on both sides of the stairs and use them. Fix handrails that are broken or loose. Make sure that handrails are as long as the stairways.  Check any carpeting to make sure that it is firmly attached to the stairs. Fix any carpet that is loose or worn.  Avoid having throw rugs at the top or bottom of the stairs. If you do have throw rugs, attach them to the floor with carpet tape.  Make sure that you have a light switch at the top of the stairs and the bottom of the stairs. If you do not have them, ask someone to add them for you. What else can I do to help prevent falls?  Wear shoes that:  Do not have high heels.  Have rubber bottoms.  Are comfortable and fit you well.  Are closed at the toe. Do not wear sandals.  If you use a  stepladder:  Make sure that it is fully opened. Do not climb a closed stepladder.  Make sure that both sides of the stepladder are locked into place.  Ask someone to hold it for you, if possible.  Clearly mark and make sure that you can see:  Any grab bars or handrails.  First and last steps.  Where the edge of each step is.  Use tools that help you move around (mobility aids) if they are needed. These include:  Canes.  Walkers.  Scooters.  Crutches.  Turn on the lights when you go into a dark area. Replace any light bulbs as soon as they burn out.  Set up your furniture so you have a clear path. Avoid moving your furniture around.  If any of your floors are uneven, fix them.  If there are any pets around you, be aware of where they are.  Review your medicines with your doctor. Some medicines can make you feel dizzy. This can increase your chance of falling. Ask your doctor what other things that you can do to help prevent falls. This information is not intended to replace advice given to you by your health care provider. Make sure you discuss any questions you have with your health care provider. Document Released: 07/17/2009 Document Revised: 02/26/2016 Document Reviewed: 10/25/2014 Elsevier Interactive Patient Education  2017 [employer].

## 2021-01-07 NOTE — Progress Notes (Signed)
This visit occurred during the SARS-CoV-2 public health emergency.  Safety protocols were in place, including screening questions prior to the visit, additional usage of staff PPE, and extensive cleaning of exam room while observing appropriate contact time as indicated for disinfecting solutions.  Subjective:   Mary Krueger is a 75 y.o. female who presents for Medicare Annual (Subsequent) preventive examination.  Review of Systems     Cardiac Risk Factors include: advanced age (>48mn, >>72women);dyslipidemia;hypertension     Objective:    Today's Vitals   01/07/21 0932  BP: 114/66  Pulse: 71  Temp: 97.9 F (36.6 C)  TempSrc: Oral  Weight: 147 lb (66.7 kg)  Height: 5' 4"  (1.626 m)   Body mass index is 25.23 kg/m.  Advanced Directives 01/07/2021 12/26/2019 03/02/2019 12/14/2018  Does Patient Have a Medical Advance Directive? Yes Yes Yes Yes  Type of [place] will [place] will Living will Living will  Does patient want to make changes to medical advance directive? - - - No - Patient declined  Copy of HCarverin Chart? No - copy requested No - copy requested No - copy requested -    Current Medications (verified) Outpatient Encounter Medications as of 01/07/2021  Medication Sig  . Alirocumab (PRALUENT) 150 MG/ML SOAJ Inject 150 mg into the skin every 14 (fourteen) days.  .Marland Kitchenaspirin EC 81 MG tablet Take 81 mg by mouth daily.  . calcium-vitamin D (OSCAL WITH D) 250-125 MG-UNIT tablet Take 1 tablet by mouth daily.  . Cholecalciferol (VITAMIN D) 125 MCG (5000 UT) CAPS Take 5,000 Units by mouth daily. (Patient not taking: Reported on 01/07/2021)  . latanoprost (XALATAN) 0.005 % ophthalmic solution   . meloxicam (MOBIC) 15 MG tablet Take 15 mg by mouth as needed.   . metoprolol succinate (TOPROL-XL) 25 MG 24 hr tablet TAKE ONE TABLET BY MOUTH EVERY EVENING  . PARoxetine (PAXIL) 40 MG tablet  Take 1 tablet (40 mg total) by mouth daily. Take 1 tablet by mouth daily  . Vitamin D, Ergocalciferol, (DRISDOL) 1.25 MG (50000 UNIT) CAPS capsule Take 1 capsule (50,000 Units total) by mouth 2 (two) times a week.   No facility-administered encounter medications on file as of 01/07/2021.    Allergies (verified) Atorvastatin and Rosuvastatin   History: Past Medical History:  Diagnosis Date  . Anxiety    Chronic  . Breast mass, left 2005   fatty on U/S  . Cataract   . Fibroid 1987   TAH/USO  . Glaucoma   . Hyperlipidemia   . Uses hearing aid    Bilateral   . Vitamin D deficiency    Past Surgical History:  Procedure Laterality Date  . BALLOON DILATION N/A 03/05/2019   Procedure: BALLOON DILATION;  Surgeon: NMauri Pole MD;  Location: [facility];  Service: Endoscopy;  Laterality: N/A;  TTS balloon dilation  . CATARACT EXTRACTION W/ INTRAOCULAR LENS IMPLANT Bilateral 12/2015 01/2016  . COSMETIC SURGERY     Mini face lift  . ESOPHAGEAL MANOMETRY N/A 03/05/2019   Procedure: ESOPHAGEAL MANOMETRY (EM);  Surgeon: NMauri Pole MD;  Location: [facility];  Service: Endoscopy;  Laterality: N/A;  . ESOPHAGOGASTRODUODENOSCOPY  01/2019  . ESOPHAGOGASTRODUODENOSCOPY (EGD) WITH PROPOFOL N/A 01/17/2019   Procedure: ESOPHAGOGASTRODUODENOSCOPY (EGD) WITH PROPOFOL;  Surgeon: PJerene Bears MD;  [place]  Service: Gastroenterology;  Laterality: N/A;  . ESOPHAGOGASTRODUODENOSCOPY (EGD) WITH PROPOFOL N/A 03/05/2019   Procedure: ESOPHAGOGASTRODUODENOSCOPY (EGD) WITH  PROPOFOL;  Surgeon: Mauri Pole, MD;  Location: [facility];  Service: Endoscopy;  Laterality: N/A;  . EYE SURGERY  06/2012   eye lift  . FOREIGN BODY REMOVAL  01/17/2019   Procedure: FOREIGN BODY REMOVAL;  Surgeon: Jerene Bears, MD;  Location: [facility] ENDOSCOPY;  Service: Gastroenterology;;  . LIPOMA EXCISION Left 2009   left arm  . STOMACH SURGERY  1997   Sphincter  . TONSILLECTOMY AND ADENOIDECTOMY  1972   . TOTAL ABDOMINAL HYSTERECTOMY W/ BILATERAL SALPINGOOPHORECTOMY    . TRIGGER FINGER RELEASE     right   Family History  Problem Relation Age of Onset  . Diabetes Mother   . Prostate cancer Father   . Colon cancer Father   . Diabetes Brother   . Prostate cancer Brother   . Colon polyps Neg Hx   . Esophageal cancer Neg Hx   . Stomach cancer Neg Hx   . Rectal cancer Neg Hx    Social History   Socioeconomic History  . Marital status: Divorced    Spouse name: Not on file  . Number of children: Not on file  . Years of education: Not on file  . Highest education level: Not on file  Occupational History  . Occupation: retired  Tobacco Use  . Smoking status: Former Smoker    Packs/day: 1.50    Years: 18.00    Pack years: 27.00    Start date: [place] date: 1981    Years since quitting: 41.2  . Smokeless tobacco: Never Used  . Tobacco comment: quit in 1981  Vaping Use  . Vaping Use: Never used  Substance and Sexual Activity  . Alcohol use: No    Alcohol/week: 0.0 standard drinks  . Drug use: No  . Sexual activity: Not Currently    Partners: Male    Birth control/protection: Post-menopausal, Surgical  Other Topics Concern  . Not on file  Social History Narrative   Lives alone.     Social Determinants of Health   Financial Resource Strain: Low Risk   . Difficulty of Paying Living Expenses: Not hard at all  Food Insecurity: No Food Insecurity  . Worried About Charity fundraiser in the Last Year: Never true  . Ran Out of Food in the Last Year: Never true  Transportation Needs: No Transportation Needs  . Lack of Transportation (Medical): No  . Lack of Transportation (Non-Medical): No  Physical Activity: Sufficiently Active  . Days of Exercise per Week: 3 days  . Minutes of Exercise per Session: 60 min  Stress: No Stress Concern Present  . Feeling of Stress : Not at all  Social Connections: Not on file    Tobacco Counseling Counseling given: Not  Answered Comment: quit in 1981   Clinical Intake:  Pre-visit preparation completed: Yes  Pain : No/denies pain     Nutritional Status: BMI 25 -29 Overweight Nutritional Risks: None Diabetes: No  How often do you need to have someone help you when you read instructions, pamphlets, or other written materials from your doctor or pharmacy?: 1 - Never What is the last grade level you completed in school?: associates degree  Diabetic? no  Interpreter Needed?: No  Information entered by :: NAllen LPN   Activities of Daily Living In your present state of health, do you have any difficulty performing the following activities: 01/07/2021 01/07/2021  Hearing? Y Y  Comment hearing aide -  Vision? N N  Difficulty concentrating  or making decisions? N N  Walking or climbing stairs? N N  Dressing or bathing? N N  Doing errands, shopping? N N  Preparing Food and eating ? N -  Using the Toilet? N -  In the past six months, have you accidently leaked urine? N -  Do you have problems with loss of bowel control? N -  Managing your Medications? N -  Managing your Finances? N -  Housekeeping or managing your Housekeeping? N -  Some recent data might be hidden    Patient Care Team: Glendale Chard, MD as PCP - General (Internal Medicine) Caud[place], Kennieth Francois, [facility] (Inactive) (Pharmacist)  Indicate any recent Medical Services you may have received from other than [facility] providers in the past year (date may be approximate).     Assessment:   This is a routine wellness examination for [place].  Hearing/Vision screen No exam data present  Dietary issues and exercise activities discussed: Current Exercise Habits: Home exercise routine, Type of exercise: walking, Time (Minutes): 60, Frequency (Times/Week): 3, Weekly Exercise (Minutes/Week): 180  Goals    . Patient Stated     12/26/2019, wants to start swimming    . Patient Stated     01/07/2021, no goals    . Pharmacy Care Plan     CARE PLAN  ENTRY (see longitudinal plan of care for additional care plan information)  Current Barriers:  . Chronic Disease Management support, education, and care coordination needs related to Hypertension, Hyperlipidemia, and Anxiety   Hypertension BP Readings from Last 3 Encounters:  12/26/19 132/70  12/26/19 132/70  12/11/19 136/74   . Pharmacist Clinical Goal(s): o Over the next 90 days, patient will work with PharmD and providers to achieve BP goal <130/80 . Current regimen:  o Metoprolol succinate 58m daily every evening . Interventions: o Provided dietary and exercise recommendations o Provided patient education about the importance of continuing blood pressure medication . Patient self care activities - Over the next 90 days, patient will: o Check BP once weekly, document, and provide at future appointments o Ensure daily salt intake < 2300 mg/day o Choose frozen foods that are low in salt content o Try to exercise for 30 minutes a day 5 times per week o Continue metoprolol daily as directed  Hyperlipidemia Lab Results  Component Value Date/Time   LDLCALC 214 (H) 06/21/2019 12:01 PM   LDLDIRECT 166.6 10/24/2008 08:27 AM   . Pharmacist Clinical Goal(s): o Over the next 90 days, patient will work with PharmD and providers to achieve LDL goal < 70 . Current regimen:  o Praluent 1525mevery 14 days . Interventions: o Provided dietary and exercise recommendations o Discussed patient is currently receiving assistance through a grant and Praluent copay is $0. Advised patient there may be alternative options if [employer] grant runs out . Patient self care activities - Over the next 90 days, patient will: o Follow up with Cardiology (appointment scheduled 9/16)  Anxiety . Pharmacist Clinical Goal(s) o Over the next 180 days, patient will work with PharmD and providers to manage symptoms of anxiety . Current regimen:  o Paroxetine 4057maily  . Interventions: o Reviewed most  recent office visit notes from PCP and it was discussed decreasing patient to paroxetine 62m40mily after she finished her current fill of paroxetine 40mg13mollaborate with PCP to have a prescription or paroxetine 62mg 34my sent in . Patient self care activities - Over the next 180 days, patient will: o Decreased to  paroxetine 75m daily after finishing current fill of paroxetine 444m(about 20 tablets remain)  Osteopenia  . Pharmacist Clinical Goal(s) o Over the next 180 days, patient will work with PharmD and providers to strengthen bones and reduce the risk of fractures . Current regimen:  o Cholecalciferol 5000 units daily . Interventions: o Recommend patient start calcium supplement 120054maily o Recommend patient start weight-bearing exercises 3 days per week . Patient self care activities - Over the next 180 days, patient will: o Start taking 1200m4m calcium daily o Start performing weight bearing exercises 3 times weekly  Medication management . Pharmacist Clinical Goal(s): o Over the next 180 days, patient will work with PharmD and providers to maintain optimal medication adherence . [facility] o Comprehensive medication review performed. o Continue current medication management strategy . Patient self care activities - Over the next 180 days, patient will: o Take medications as prescribed o Report any questions or concerns to PharmD and/or provider(s)  Initial goal documentation       Depression Screen PHQ 2/9 Scores 01/07/2021 11/27/2020 12/26/2019 06/21/2019 12/14/2018  PHQ - 2 Score 0 0 0 0 3  PHQ- 9 Score - - 0 - 4    Fall Risk Fall Risk  01/07/2021 01/07/2021 12/26/2019 06/21/2019 12/14/2018  Falls in the past year? 1 1 0 1 0  Comment dog knocked over - - - -  Number falls in past yr: 0 0 - 1 -  Injury with Fall? 0 0 - 0 -  Comment - tripped over the dog - - -  Risk for fall due to : Medication side effect - Medication  side effect - Medication side effect  Follow up Falls evaluation completed;Education provided;Falls prevention discussed - Falls evaluation completed;Education provided;Falls prevention discussed - Falls prevention discussed    FALL RISK PREVENTION PERTAINING TO THE HOME:  Any stairs in or around the home? Yes  If so, are there any without handrails? No  Home free of loose throw rugs in walkways, pet beds, electrical cords, etc? Yes  Adequate lighting in your home to reduce risk of falls? Yes   ASSISTIVE DEVICES UTILIZED TO PREVENT FALLS:  Life alert? No  Use of a cane, walker or w/c? No  Grab bars in the bathroom? Yes  Shower chair or bench in shower? No  Elevated toilet seat or a handicapped toilet? Yes   TIMED UP AND GO:  Was the test performed? No . .   Gait steady and fast without use of assistive device  Cognitive Function:     6CIT Screen 01/07/2021 12/26/2019  What Year? 0 points 0 points  What month? 0 points 0 points  What time? 0 points 0 points  Count back from 20 0 points 0 points  Months in reverse 0 points 0 points  Repeat phrase 0 points 0 points  Total Score 0 0    Immunizations Immunization History  Administered Date(s) Administered  . Fluad Quad(high Dose 65+) 08/11/2020  . Hep A / Hep B 08/23/2017  . Hepatitis A 06/21/2017, 08/23/2017  . Hepatitis B 06/21/2017, 08/23/2017  . Influenza, High Dose Seasonal PF 06/21/2019  . Influenza-Unspecified 07/30/2018  . MMR 06/21/2017  . PFIZER(Purple Top)SARS-COV-2 Vaccination 11/27/2019, 03/19/2020  . Tdap 06/21/2017  . Zoster Recombinat (Shingrix) 07/22/2017    TDAP status: Up to date  Flu Vaccine status: Up to date  Pneumococcal vaccine status: sent to pharmacy  Covid-19 vaccine status: Completed vaccines  Qualifies  for Shingles Vaccine? Yes   Zostavax completed No   Shingrix Completed?: Yes  Screening Tests Health Maintenance  Topic Date Due  . PNA vac Low Risk Adult (1 of 2 - PCV13) Never  done  . COVID-19 Vaccine (3 - Booster for Pfizer series) 01/23/2021 (Originally 09/18/2020)  . INFLUENZA VACCINE  05/04/2021  . TETANUS/TDAP  06/22/2027  . DEXA SCAN  Completed  . Hepatitis C Screening  Completed  . HPV VACCINES  Aged Out    Health Maintenance  Health Maintenance Due  Topic Date Due  . PNA vac Low Risk Adult (1 of 2 - PCV13) Never done    Colorectal cancer screening: No longer required.   Mammogram status: scheduled for next week  Bone Density status: Completed 10/23/2018.   Lung Cancer Screening: (Low Dose CT Chest recommended if Age 82-80 years, 30 pack-year currently smoking OR have quit w/in 15years.) does not qualify.   Lung Cancer Screening Referral: no  Additional Screening:  Hepatitis C Screening: does qualify; Completed 03/19/2013  Vision Screening: Recommended annual ophthalmology exams for early detection of glaucoma and other disorders of the eye. Is the patient up to date with their annual eye exam?  Yes  Who is the provider or what is the name of the office in which the patient attends annual eye exams? Dr. Sabra Heck If pt is not established with a provider, would they like to be referred to a provider to establish care? No .   Dental Screening: Recommended annual dental exams for proper oral hygiene  Community Resource Referral / Chronic Care Management: CRR required this visit?  No   CCM required this visit?  No      Plan:     I have personally reviewed and noted the following in the patient's chart:   . Medical and social history . Use of alcohol, tobacco or illicit drugs  . Current medications and supplements . Functional ability and status . Nutritional status . Physical activity . Advanced directives . List of other physicians . Hospitalizations, surgeries, and ER visits in previous 12 months . Vitals . Screenings to include cognitive, depression, and falls . Referrals and appointments  In addition, I have reviewed and  discussed with patient certain preventive protocols, quality metrics, and best practice recommendations. A written personalized care plan for preventive services as well as general preventive health recommendations were provided to patient.     Kellie Simmering, LPN   06/06/5700   Nurse Notes:

## 2021-01-08 LAB — CMP14+EGFR
ALT: 15 IU/L (ref 0–32)
AST: 20 IU/L (ref 0–40)
Albumin/Globulin Ratio: 2.2 (ref 1.2–2.2)
Albumin: 4.3 g/dL (ref 3.7–4.7)
Alkaline Phosphatase: 76 IU/L (ref 44–121)
BUN/Creatinine Ratio: 21 (ref 12–28)
BUN: 18 mg/dL (ref 8–27)
Bilirubin Total: <0.2 mg/dL (ref 0.0–1.2)
CO2: 23 mmol/L (ref 20–29)
Calcium: 9.1 mg/dL (ref 8.7–10.3)
Chloride: 104 mmol/L (ref 96–106)
Creatinine, Ser: 0.86 mg/dL (ref 0.57–1.00)
Globulin, Total: 2 g/dL (ref 1.5–4.5)
Glucose: 89 mg/dL (ref 65–99)
Potassium: 4.9 mmol/L (ref 3.5–5.2)
Sodium: 145 mmol/L — ABNORMAL HIGH (ref 134–144)
Total Protein: 6.3 g/dL (ref 6.0–8.5)
eGFR: 70 mL/min/{1.73_m2} (ref >59)

## 2021-01-08 LAB — TSH: TSH: 2.75 u[IU]/mL (ref 0.450–4.500)

## 2021-01-21 ENCOUNTER — Telehealth: Payer: Self-pay

## 2021-01-21 NOTE — Chronic Care Management (AMB) (Signed)
Chronic Care Management Pharmacy Assistant   Name: Maximina Pirozzi  MRN: [id number] DOB: 24-Mar-1946   Reason for Encounter: Disease State-Hypertension    Recent office visits:  01/07/21-Robyn Baird Cancer, MD (PCP)   Recent consult visits:  02/24/22[facility] Kidney s. Sabra Heck, MD (OBGYN) 10/30/20- Remo Lipps L. Sabra Heck [facility] visits:  None in previous 6 months  Medications: Outpatient Encounter Medications as of 01/21/2021  Medication Sig  . Alirocumab (PRALUENT) 150 MG/ML SOAJ Inject 150 mg into the skin every 14 (fourteen) days.  Marland Kitchen aspirin EC 81 MG tablet Take 81 mg by mouth daily.  . calcium-vitamin D (OSCAL WITH D) 250-125 MG-UNIT tablet Take 1 tablet by mouth daily.  . Cholecalciferol (VITAMIN D) 125 MCG (5000 UT) CAPS Take 5,000 Units by mouth daily. (Patient not taking: Reported on 01/07/2021)  . latanoprost (XALATAN) 0.005 % ophthalmic solution   . meloxicam (MOBIC) 15 MG tablet Take 15 mg by mouth as needed.   . metoprolol succinate (TOPROL-XL) 25 MG 24 hr tablet TAKE ONE TABLET BY MOUTH EVERY EVENING  . PARoxetine (PAXIL) 40 MG tablet Take 1 tablet (40 mg total) by mouth daily. Take 1 tablet by mouth daily  . Vitamin D, Ergocalciferol, (DRISDOL) 1.25 MG (50000 UNIT) CAPS capsule Take 1 capsule (50,000 Units total) by mouth 2 (two) times a week.   No facility-administered encounter medications on file as of 01/21/2021.    Reviewed chart prior to disease state call. Spoke with patient regarding BP  Recent Office Vitals: BP Readings from Last 3 Encounters:  01/07/21 114/66  01/07/21 114/66  11/27/20 132/67   Pulse Readings from Last 3 Encounters:  01/07/21 71  01/07/21 71  11/27/20 74    Wt Readings from Last 3 Encounters:  01/07/21 147 lb (66.7 kg)  01/07/21 147 lb (66.7 kg)  11/27/20 146 lb 9.6 oz (66.5 kg)     Kidney Function Lab Results  Component Value Date/Time   CREATININE 0.86 01/07/2021 09:46 AM   CREATININE 0.87 06/19/2020 09:21 AM   GFRNONAA  65 06/19/2020 09:21 AM   GFRAA 75 06/19/2020 09:21 AM    BMP Latest Ref Rng & Units 01/07/2021 06/19/2020 12/26/2019  Glucose 65 - 99 mg/dL 89 91 90  BUN 8 - 27 mg/dL 18 18 17   Creatinine 0.57 - 1.00 mg/dL 0.86 0.87 0.87  BUN/Creat Ratio 12 - 28 21 21 20   Sodium 134 - 144 mmol/L 145(H) 142 142  Potassium 3.5 - 5.2 mmol/L 4.9 4.6 4.4  Chloride 96 - 106 mmol/L 104 105 102  CO2 20 - 29 mmol/L 23 28 26   Calcium 8.7 - 10.3 mg/dL 9.1 9.1 9.6    . Current antihypertensive regimen:  o Metoprolol Succinate 25 mg Take 1 tablet every evening  . How often are you checking your Blood Pressure? Patient states she checks her blood pressure about once a month.  . Current home BP readings: 110/70 on average  . What recent interventions/DTPs have been made by any provider to improve Blood Pressure control since last CPP Visit: None noted  . Any recent hospitalizations or ED visits since last visit with CPP? No   . What diet changes have been made to improve Blood Pressure Control?  o Patient states she eats less salt and has increased her water intake.  . What exercise is being done to improve your Blood Pressure Control?  o Patient states she walks about 3 times per week.  Adherence Review: Is the patient currently on ACE/ARB medication?  No Does the patient have >5 day gap between last estimated fill dates? No    Star Rating Drugs: None noted  [place] Pharmacist Assistant [phone]

## 2021-01-24 ENCOUNTER — Other Ambulatory Visit: Payer: Self-pay | Admitting: Internal Medicine

## 2021-01-25 DIAGNOSIS — Z833 Family history of diabetes mellitus: Secondary | ICD-10-CM | POA: Diagnosis not present

## 2021-01-25 DIAGNOSIS — Z7722 Contact with and (suspected) exposure to environmental tobacco smoke (acute) (chronic): Secondary | ICD-10-CM | POA: Diagnosis not present

## 2021-01-25 DIAGNOSIS — M1990 Unspecified osteoarthritis, unspecified site: Secondary | ICD-10-CM | POA: Diagnosis not present

## 2021-01-25 DIAGNOSIS — Z87891 Personal history of nicotine dependence: Secondary | ICD-10-CM | POA: Diagnosis not present

## 2021-01-25 DIAGNOSIS — I10 Essential (primary) hypertension: Secondary | ICD-10-CM | POA: Diagnosis not present

## 2021-01-25 DIAGNOSIS — I739 Peripheral vascular disease, unspecified: Secondary | ICD-10-CM | POA: Diagnosis not present

## 2021-01-25 DIAGNOSIS — Z008 Encounter for other general examination: Secondary | ICD-10-CM | POA: Diagnosis not present

## 2021-01-25 DIAGNOSIS — E785 Hyperlipidemia, unspecified: Secondary | ICD-10-CM | POA: Diagnosis not present

## 2021-01-25 DIAGNOSIS — H409 Unspecified glaucoma: Secondary | ICD-10-CM | POA: Diagnosis not present

## 2021-01-25 DIAGNOSIS — R69 Illness, unspecified: Secondary | ICD-10-CM | POA: Diagnosis not present

## 2021-02-01 ENCOUNTER — Other Ambulatory Visit: Payer: Self-pay | Admitting: Internal Medicine

## 2021-02-04 ENCOUNTER — Other Ambulatory Visit (HOSPITAL_BASED_OUTPATIENT_CLINIC_OR_DEPARTMENT_OTHER): Payer: Self-pay | Admitting: Obstetrics & Gynecology

## 2021-02-09 ENCOUNTER — Ambulatory Visit: Payer: Medicare HMO | Admitting: Internal Medicine

## 2021-02-09 ENCOUNTER — Telehealth: Payer: Self-pay

## 2021-02-09 NOTE — Chronic Care Management (AMB) (Signed)
Chronic Care Management Pharmacy Assistant   Name: Mary Krueger  MRN: [id number] DOB: 1945-10-17   Reason for Encounter: Disease State/ Hypertension    Recent office visits: 01-07-2021 Glendale Chard, MD. STOP trazodone 50mg  (Patient reported not taking)   Recent consult visits:  10-30-2020 Nicola Girt (Optometry)  11-27-2020 Megan Salon, MD (Gynecology) START trazodone 50mg  half tablet every night as needed for insomnia.   Hospital visits:  None in previous 6 months  Medications: Outpatient Encounter Medications as of 02/09/2021  Medication Sig  . Alirocumab (PRALUENT) 150 MG/ML SOAJ Inject 150 mg into the skin every 14 (fourteen) days.  Marland Kitchen aspirin EC 81 MG tablet Take 81 mg by mouth daily.  . calcium-vitamin D (OSCAL WITH D) 250-125 MG-UNIT tablet Take 1 tablet by mouth daily.  . Cholecalciferol (VITAMIN D) 125 MCG (5000 UT) CAPS Take 5,000 Units by mouth daily. (Patient not taking: Reported on 01/07/2021)  . latanoprost (XALATAN) 0.005 % ophthalmic solution   . meloxicam (MOBIC) 15 MG tablet Take 15 mg by mouth as needed.   . metoprolol succinate (TOPROL-XL) 25 MG 24 hr tablet TAKE ONE TABLET BY MOUTH EVERY EVENING  . PARoxetine (PAXIL) 40 MG tablet TAKE ONE TABLET BY MOUTH DAILY  . Vitamin D, Ergocalciferol, (DRISDOL) 1.25 MG (50000 UNIT) CAPS capsule TAKE 1 CAPSULE BY MOUTH 2 TIMES PER WEEK   No facility-administered encounter medications on file as of 02/09/2021.   Reviewed chart prior to disease state call. Spoke with patient regarding BP  Recent Office Vitals: BP Readings from Last 3 Encounters:  01/07/21 114/66  01/07/21 114/66  11/27/20 132/67   Pulse Readings from Last 3 Encounters:  01/07/21 71  01/07/21 71  11/27/20 74    Wt Readings from Last 3 Encounters:  01/07/21 147 lb (66.7 kg)  01/07/21 147 lb (66.7 kg)  11/27/20 146 lb 9.6 oz (66.5 kg)     Kidney Function Lab Results  Component Value Date/Time   CREATININE 0.86 01/07/2021  09:46 AM   CREATININE 0.87 06/19/2020 09:21 AM   GFRNONAA 65 06/19/2020 09:21 AM   GFRAA 75 06/19/2020 09:21 AM    BMP Latest Ref Rng & Units 01/07/2021 06/19/2020 12/26/2019  Glucose 65 - 99 mg/dL 89 91 90  BUN 8 - 27 mg/dL 18 18 17   Creatinine 0.57 - 1.00 mg/dL 0.86 0.87 0.87  BUN/Creat Ratio 12 - 28 21 21 20   Sodium 134 - 144 mmol/L 145(H) 142 142  Potassium 3.5 - 5.2 mmol/L 4.9 4.6 4.4  Chloride 96 - 106 mmol/L 104 105 102  CO2 20 - 29 mmol/L 23 28 26   Calcium 8.7 - 10.3 mg/dL 9.1 9.1 9.6    . Current antihypertensive regimen:  ? Metoprolol Succinate 25 mg Take 1 tablet every evening  . How often are you checking your Blood Pressure? Patient states she checks blood pressure once a month.  . Current home BP readings: 113/70  . What recent interventions/DTPs have been made by any provider to improve Blood Pressure control since last CPP Visit: Patient states she's limited her salt intake.  . Any recent hospitalizations or ED visits since last visit with CPP? No   . What diet changes have been made to improve Blood Pressure Control?  ? Patient states she eats less salt and has increased her water intake.  . What exercise is being done to improve your Blood Pressure Control?  ? Patient states she walks about 3 times per week.  Adherence  Review: Is the patient currently on ACE/ARB medication? No Does the patient have >5 day gap between last estimated fill dates? No   Star Rating Drugs: None noted  [place]  [phone]

## 2021-02-13 ENCOUNTER — Ambulatory Visit: Payer: Medicare HMO

## 2021-02-13 ENCOUNTER — Other Ambulatory Visit: Payer: Self-pay | Admitting: Cardiovascular Disease

## 2021-02-16 ENCOUNTER — Other Ambulatory Visit: Payer: Self-pay

## 2021-02-16 ENCOUNTER — Ambulatory Visit
Admission: RE | Admit: 2021-02-16 | Discharge: 2021-02-16 | Disposition: A | Discharge status: Home / Self Care | Payer: Medicare HMO | Source: Ambulatory Visit | Attending: Physical Medicine and Rehabilitation | Admitting: Physical Medicine and Rehabilitation

## 2021-02-16 DIAGNOSIS — Z1231 Encounter for screening mammogram for malignant neoplasm of breast: Secondary | ICD-10-CM

## 2021-02-24 ENCOUNTER — Telehealth: Payer: Self-pay

## 2021-02-24 ENCOUNTER — Encounter: Payer: Self-pay | Admitting: Internal Medicine

## 2021-02-24 NOTE — Telephone Encounter (Signed)
Returned the pt's call and left her a message that I was calling to schedule her an appt with Dr. Baird Cancer to discuss the pt's PAD results and that Dr. Baird Cancer also has the results.

## 2021-02-25 ENCOUNTER — Telehealth: Payer: Self-pay | Admitting: Internal Medicine

## 2021-02-25 NOTE — Telephone Encounter (Signed)
Called pt to schedule a OV (PAD) appt on Dr.Sanders schedule for the week of May 30th no answer left VM

## 2021-04-10 ENCOUNTER — Ambulatory Visit (INDEPENDENT_AMBULATORY_CARE_PROVIDER_SITE_OTHER): Payer: Medicare HMO | Admitting: Family

## 2021-04-10 ENCOUNTER — Encounter (HOSPITAL_BASED_OUTPATIENT_CLINIC_OR_DEPARTMENT_OTHER): Payer: Self-pay | Admitting: Family

## 2021-04-10 ENCOUNTER — Other Ambulatory Visit: Payer: Self-pay

## 2021-04-10 VITALS — BP 100/50 | HR 77 | Ht 64.0 in | Wt 145.2 lb

## 2021-04-10 DIAGNOSIS — I739 Peripheral vascular disease, unspecified: Secondary | ICD-10-CM | POA: Diagnosis not present

## 2021-04-10 DIAGNOSIS — E782 Mixed hyperlipidemia: Secondary | ICD-10-CM

## 2021-04-10 DIAGNOSIS — I10 Essential (primary) hypertension: Secondary | ICD-10-CM

## 2021-04-10 NOTE — Patient Instructions (Signed)
Medication Instructions:  Continue your current medications  *If you need a refill on your cardiac medications before your next appointment, please call your pharmacy*   Lab Work: None ordered today   Your cholesterol in June with your primary care provider looked great!   Testing/Procedures: Your physician has requested that you have an ankle brachial index (ABI) in June 2023.  During this test an ultrasound and blood pressure cuff are used to evaluate the arteries that supply the arms and legs with blood. Allow thirty minutes for this exam. There are no restrictions or special instructions. We will have you see one of our peripheral arteria disease specialists after this study to establish care.    Follow-Up: At [facility], you and your health needs are our priority.  As part of our continuing mission to provide you with exceptional heart care, we have created designated Provider Care Teams.  These Care Teams include your primary Cardiologist (physician) and Advanced Practice Providers (APPs -  Physician Assistants and Nurse Practitioners) who all work together to provide you with the care you need, when you need it.  We recommend signing up for the patient portal called "MyChart".  Sign up information is provided on this After Visit Summary.  MyChart is used to connect with patients for Virtual Visits (Telemedicine).  Patients are able to view lab/test results, encounter notes, upcoming appointments, etc.  Non-urgent messages can be sent to your provider as well.   To learn more about what you can do with MyChart, go to [email or web address].    Your next appointment:   1 year(s)  The format for your next appointment:   In Person  Provider:   Skeet Latch, MD   Other Instructions  Peripheral Vascular Disease  Peripheral vascular disease (PVD) is a disease of the blood vessels that carry blood from the heart to the rest of the body. PVD is also called peripheral artery  disease (PAD) or poor circulation. PVD affects most of the body. But itaffects the legs and feet the most. PVD can lead to acute limb ischemia. This happens when there is a sudden stopof blood flow to an arm or leg. This is a medical emergency. What are the causes? The most common cause of PVD is a buildup of a fatty substance (plaque) inside your arteries. This decreases blood flow. Plaque can break off andblock blood in a smaller artery. This can lead to acute limb ischemia. Other common causes of PVD include: Blood clots inside the blood vessels. Injuries to blood vessels. Irritation and swelling of blood vessels. Sudden tightening of the blood vessel (spasms). What increases the risk? A family history of PVD. Medical conditions, including: High cholesterol. Diabetes. High blood pressure. Heart disease. Past problems with blood clots. Past injury, such as burns or a broken bone. Other conditions, such as: Buerger's disease. This is caused by swollen or irritated blood vessels in your hands and feet. Arthritis. Birth defects that affect the arteries in your legs. Kidney disease. Using tobacco or nicotine products. Not getting enough exercise. Being very overweight (obese). Being 70 years old or older. What are the signs or symptoms? Cramps in your butt, legs, and feet. Pain and weakness in your legs when you are active that goes away when you rest. Leg pain when at rest. Leg numbness, tingling, or weakness. Coldness in a leg or foot, especially when compared with the other leg or foot. Skin or hair changes. These can include: Hair loss. Shiny skin. Pale or  bluish skin. Thick toenails. Being unable to get or keep an erection. Tiredness (fatigue). Weak pulse or no pulse in the feet. Wounds and sores on the toes, feet, or legs. These take longer to heal. How is this treated? Underlying causes are treated first. Other conditions, like diabetes, high cholesterol, and blood  pressure, are also treated. Treatment may include: Lifestyle changes, such as: Quitting smoking. Getting regular exercise. Having a diet low in fat and cholesterol. Not drinking alcohol. Taking medicines, such as: Blood thinners. Medicines to improve blood flow. Medicines to improve your blood cholesterol. Procedures to: Open the arteries and restore blood flow. Insert a small mesh tube (stent) to keep a blocked vessel open. Create a new path for blood to flow to the body (peripheral bypass). Remove dead tissue from a wound. Remove an affected leg or arm. Follow these instructions at home: Medicines Take over-the-counter and prescription medicines only as told by your doctor. If you are taking blood thinners: Talk with your doctor before you take any medicines that have aspirin, or NSAIDs, such as ibuprofen. Take medicines exactly as told. Take them at the same time each day. Avoid doing things that could hurt or bruise you. Take action to prevent falls. Wear an alert bracelet or carry a card that shows you are taking blood thinners. Lifestyle     Get regular exercise. Ask your doctor about how to stay active. Talk with your doctor about keeping a healthy weight. If needed, ask about losing weight. Eat a diet that is low in fat and cholesterol. If you need help, talk with your doctor. Do not drink alcohol. Do not smoke or use any products that contain nicotine or tobacco. If you need help quitting, ask your doctor. General instructions Take good care of your feet. To do this: Wear shoes that fit well and feel good. Check your feet often for any cuts or sores. Get a flu shot (influenza vaccine) each year. Keep all follow-up visits. Where to find more information Society for Vascular Surgery: vascular.org American Heart Association: heart.org National Heart, Lung, and Blood Institute: https://www.hartman-hill.biz/ Contact a doctor if: You have cramps in your legs when you walk. You have  leg pain when you rest. Your leg or foot feels cold. Your skin changes. You cannot get or keep an erection. You have cuts or sores on your legs or feet that do not heal. Get help right away if: You have sudden changes in the color and feeling of your arms or legs, such as: Your arm or leg turns cold, numb, and blue. Your arm or leg becomes red, warm, swollen, painful, or numb. You have any signs of a stroke. "BE FAST" is an easy way to remember the main warning signs: B - Balance. Dizziness, sudden trouble walking, or loss of balance. E - Eyes. Trouble seeing or a change in how you see. F - Face. Sudden weakness or loss of feeling of the face. The face or eyelid may droop on one side. A - Arms. Weakness or loss of feeling in an arm. This happens all of a sudden and most often on one side of the body. S - Speech. Sudden trouble speaking, slurred speech, or trouble understanding what people say. T - Time. Time to call emergency services. Write down what time symptoms started. You have other signs of a stroke, such as: A sudden, very bad headache with no known cause. Feeling like you may vomit (nausea). Vomiting. A seizure. You have chest pain  or trouble breathing. These symptoms may be an emergency. Get help right away. Call your local emergency services (911 in the [place] Do not wait to see if the symptoms will go away. Do not drive yourself to the hospital. Summary Peripheral vascular disease (PVD) is a disease of the blood vessels. PVD affects the legs and feet the most. Symptoms may include leg pain or leg numbness, tingling, and weakness. Treatment may include lifestyle changes, medicines, and procedures. This information is not intended to replace advice given to you by your health care provider. Make sure you discuss any questions you have with your healthcare provider. Document Revised: 03/24/2020 Document Reviewed: 03/24/2020 Elsevier Patient Education  [employer].

## 2021-04-10 NOTE — Progress Notes (Signed)
Office Visit    Patient Name: Mary Krueger Date of Encounter: 04/11/2021  PCP:  Glendale Chard, [place]  Cardiologist:  Skeet Latch, MD  Advanced Practice Provider:  No care team member to display Electrophysiologist:  None    Chief Complaint    Mary Krueger is a 75 y.o. 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), hyperlipidemia presents today for follow up after PAD screening   Past Medical History    Past Medical History:  Diagnosis Date   Anxiety    Chronic   Breast mass, left 2005   fatty on U/S   Cataract    Fibroid 1987   TAH/USO   Glaucoma    Hyperlipidemia    Uses hearing aid    Bilateral    Vitamin D deficiency    Past Surgical History:  Procedure Laterality Date   BALLOON DILATION N/A 03/05/2019   Procedure: BALLOON DILATION;  Surgeon: Mauri Pole, MD;  Location: [facility] ENDOSCOPY;  Service: Endoscopy;  Laterality: N/A;  TTS balloon dilation   CATARACT EXTRACTION W/ INTRAOCULAR LENS IMPLANT Bilateral 12/2015 01/2016   COSMETIC SURGERY     Mini face lift   ESOPHAGEAL MANOMETRY N/A 03/05/2019   Procedure: ESOPHAGEAL MANOMETRY (EM);  Surgeon: Mauri Pole, MD;  Location: [facility] ENDOSCOPY;  Service: Endoscopy;  Laterality: N/A;   ESOPHAGOGASTRODUODENOSCOPY  01/2019   ESOPHAGOGASTRODUODENOSCOPY (EGD) WITH PROPOFOL N/A 01/17/2019   Procedure: ESOPHAGOGASTRODUODENOSCOPY (EGD) WITH PROPOFOL;  Surgeon: Jerene Bears, MD;  Location[facility];  Service: Gastroenterology;  Laterality: N/A;   ESOPHAGOGASTRODUODENOSCOPY (EGD) WITH PROPOFOL N/A 03/05/2019   Procedure: ESOPHAGOGASTRODUODENOSCOPY (EGD) WITH PROPOFOL;  Surgeon: Mauri Pole, MD;  Location: [facility] ENDOSCOPY;  Service: Endoscopy;  Laterality: N/A;   EYE SURGERY  06/2012   eye lift   FOREIGN BODY REMOVAL  01/17/2019   Procedure: FOREIGN BODY REMOVAL;  Surgeon: Jerene Bears, MD;  Location: [facility] ENDOSCOPY;  Service: Gastroenterology;;   LIPOMA EXCISION  Left 2009   left arm   STOMACH SURGERY  1997   Sphincter   TONSILLECTOMY AND ADENOIDECTOMY  1972   TOTAL ABDOMINAL HYSTERECTOMY W/ BILATERAL SALPINGOOPHORECTOMY     TRIGGER FINGER RELEASE     right    Allergies  Allergies  Allergen Reactions   Atorvastatin Other (See Comments)    myalgias   Rosuvastatin Other (See Comments)    myalgias    History of Present Illness    Mary Krueger is a 75 y.o. 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 Oval Linsey.  Previously evaluated in 2018 for palpitations by Dr. Percival Spanish with no workup recommended at that time.  Subsequently established with Dr. Oval Linsey for treatment of hyperlipidemia.  She has previous intolerance to atorvastatin, rosuvastatin, simvastatin with myalgias.  She was recommended for initiation of PCSK9 was started on Praluent with good response.  She presents today for follow-up.  Tells me she had ABIs done as a part of a promotional program through her insurance company.  She received the results which that they were abnormal and she requested to review. She had ABIs studies done 03/2021 with results of 0.74 on left and 0.89 on the right.   She notes discomfort in her hips with ambulation which improves as she walks.  She wonders whether this is sciatica or arthritis.  We discussed that it is more consistent with arthritis.  She tells me her feet "  go numb "sometimes but this is overall intermittent and not bothersome.  She walks 2 miles regularly for exercise without significant pain in her legs. Reports no shortness of breath nor dyspnea on exertion. Reports no chest pain, pressure, or tightness. No edema, orthopnea, PND. Reports no palpitations.  She continues to tolerate Praluent without difficulty.  EKGs/Labs/Other Studies Reviewed:   The following studies were reviewed today:   EKG:  No EKG today.  Recent Labs: 08/11/2020: Hemoglobin 14.0; Platelets 343 01/07/2021: ALT  15; BUN 18; Creatinine, Ser 0.86; Potassium 4.9; Sodium 145; TSH 2.750  Recent Lipid Panel    Component Value Date/Time   CHOL 186 06/19/2020 0921   TRIG 106 06/19/2020 0921   HDL 63 06/19/2020 0921   CHOLHDL 3.0 06/19/2020 0921   CHOLHDL 4.4 CALC 10/24/2008 0827   VLDL 20 10/24/2008 0827   LDLCALC 104 (H) 06/19/2020 0921   LDLDIRECT 166.6 10/24/2008 0827   Home Medications   Current Meds  Medication Sig   aspirin EC 81 MG tablet Take 81 mg by mouth daily.   calcium-vitamin D (OSCAL WITH D) 250-125 MG-UNIT tablet Take 1 tablet by mouth daily.   Cholecalciferol (VITAMIN D) 125 MCG (5000 UT) CAPS Take 5,000 Units by mouth daily.   latanoprost (XALATAN) 0.005 % ophthalmic solution    meloxicam (MOBIC) 15 MG tablet Take 15 mg by mouth as needed.    metoprolol succinate (TOPROL-XL) 25 MG 24 hr tablet TAKE ONE TABLET BY MOUTH EVERY EVENING   PARoxetine (PAXIL) 40 MG tablet TAKE ONE TABLET BY MOUTH DAILY   PRALUENT 150 MG/ML SOAJ INJECT 150MG  UNDER THE SKIN EVERY 14 DAYS   Vitamin D, Ergocalciferol, (DRISDOL) 1.25 MG (50000 UNIT) CAPS capsule TAKE 1 CAPSULE BY MOUTH 2 TIMES PER WEEK     Review of Systems      All other systems reviewed and are otherwise negative except as noted above.  Physical Exam    VS:  BP (!) 100/50   Pulse 77   Ht 5\' 4"  (1.626 m)   Wt 145 lb 3.2 oz (65.9 kg)   LMP 10/04/1984   SpO2 96%   BMI 24.92 kg/m  , BMI Body mass index is 24.92 kg/m.  Wt Readings from Last 3 Encounters:  04/10/21 145 lb 3.2 oz (65.9 kg)  01/07/21 147 lb (66.7 kg)  01/07/21 147 lb (66.7 kg)     GEN: Well nourished, well developed, in no acute distress. HEENT: normal. Neck: Supple, no JVD, carotid bruits, or masses. Cardiac: RRR, no murmurs, rubs, or gallops. No clubbing, cyanosis, edema.  Radials/PT 2+ and equal bilaterally.  Respiratory:  Respirations regular and unlabored, clear to auscultation bilaterally. GI: Soft, nontender, nondistended. MS: No deformity or  atrophy. Skin: Warm and dry, no rash. Neuro:  Strength and sensation are intact. Psych: Normal affect.  Assessment & Plan    PAD - ABIs studies done 03/2021 with results of 0.74 on left and 0.89 on the right.  She reports no claudication symptoms.  She does have some hip pain initially on walking that improves with ambulation we discussed this is likely some element of arthritis.  Long discussion regarding secondary prevention of peripheral arterial disease.  She is already on aspirin as well as lipid-lowering therapy.  Encouraged to continue her walking regimen to 2 miles per day.  Educated to report new or worsening claudication symptoms.  Plan for repeat ABI in 1 year and consideration of referral to Dr. Arida/Dr. Gwenlyn Found at that time.  HLD,  LDL <70 - 03/07/21 LDL 56. LDL at goal of <70.  Continue Praluent.  Disposition: Follow up in 1 year(s) with Dr. Oval Linsey or APP.  Signed, Loel Dubonnet, NP 04/11/2021, 9:28 AM [place]

## 2021-04-17 ENCOUNTER — Telehealth: Payer: Self-pay

## 2021-04-17 NOTE — Chronic Care Management (AMB) (Signed)
Chronic Care Management Pharmacy Assistant   Name: Mary Krueger  MRN: [id number] DOB: 1946/05/20   Reason for Encounter: Disease State/ Cholesterol   Recent office visits:  None  Recent consult visits:  04-10-2021 Loel Dubonnet, NP (Cardiology). No changes  Hospital visits:  None in previous 6 months  Medications: Outpatient Encounter Medications as of 04/17/2021  Medication Sig   aspirin EC 81 MG tablet Take 81 mg by mouth daily.   calcium-vitamin D (OSCAL WITH D) 250-125 MG-UNIT tablet Take 1 tablet by mouth daily.   Cholecalciferol (VITAMIN D) 125 MCG (5000 UT) CAPS Take 5,000 Units by mouth daily.   latanoprost (XALATAN) 0.005 % ophthalmic solution    meloxicam (MOBIC) 15 MG tablet Take 15 mg by mouth as needed.    metoprolol succinate (TOPROL-XL) 25 MG 24 hr tablet TAKE ONE TABLET BY MOUTH EVERY EVENING   PARoxetine (PAXIL) 40 MG tablet TAKE ONE TABLET BY MOUTH DAILY   PRALUENT 150 MG/ML SOAJ INJECT 150MG  UNDER THE SKIN EVERY 14 DAYS   Vitamin D, Ergocalciferol, (DRISDOL) 1.25 MG (50000 UNIT) CAPS capsule TAKE 1 CAPSULE BY MOUTH 2 TIMES PER WEEK   No facility-administered encounter medications on file as of 04/17/2021.  04/17/2021 Name: Mary Krueger MRN: [id number] DOB: 25-May-1946 Raley Li Bobo is a 75 y.o. year old female who is a primary care patient of Glendale Chard, MD.  Comprehensive medication review performed; Spoke to patient regarding cholesterol  Lipid Panel    Component Value Date/Time   CHOL 186 06/19/2020 0921   TRIG 106 06/19/2020 0921   HDL 63 06/19/2020 0921   LDLCALC 104 (H) 06/19/2020 0921   LDLDIRECT 166.6 10/24/2008 0827    10-year ASCVD risk score: The 10-year ASCVD risk score Mikey Bussing DC Jr., et al., 2013) is: 16.7%   Values used to calculate the score:     Age: 28 years     Sex: Female     Is Non-Hispanic African American: No     Diabetic: No     Tobacco smoker: No     Systolic Blood Pressure: 301 mmHg     Is BP treated:  Yes     HDL Cholesterol: 63 mg/dL     Total Cholesterol: 186 mg/dL  Current antihyperlipidemic regimen:  Praluent 150 mg inject 150 mg every 14 days  Previous antihyperlipidemic medications tried:  ASCVD risk enhancing conditions: age >66 and HTN What recent interventions/DTPs have been made by any provider to improve Cholesterol control since last CPP Visit: Patient states she is taking medications as directed and has improved her diet.  Any recent hospitalizations or ED visits since last visit with CPP? No  What diet changes have been made to improve Cholesterol?  Patient states she has cut out fried foods and is eating plenty of vegetables and fruits.  What exercise is being done to improve Cholesterol?  Patient states she cleans up around the house but doesn't have energy to exercise.   Adherence Review: Does the patient have >5 day gap between last estimated fill dates? No  NOTES: Patient states she feels her cholesterol is doing better but has ran out of Vitamin D. She's noticed that she doesn't has energy and would like a refill on the Vitamin D. Sent message to [facility] CMA. Made a telephone appointment with Orlando Penner CPP in September.  Care Gaps: Shingrix overdue PNA Vac overdue 2nd covid vaccine overdue RAF= 0.38% Medicare wellness 01-13-2022  Star Rating Drugs: None  [place]  [place] Pharmacist Assistant [phone]

## 2021-04-24 ENCOUNTER — Other Ambulatory Visit: Payer: Self-pay | Admitting: Internal Medicine

## 2021-04-30 ENCOUNTER — Other Ambulatory Visit: Payer: Self-pay

## 2021-04-30 MED ORDER — VITAMIN D (ERGOCALCIFEROL) 1.25 MG (50000 UNIT) PO CAPS: ORAL_CAPSULE | ORAL | 1 refills | Status: DC

## 2021-06-16 ENCOUNTER — Telehealth: Payer: Self-pay

## 2021-06-16 NOTE — Chronic Care Management (AMB) (Signed)
   Mary Krueger was reminded to have all medications, supplements and any blood glucose and blood pressure readings available for review with Orlando Penner, Pharm. D, at her telephone visit on 06-17-2021 at 11:00.   Questions: Have you had any recent office visit or specialist visit outside of [place]? Patient states no  Are there any concerns you would like to discuss during your office visit? Patient states no  Are you having any problems obtaining your medications? (Whether it pharmacy issues or cost) Patient states no  If patient has any PAP medications ask if they are having any problems getting their PAP medication or refill? No PAP medications.  Care Gaps: Shingrix overdue PNA Vac overdue 2nd covid vaccine overdue RAF= 0.38% Medicare wellness 01-13-2022  Star Rating Drug: None  Any gaps in medications fill history? No  [place] Pharmacist Assistant [phone]

## 2021-06-17 ENCOUNTER — Ambulatory Visit (INDEPENDENT_AMBULATORY_CARE_PROVIDER_SITE_OTHER): Payer: Medicare HMO

## 2021-06-17 DIAGNOSIS — I10 Essential (primary) hypertension: Secondary | ICD-10-CM

## 2021-06-17 DIAGNOSIS — E782 Mixed hyperlipidemia: Secondary | ICD-10-CM

## 2021-06-17 NOTE — Progress Notes (Signed)
Chronic Care Management Pharmacy Note  06/25/2021 Name:  Mary Krueger MRN:  [id number] DOB:  [birth year]  Summary: Patient reports doing well , the Vitamin D has been very helpful.   Recommendations/Changes made from today's visit: Recommend patient continue current medication regimen.   Plan: Patient to continue taking medication daily and stay physically active.    Subjective: Mary Krueger is an 75 y.o. year old female who is a primary patient of Glendale Chard, MD.  The CCM team was consulted for assistance with disease management and care coordination needs.    Engaged with patient by telephone for follow up visit in response to provider referral for pharmacy case management and/or care coordination services.   Consent to Services:  The patient was given information about Chronic Care Management services, agreed to services, and gave verbal consent prior to initiation of services.  Please see initial visit note for detailed documentation.   Patient Care Team: Glendale Chard, MD as PCP - General (Internal Medicine) Skeet Latch, MD as PCP - Cardiology (Cardiology) Cyril Mourning, [facility] (Inactive) (Pharmacist)  Recent office visits: 01/07/2021 PCP OV  Recent consult visits: 04/10/2021 Cardiology [facility] visits: None in previous 6 months   Objective:  Lab Results  Component Value Date   CREATININE 0.86 01/07/2021   BUN 18 01/07/2021   GFRNONAA 65 06/19/2020   GFRAA 75 06/19/2020   NA 145 (H) 01/07/2021   K 4.9 01/07/2021   CALCIUM 9.1 01/07/2021   CO2 23 01/07/2021   GLUCOSE 89 01/07/2021    Lab Results  Component Value Date/Time   HGBA1C 5.4 12/15/2018 08:55 AM   HGBA1C 5.6 05/10/2018 12:00 AM   MICROALBUR 10 08/11/2020 12:44 PM   MICROALBUR 80 12/26/2019 10:58 AM    Last diabetic Eye exam: No results found for: HMDIABEYEEXA  Last diabetic Foot exam: No results found for: HMDIABFOOTEX   Lab Results  Component Value Date   CHOL  186 06/19/2020   HDL 63 06/19/2020   LDLCALC 104 (H) 06/19/2020   LDLDIRECT 166.6 10/24/2008   TRIG 106 06/19/2020   CHOLHDL 3.0 06/19/2020    Hepatic Function Latest Ref Rng & Units 01/07/2021 06/19/2020 12/26/2019  Total Protein 6.0 - 8.5 g/dL 6.3 6.5 7.1  Albumin 3.7 - 4.7 g/dL 4.3 4.1 4.7  AST 0 - 40 IU/L _0 ALT 0 - 32 IU/L _1 Alk Phosphatase 44 - 121 IU/L 76 73 69  Total Bilirubin 0.0 - 1.2 mg/dL <0.2 0.3 0.2  Bilirubin, Direct 0.0 - 0.3 mg/dL - - -    Lab Results  Component Value Date/Time   TSH 2.750 01/07/2021 09:46 AM   TSH 3.310 06/21/2019 12:01 PM   FREET4 0.99 06/21/2019 12:01 PM    CBC Latest Ref Rng & Units 08/11/2020 06/21/2019 01/17/2019  WBC 3.4 - 10.8 x10E3/uL 7.6 6.1 7.8  Hemoglobin 11.1 - 15.9 g/dL 14.0 14.6 14.5  Hematocrit 34.0 - 46.6 % 39.1 42.7 44.3  Platelets 150 - 450 x10E3/uL 343 353 292    Lab Results  Component Value Date/Time   VD25OH 26.8 (L) 08/11/2020 12:47 PM    Clinical ASCVD: No  The 10-year ASCVD risk score (Arnett DK, et al., 2019) is: 16.7%   Values used to calculate the score:     Age: 77 years     Sex: Female     Is Non-Hispanic African American: No     Diabetic: No     Tobacco  smoker: No     Systolic Blood Pressure: 867 mmHg     Is BP treated: Yes     HDL Cholesterol: 63 mg/dL     Total Cholesterol: 186 mg/dL    Depression screen [facility] 2/9 01/07/2021 11/27/2020 12/26/2019  Decreased Interest 0 0 0  Down, Depressed, Hopeless 0 0 0  PHQ - 2 Score 0 0 0  Altered sleeping - - 0  Tired, decreased energy - - 0  Change in appetite - - 0  Feeling bad or failure about yourself  - - 0  Trouble concentrating - - 0  Moving slowly or fidgety/restless - - 0  Suicidal thoughts - - 0  PHQ-9 Score - - 0  Difficult doing work/chores - - Not difficult at all      Social History   Tobacco Use  Smoking Status Former   Packs/day: 1.50   Years: 18.00   Pack years: 27.00   Types: Cigarettes   Start date: 1963   Quit date:  1981   Years since quitting: 41.7  Smokeless Tobacco Never  Tobacco Comments   quit in 1981   BP Readings from Last 3 Encounters:  04/10/21 (!) 100/50  01/07/21 114/66  01/07/21 114/66   Pulse Readings from Last 3 Encounters:  04/10/21 77  01/07/21 71  01/07/21 71   Wt Readings from Last 3 Encounters:  04/10/21 145 lb 3.2 oz (65.9 kg)  01/07/21 147 lb (66.7 kg)  01/07/21 147 lb (66.7 kg)   BMI Readings from Last 3 Encounters:  04/10/21 24.92 kg/m  01/07/21 25.23 kg/m  01/07/21 25.23 kg/m    Assessment/Interventions: Review of patient past medical history, allergies, medications, health status, including review of consultants reports, laboratory and other test data, was performed as part of comprehensive evaluation and provision of chronic care management services.   SDOH:  (Social Determinants of Health) assessments and interventions performed: No  SDOH Screenings   Alcohol Screen: Not on file  Depression (PHQ2-9): Low Risk    PHQ-2 Score: 0  Financial Resource Strain: Low Risk    Difficulty of Paying Living Expenses: Not hard at all  Food Insecurity: No Food Insecurity   Worried About Charity fundraiser in the Last Year: Never true   Ran Out of Food in the Last Year: Never true  Housing: Not on file  Physical Activity: Sufficiently Active   Days of Exercise per Week: 3 days   Minutes of Exercise per Session: 60 min  Social Connections: Not on file  Stress: No Stress Concern Present   Feeling of Stress : Not at all  Tobacco Use: Medium Risk   Smoking Tobacco Use: Former   Smokeless Tobacco Use: Never  Transportation Needs: No Transportation Needs   Lack of Transportation (Medical): No   Lack of Transportation (Non-Medical): No    CCM Care Plan  Allergies  Allergen Reactions   Atorvastatin Other (See Comments)    myalgias   Rosuvastatin Other (See Comments)    myalgias    Medications Reviewed Today     Reviewed by Mayford Knife, RPH  (Pharmacist) on 06/17/21 at 1117  Med List Status: <None>   Medication Order Taking? Sig Documenting Provider Last Dose Status Informant  aspirin EC 81 MG tablet [id number] No Take 81 mg by mouth daily. [provider] Taking Active   calcium-vitamin D (OSCAL WITH D) 250-125 MG-UNIT tablet [id number] No Take 1 tablet by mouth daily. [provider] Taking Active   Cholecalciferol (  VITAMIN D) 125 MCG (5000 UT) CAPS [id number] No Take 5,000 Units by mouth daily. [provider] Taking Active   latanoprost (XALATAN) 0.005 % ophthalmic solution [id number] No  [provider] Taking Active   meloxicam (MOBIC) 15 MG tablet [id number] No Take 15 mg by mouth as needed.  [provider] Taking Active Self  metoprolol succinate (TOPROL-XL) 25 MG 24 hr tablet [id number]  TAKE ONE TABLET BY MOUTH EVERY Fredric Mare, MD  Active   PARoxetine (PAXIL) 40 MG tablet [id number] No TAKE ONE TABLET BY MOUTH DAILY Glendale Chard, MD Taking Active   PRALUENT 150 MG/ML Darden Palmer [id number] No INJECT 150MG UNDER THE SKIN EVERY 14 DAYS Skeet Latch, MD Taking Active   Vitamin D, Ergocalciferol, (DRISDOL) 1.25 MG (50000 UNIT) CAPS capsule [id number]  TAKE 1 CAPSULE BY MOUTH 2 TIMES PER WEEK Glendale Chard, MD  Active             Patient Active Problem List   Diagnosis Date Noted   Achalasia of esophagus    Esophageal dysphagia    Food impaction of esophagus    Pure hypercholesterolemia 12/14/2018   Essential hypertension 12/14/2018   Tachycardia 06/17/2018   Abnormal glucose 06/17/2018   Primary snoring 07/12/2016   SHOULDER PAIN, RIGHT, CHRONIC 12/17/2008   HYPERLIPIDEMIA 10/24/2008   Anxiety state 10/24/2008    Immunization History  Administered Date(s) Administered   Fluad Quad(high Dose 65+) 08/11/2020   Hep A / Hep B 08/23/2017   Hepatitis A 06/21/2017, 08/23/2017   Hepatitis B 06/21/2017, 08/23/2017   Influenza, High Dose Seasonal PF 06/21/2019    Influenza-Unspecified 07/30/2018   MMR 06/21/2017   PFIZER(Purple Top)SARS-COV-2 Vaccination 11/27/2019, 03/19/2020   Pneumococcal Polysaccharide-23 03/10/2010   Tdap 06/21/2017   Zoster Recombinat (Shingrix) 07/22/2017    Conditions to be addressed/monitored:  Hypertension and Hyperlipidemia  Care Plan : [place]  Updates made by Mayford Knife, [place] since 06/25/2021 12:00 AM     Problem: HTN, HLD   Priority: High     Long-Range Goal: DISEASE MANAGEMENT   Note:   Current Barriers:  Unable to independently monitor therapeutic efficacy  Pharmacist Clinical Goal(s):  Patient will achieve adherence to monitoring guidelines and medication adherence to achieve therapeutic efficacy through collaboration with PharmD and provider.   Interventions: 1:1 collaboration with Glendale Chard, MD regarding development and update of comprehensive plan of care as evidenced by provider attestation and co-signature Inter-disciplinary care team collaboration (see longitudinal plan of care) Comprehensive medication review performed; medication list updated in electronic medical record  Hypertension (BP goal <130/80) -Controlled -Current treatment: Metoprolol Succinate 25 mg tablet once per day  -Current home readings: 125/60  -Current dietary habits: patient is using frozen meals for her dinner, and we discussed checking for low sodium options  -Current exercise habits: she is exercising at least a 1/2 hour a day walking. She is also apart of the senior hiking group and she just finished a 3 1/2 mile hiking tour. They do about 3 and 1.2 mils of hiking once per week.  -Denies hypotensive/hypertensive symptoms -Educated on Daily salt intake goal < 2300 mg; Exercise goal of 150 minutes per week; -Counseled to monitor BP at home at least twice per week, document, and provide log at future appointments -Recommended to continue current medication  Hyperlipidemia: (LDL goal <  70) -Controlled -Current treatment: Praluent 150 mg,ml - injection once every 14 days  Aspirin 81 mg tablet taking 1 tablet by mouth daily   -  Current dietary habits: patient is using frozen meals for her dinner, and we discussed checking for low sodium options  -Current exercise habits: she is exercising at least a 1/2 hour a day walking. She is also apart of the senior hiking group and she just finished a 3 1/2 mile hiking tour. They do about 3 and 1.2 mils of hiking once per week.  -Educated on Cholesterol goals;  Benefits of statin for ASCVD risk reduction; Exercise goal of 150 minutes per week; -Recommended to continue current medication  Health Maintenance -Vaccine gaps: Influenza vaccine - Kristopher Oppenheim, Pneumonia Vaccine (PPSV 23 received) -patient will qualify other pneumonia -Counseled on diet and exercise extensively Recommended to continue current medication   Patient Goals/Self-Care Activities Patient will:  - take medications as prescribed  Follow Up Plan: The patient has been provided with contact information for the care management team and has been advised to call with any health related questions or concerns.       Medication Assistance: None required.  Patient affirms current coverage meets needs.  Compliance/Adherence/Medication fill history: Care Gaps: COVID-19 Booster Shingrix Vaccine Pneumonia Vaccine Influenza Vaccine - patient received   Star-Rating Drugs: NONE CURRENTLY   Patient's preferred pharmacy is:  [facility] [id number] - [address], [place] [address] Phone: [phone] Fax: [phone]  Uses pill box? Yes Pt endorses 100% compliance  We discussed: Benefits of medication synchronization, packaging and delivery as well as enhanced pharmacist oversight with [employer]. Patient decided to: Continue current medication management strategy  Care Plan and Follow Up Patient Decision:   Patient agrees to Care Plan and Follow-up.  Plan: Telephone follow up appointment with care management team member scheduled for:  12/2021 and The patient has been provided with contact information for the care management team and has been advised to call with any health related questions or concerns.   Orlando Penner, PharmD Clinical Pharmacist [facility] [phone]

## 2021-06-25 NOTE — Patient Instructions (Signed)
Visit Information It was great speaking with you today!  Please let me know if you have any questions about our visit.   Goals Addressed             This Visit's Progress    Manage My Medicine       Timeframe:  Long-Range Goal Priority:  High Start Date:                             Expected End Date:                       Follow Up Date 12/17/2021    - call for medicine refill 2 or 3 days before it runs out - call if I am sick and can't take my medicine - keep a list of all the medicines I take; vitamins and herbals too - use a pillbox to sort medicine - use an alarm clock or phone to remind me to take my medicine    Why is this important?   These steps will help you keep on track with your medicines.           Patient Care Plan: CCM Pharmacy Care Plan     Problem Identified: HTN, HLD   Priority: High     Long-Range Goal: DISEASE MANAGEMENT   Note:   Current Barriers:  Unable to independently monitor therapeutic efficacy  Pharmacist Clinical Goal(s):  Patient will achieve adherence to monitoring guidelines and medication adherence to achieve therapeutic efficacy through collaboration with PharmD and provider.   Interventions: 1:1 collaboration with Glendale Chard, MD regarding development and update of comprehensive plan of care as evidenced by provider attestation and co-signature Inter-disciplinary care team collaboration (see longitudinal plan of care) Comprehensive medication review performed; medication list updated in electronic medical record  Hypertension (BP goal <130/80) -Controlled -Current treatment: Metoprolol Succinate 25 mg tablet once per day  -Current home readings: 125/60  -Current dietary habits: patient is using frozen meals for her dinner, and we discussed checking for low sodium options  -Current exercise habits: she is exercising at least a 1/2 hour a day walking. She is also apart of the senior hiking group and she just finished a 3  1/2 mile hiking tour. They do about 3 and 1.2 mils of hiking once per week.  -Denies hypotensive/hypertensive symptoms -Educated on Daily salt intake goal < 2300 mg; Exercise goal of 150 minutes per week; -Counseled to monitor BP at home at least twice per week, document, and provide log at future appointments -Recommended to continue current medication  Hyperlipidemia: (LDL goal < 70) -Controlled -Current treatment: Praluent 150 mg,ml - injection once every 14 days  Aspirin 81 mg tablet taking 1 tablet by mouth daily   -Current dietary habits: patient is using frozen meals for her dinner, and we discussed checking for low sodium options  -Current exercise habits: she is exercising at least a 1/2 hour a day walking. She is also apart of the senior hiking group and she just finished a 3 1/2 mile hiking tour. They do about 3 and 1.2 mils of hiking once per week.  -Educated on Cholesterol goals;  Benefits of statin for ASCVD risk reduction; Exercise goal of 150 minutes per week; -Recommended to continue current medication  Health Maintenance -Vaccine gaps: Influenza vaccine - Kristopher Oppenheim, Pneumonia Vaccine (PPSV 23 received) -patient will qualify other pneumonia -Counseled on diet and exercise extensively  Recommended to continue current medication   Patient Goals/Self-Care Activities Patient will:  - take medications as prescribed  Follow Up Plan: The patient has been provided with contact information for the care management team and has been advised to call with any health related questions or concerns.       Patient agreed to services and verbal consent obtained.   The patient verbalized understanding of instructions, educational materials, and care plan provided today and agreed to receive a mailed copy of patient instructions, educational materials, and care plan.   Orlando Penner, PharmD Clinical Pharmacist [facility] [phone]

## 2021-07-03 DIAGNOSIS — E782 Mixed hyperlipidemia: Secondary | ICD-10-CM

## 2021-07-03 DIAGNOSIS — I10 Essential (primary) hypertension: Secondary | ICD-10-CM | POA: Diagnosis not present

## 2021-07-23 ENCOUNTER — Other Ambulatory Visit: Payer: Self-pay | Admitting: Internal Medicine

## 2021-08-12 ENCOUNTER — Other Ambulatory Visit: Payer: Self-pay

## 2021-08-12 ENCOUNTER — Ambulatory Visit (INDEPENDENT_AMBULATORY_CARE_PROVIDER_SITE_OTHER): Payer: Medicare HMO | Admitting: Internal Medicine

## 2021-08-12 ENCOUNTER — Encounter: Payer: Self-pay | Admitting: Internal Medicine

## 2021-08-12 VITALS — BP 118/80 | HR 76 | Temp 98.0°F | Ht 64.0 in | Wt 144.6 lb

## 2021-08-12 DIAGNOSIS — Z23 Encounter for immunization: Secondary | ICD-10-CM

## 2021-08-12 DIAGNOSIS — Z0000 Encounter for general adult medical examination without abnormal findings: Secondary | ICD-10-CM | POA: Diagnosis not present

## 2021-08-12 DIAGNOSIS — E559 Vitamin D deficiency, unspecified: Secondary | ICD-10-CM | POA: Diagnosis not present

## 2021-08-12 DIAGNOSIS — E2839 Other primary ovarian failure: Secondary | ICD-10-CM

## 2021-08-12 DIAGNOSIS — E782 Mixed hyperlipidemia: Secondary | ICD-10-CM | POA: Diagnosis not present

## 2021-08-12 DIAGNOSIS — H00014 Hordeolum externum left upper eyelid: Secondary | ICD-10-CM | POA: Insufficient documentation

## 2021-08-12 DIAGNOSIS — I10 Essential (primary) hypertension: Secondary | ICD-10-CM | POA: Diagnosis not present

## 2021-08-12 LAB — POCT UA - MICROALBUMIN
Albumin/Creatinine Ratio, Urine, POC: 30
Creatinine, POC: 200 mg/dL
Microalbumin Ur, POC: 10 mg/L

## 2021-08-12 LAB — POCT URINALYSIS DIPSTICK
Bilirubin, UA: NEGATIVE
Glucose, UA: NEGATIVE
Ketones, UA: NEGATIVE
Nitrite, UA: NEGATIVE
Protein, UA: NEGATIVE
Spec Grav, UA: 1.02 (ref 1.010–1.025)
Urobilinogen, UA: 0.2 E.U./dL
pH, UA: 5.5 (ref 5.0–8.0)

## 2021-08-12 MED ORDER — PREVNAR 20 0.5 ML IM SUSY: 0.5000 mL | PREFILLED_SYRINGE | INTRAMUSCULAR | 0 refills | Status: AC

## 2021-08-12 MED ORDER — TOBRADEX 0.3-0.1 % OP OINT: 1.0000 "application " | TOPICAL_OINTMENT | Freq: Three times a day (TID) | OPHTHALMIC | 0 refills | Status: DC

## 2021-08-12 NOTE — Progress Notes (Signed)
Rich Brave [place],acting as a Education administrator for Mary Greenland, Mary Krueger.,have documented all relevant documentation on the behalf of Mary Greenland, Mary Krueger,as directed by  Mary Greenland, Mary Krueger while in the presence of Mary Greenland, Mary Krueger.  This visit occurred during the SARS-CoV-2 public health emergency.  Safety protocols were in place, including screening questions prior to the visit, additional usage of staff PPE, and extensive cleaning of exam room while observing appropriate contact time as indicated for disinfecting solutions.  Subjective:     Patient ID: Mary Krueger , female    DOB: 1946-07-10 , 75 y.o.   MRN: [id number]   Chief Complaint  Patient presents with   Annual Exam   Hypertension    HPI  Patient here for HM. She is followed by Dr. Edwinna Areola for her GYN exams. She reports compliance with meds. She denies headaches, chest pain and shortness of breath.   Hypertension This is a chronic problem. The current episode started more than 1 year ago. The problem has been gradually improving since onset. The problem is controlled. Pertinent negatives include no blurred vision, chest pain, headaches, orthopnea or shortness of breath.    Past Medical History:  Diagnosis Date   Anxiety    Chronic   Breast mass, left 2005   fatty on U/S   Cataract    Fibroid 1987   TAH/USO   Glaucoma    Hyperlipidemia    Uses hearing aid    Bilateral    Vitamin D deficiency      Family History  Problem Relation Age of Onset   Diabetes Mother    Prostate cancer Father    Colon cancer Father    Diabetes Brother    Prostate cancer Brother    Colon polyps Neg Hx    Esophageal cancer Neg Hx    Stomach cancer Neg Hx    Rectal cancer Neg Hx      Current Outpatient Medications:    aspirin EC 81 MG tablet, Take 81 mg by mouth daily., Disp: , Rfl:    latanoprost (XALATAN) 0.005 % ophthalmic solution, , Disp: , Rfl:    meloxicam (MOBIC) 15 MG tablet, Take 15 mg by mouth as needed. , Disp:  , Rfl:    metoprolol succinate (TOPROL-XL) 25 MG 24 hr tablet, TAKE ONE TABLET BY MOUTH EVERY EVENING, Disp: 90 tablet, Rfl: 0   PARoxetine (PAXIL) 40 MG tablet, TAKE ONE TABLET BY MOUTH DAILY, Disp: 90 tablet, Rfl: 1   pneumococcal 20-valent conjugate vaccine (PREVNAR 20) 0.5 ML injection, Inject 0.5 mLs into the muscle tomorrow at 10 am for 1 dose., Disp: 0.5 mL, Rfl: 0   PRALUENT 150 MG/ML SOAJ, INJECT 150MG UNDER THE SKIN EVERY 14 DAYS, Disp: 2 mL, Rfl: 12   tobramycin-dexamethasone (TOBRADEX) ophthalmic ointment, Place 1 application into the left eye 3 (three) times daily. Use for five days, Disp: 3.5 g, Rfl: 0   Vitamin D, Ergocalciferol, (DRISDOL) 1.25 MG (50000 UNIT) CAPS capsule, TAKE 1 CAPSULE BY MOUTH 2 TIMES PER WEEK, Disp: 24 capsule, Rfl: 1   calcium-vitamin D (OSCAL WITH D) 250-125 MG-UNIT tablet, Take 1 tablet by mouth daily. (Patient not taking: Reported on 08/12/2021), Disp: , Rfl:    Allergies  Allergen Reactions   Atorvastatin Other (See Comments)    myalgias   Rosuvastatin Other (See Comments)    myalgias      The patient states she uses post menopausal status for birth control. Last LMP was Patient's  last menstrual period was 10/04/1984.. Negative for Dysmenorrhea. Negative for: breast discharge, breast lump(s), breast pain and breast self exam. Associated symptoms include abnormal vaginal bleeding. Pertinent negatives include abnormal bleeding (hematology), anxiety, decreased libido, depression, difficulty falling sleep, dyspareunia, history of infertility, nocturia, sexual dysfunction, sleep disturbances, urinary incontinence, urinary urgency, vaginal discharge and vaginal itching. Diet regular.The patient states her exercise level is  moderate.  . The patient's tobacco use is:  Social History   Tobacco Use  Smoking Status Former   Packs/day: 1.50   Years: 18.00   Pack years: 27.00   Types: Cigarettes   Start date: 1963   Quit date: 1981   Years since quitting:  41.8  Smokeless Tobacco Never  Tobacco Comments   quit in 1981  . She has been exposed to passive smoke. The patient's alcohol use is:  Social History   Substance and Sexual Activity  Alcohol Use No   Alcohol/week: 0.0 standard drinks   Review of Systems  Constitutional: Negative.   HENT: Negative.    Eyes: Negative.  Negative for blurred vision.  Respiratory: Negative.  Negative for shortness of breath.   Cardiovascular: Negative.  Negative for chest pain and orthopnea.  Gastrointestinal: Negative.   Endocrine: Negative.   Genitourinary: Negative.   Musculoskeletal: Negative.   Skin: Negative.   Allergic/Immunologic: Negative.   Neurological: Negative.  Negative for headaches.  Hematological: Negative.   Psychiatric/Behavioral: Negative.      Today's Vitals   08/12/21 1537  BP: 118/80  Pulse: 76  Temp: 98 F (36.7 C)  Weight: 144 lb 9.6 oz (65.6 kg)  Height: 5' 4"  (1.626 m)  PainSc: 0-No pain   Body mass index is 24.82 kg/m.   Objective:  Physical Exam Vitals and nursing note reviewed.  Constitutional:      Appearance: Normal appearance.  HENT:     Head: Normocephalic and atraumatic.     Right Ear: Tympanic membrane, ear canal and external ear normal.     Left Ear: Tympanic membrane, ear canal and external ear normal.     Nose:     Comments: Masked     Mouth/Throat:     Comments: Masked  Eyes:     General: Vision grossly intact.        Left eye: Hordeolum present.    Extraocular Movements: Extraocular movements intact.     Conjunctiva/sclera: Conjunctivae normal.     Pupils: Pupils are equal, round, and reactive to light.     Comments: Left lid swelling, slightly tender  Cardiovascular:     Rate and Rhythm: Normal rate and regular rhythm.     Pulses: Normal pulses.     Heart sounds: Normal heart sounds.  Pulmonary:     Effort: Pulmonary effort is normal.     Breath sounds: Normal breath sounds.  Chest:  Breasts:    Tanner Score is 5.     Right:  Normal.     Left: Normal.  Abdominal:     General: Abdomen is flat. Bowel sounds are normal.     Palpations: Abdomen is soft.  Genitourinary:    Comments: deferred Musculoskeletal:        General: Normal range of motion.     Cervical back: Normal range of motion and neck supple.  Skin:    General: Skin is warm and dry.     Comments: Abrasions b/l shins  Neurological:     General: No focal deficit present.     Mental Status: She is  alert and oriented to person, place, and time.  Psychiatric:        Mood and Affect: Mood normal.        Behavior: Behavior normal.        Assessment And Plan:     1. Encounter for general adult medical examination w/o abnormal findings Comments: A full exam was performed. Importance of monthly self breast exams was discussed with the patient. PATIENT IS ADVISED TO GET 30-45 MINUTES REGULAR EXERCISE NO LESS THAN FOUR TO FIVE DAYS PER WEEK - BOTH WEIGHTBEARING EXERCISES AND AEROBIC ARE RECOMMENDED.  PATIENT IS ADVISED TO FOLLOW A HEALTHY DIET WITH AT LEAST SIX FRUITS/VEGGIES PER DAY, DECREASE INTAKE OF RED MEAT, AND TO INCREASE FISH INTAKE TO TWO DAYS PER WEEK.  MEATS/FISH SHOULD NOT BE FRIED, BAKED OR BROILED IS PREFERABLE.  IT IS ALSO IMPORTANT TO CUT BACK ON YOUR SUGAR INTAKE. PLEASE AVOID ANYTHING WITH ADDED SUGAR, CORN SYRUP OR OTHER SWEETENERS. IF YOU MUST USE A SWEETENER, YOU CAN TRY STEVIA. IT IS ALSO IMPORTANT TO AVOID ARTIFICIALLY SWEETENERS AND DIET BEVERAGES. LASTLY, I SUGGEST WEARING SPF 50 SUNSCREEN ON EXPOSED PARTS AND ESPECIALLY WHEN IN THE DIRECT SUNLIGHT FOR AN EXTENDED PERIOD OF TIME.  PLEASE AVOID FAST FOOD RESTAURANTS AND INCREASE YOUR WATER INTAKE.  2. Essential hypertension Comments: Chronic, well controlled. EKG performed, NSR w/o acute changes. Encouraged to follow low sodium diet. She will f/u in 6 months for re-evaluation.  - POCT Urinalysis Dipstick (81002) - POCT UA - Microalbumin - EKG 12-Lead - CBC - CMP14+EGFR  3. Mixed  hyperlipidemia Comments: She is now taking Praluent. I will check non-fasting lipid panel today. She is encouraged to follow heart healthy lifestyle.  - Lipid panel  4. Vitamin D deficiency Comments: I will check vitamin D level and supplement as needed. - Vitamin D (25 hydroxy)  5. Hordeolum externum of left upper eyelid Comments: She has yet to contact her ophthalmologist.  I will send in rx Tobradex ointment to use bid-tid x 5 days.  - tobramycin-dexamethasone (TOBRADEX) ophthalmic ointment; Place 1 application into the left eye 3 (three) times daily. Use for five days  Dispense: 3.5 g; Refill: 0  6. Estrogen deficiency Comments: I will refer her for bone density. Last one performed Jan 2020. Encouraged to engage in weight -bearing exercises 3x/week and c/w calcium/vit d supplementation. - DG Bone Density; Future  7. Immunization due Comments: I will send rx [id number] to her local pharmacy.  - pneumococcal 20-valent conjugate vaccine (PREVNAR 20) 0.5 ML injection; Inject 0.5 mLs into the muscle tomorrow at 10 am for 1 dose.  Dispense: 0.5 mL; Refill: 0  Patient was given opportunity to ask questions. Patient verbalized understanding of the plan and was able to repeat key elements of the plan. All questions were answered to their satisfaction.   I, Mary Greenland, Mary Krueger, have reviewed all documentation for this visit. The documentation on 08/12/21 for the exam, diagnosis, procedures, and orders are all accurate and complete.  THE PATIENT IS ENCOURAGED TO PRACTICE SOCIAL DISTANCING DUE TO THE COVID-19 PANDEMIC.

## 2021-08-12 NOTE — Patient Instructions (Addendum)
Don't forget to get Prevnar-20 injection(pneumonia shot)  Health Maintenance, Female Adopting a healthy lifestyle and getting preventive care are important in promoting health and wellness. Ask your health care provider about: The right schedule for you to have regular tests and exams. Things you can do on your own to prevent diseases and keep yourself healthy. What should I know about diet, weight, and exercise? Eat a healthy diet  Eat a diet that includes plenty of vegetables, fruits, low-fat dairy products, and lean protein. Do not eat a lot of foods that are high in solid fats, added sugars, or sodium. Maintain a healthy weight Body mass index (BMI) is used to identify weight problems. It estimates body fat based on height and weight. Your health care provider can help determine your BMI and help you achieve or maintain a healthy weight. Get regular exercise Get regular exercise. This is one of the most important things you can do for your health. Most adults should: Exercise for at least 150 minutes each week. The exercise should increase your heart rate and make you sweat (moderate-intensity exercise). Do strengthening exercises at least twice a week. This is in addition to the moderate-intensity exercise. Spend less time sitting. Even light physical activity can be beneficial. Watch cholesterol and blood lipids Have your blood tested for lipids and cholesterol at 77 years of age, then have this test every 5 years. Have your cholesterol levels checked more often if: Your lipid or cholesterol levels are high. You are older than 77 years of age. You are at high risk for heart disease. What should I know about cancer screening? Depending on your health history and family history, you may need to have cancer screening at various ages. This may include screening for: Breast cancer. Cervical cancer. Colorectal cancer. Skin cancer. Lung cancer. What should I know about heart disease,  diabetes, and high blood pressure? Blood pressure and heart disease High blood pressure causes heart disease and increases the risk of stroke. This is more likely to develop in people who have high blood pressure readings or are overweight. Have your blood pressure checked: Every 3-5 years if you are 52-49 years of age. Every year if you are 36 years old or older. Diabetes Have regular diabetes screenings. This checks your fasting blood sugar level. Have the screening done: Once every three years after age 36 if you are at a normal weight and have a low risk for diabetes. More often and at a younger age if you are overweight or have a high risk for diabetes. What should I know about preventing infection? Hepatitis B If you have a higher risk for hepatitis B, you should be screened for this virus. Talk with your health care provider to find out if you are at risk for hepatitis B infection. Hepatitis C Testing is recommended for: Everyone born from [birth year] through 1965. Anyone with known risk factors for hepatitis C. Sexually transmitted infections (STIs) Get screened for STIs, including gonorrhea and chlamydia, if: You are sexually active and are younger than 77 years of age. You are older than 77 years of age and your health care provider tells you that you are at risk for this type of infection. Your sexual activity has changed since you were last screened, and you are at increased risk for chlamydia or gonorrhea. Ask your health care provider if you are at risk. Ask your health care provider about whether you are at high risk for HIV. Your health care provider may  recommend a prescription medicine to help prevent HIV infection. If you choose to take medicine to prevent HIV, you should first get tested for HIV. You should then be tested every 3 months for as long as you are taking the medicine. Pregnancy If you are about to stop having your period (premenopausal) and you may become pregnant,  seek counseling before you get pregnant. Take 400 to 800 micrograms (mcg) of folic acid every day if you become pregnant. Ask for birth control (contraception) if you want to prevent pregnancy. Osteoporosis and menopause Osteoporosis is a disease in which the bones lose minerals and strength with aging. This can result in bone fractures. If you are 21 years old or older, or if you are at risk for osteoporosis and fractures, ask your health care provider if you should: Be screened for bone loss. Take a calcium or vitamin D supplement to lower your risk of fractures. Be given hormone replacement therapy (HRT) to treat symptoms of menopause. Follow these instructions at home: Alcohol use Do not drink alcohol if: Your health care provider tells you not to drink. You are pregnant, may be pregnant, or are planning to become pregnant. If you drink alcohol: Limit how much you have to: 0-1 drink a day. Know how much alcohol is in your drink. In the [place] one drink equals one 12 oz bottle of beer (355 mL), one 5 oz glass of wine (148 mL), or one 1 oz glass of hard liquor (44 mL). Lifestyle Do not use any products that contain nicotine or tobacco. These products include cigarettes, chewing tobacco, and vaping devices, such as e-cigarettes. If you need help quitting, ask your health care provider. Do not use street drugs. Do not share needles. Ask your health care provider for help if you need support or information about quitting drugs. General instructions Schedule regular health, dental, and eye exams. Stay current with your vaccines. Tell your health care provider if: You often feel depressed. You have ever been abused or do not feel safe at home. Summary Adopting a healthy lifestyle and getting preventive care are important in promoting health and wellness. Follow your health care provider's instructions about healthy diet, exercising, and getting tested or screened for diseases. Follow your  health care provider's instructions on monitoring your cholesterol and blood pressure. This information is not intended to replace advice given to you by your health care provider. Make sure you discuss any questions you have with your health care provider. Document Revised: 02/09/2021 Document Reviewed: 02/09/2021 Elsevier Patient Education  [place].

## 2021-08-13 LAB — CBC
Hematocrit: 38.2 % (ref 34.0–46.6)
Hemoglobin: 13.6 g/dL (ref 11.1–15.9)
MCH: 32.5 pg (ref 26.6–33.0)
MCHC: 35.6 g/dL (ref 31.5–35.7)
MCV: 91 fL (ref 79–97)
Platelets: 335 10*3/uL (ref 150–450)
RBC: 4.18 x10E6/uL (ref 3.77–5.28)
RDW: 12.1 % (ref 11.7–15.4)
WBC: 7 10*3/uL (ref 3.4–10.8)

## 2021-08-13 LAB — CMP14+EGFR
ALT: 14 IU/L (ref 0–32)
AST: 20 IU/L (ref 0–40)
Albumin/Globulin Ratio: 1.8 (ref 1.2–2.2)
Albumin: 4.4 g/dL (ref 3.7–4.7)
Alkaline Phosphatase: 74 IU/L (ref 44–121)
BUN/Creatinine Ratio: 21 (ref 12–28)
BUN: 18 mg/dL (ref 8–27)
Bilirubin Total: <0.2 mg/dL (ref 0.0–1.2)
CO2: 27 mmol/L (ref 20–29)
Calcium: 9.3 mg/dL (ref 8.7–10.3)
Chloride: 101 mmol/L (ref 96–106)
Creatinine, Ser: 0.87 mg/dL (ref 0.57–1.00)
Globulin, Total: 2.5 g/dL (ref 1.5–4.5)
Glucose: 90 mg/dL (ref 70–99)
Potassium: 4.3 mmol/L (ref 3.5–5.2)
Sodium: 140 mmol/L (ref 134–144)
Total Protein: 6.9 g/dL (ref 6.0–8.5)
eGFR: 69 mL/min/{1.73_m2} (ref >59)

## 2021-08-13 LAB — LIPID PANEL
Chol/HDL Ratio: 3.3 ratio (ref 0.0–4.4)
Cholesterol, Total: 211 mg/dL — ABNORMAL HIGH (ref 100–199)
HDL: 63 mg/dL (ref >39)
LDL Chol Calc (NIH): 119 mg/dL — ABNORMAL HIGH (ref 0–99)
Triglycerides: 164 mg/dL — ABNORMAL HIGH (ref 0–149)
VLDL Cholesterol Cal: 29 mg/dL (ref 5–40)

## 2021-08-13 LAB — VITAMIN D 25 HYDROXY (VIT D DEFICIENCY, FRACTURES): Vit D, 25-Hydroxy: 101 ng/mL — ABNORMAL HIGH (ref 30.0–100.0)

## 2021-08-21 DIAGNOSIS — D2371 Other benign neoplasm of skin of right lower limb, including hip: Secondary | ICD-10-CM | POA: Diagnosis not present

## 2021-08-24 DIAGNOSIS — H52223 Regular astigmatism, bilateral: Secondary | ICD-10-CM | POA: Diagnosis not present

## 2021-08-24 DIAGNOSIS — H5202 Hypermetropia, left eye: Secondary | ICD-10-CM | POA: Diagnosis not present

## 2021-08-24 DIAGNOSIS — H0014 Chalazion left upper eyelid: Secondary | ICD-10-CM | POA: Diagnosis not present

## 2021-08-24 DIAGNOSIS — H524 Presbyopia: Secondary | ICD-10-CM | POA: Diagnosis not present

## 2021-09-03 ENCOUNTER — Telehealth: Payer: Self-pay

## 2021-09-03 NOTE — Progress Notes (Signed)
    Chronic Care Management Pharmacy Assistant   Name: Mary Krueger  MRN: [id number] DOB: 1946/04/17  Reason for Encounter: Disease State/ Hypertension Adherence Call   Conditions to be addressed/monitored:Hypertension   Recent office visits:   08/12/2021 Glendale Chard MD (PCP) Office Visit-  Pneumococcal 20-Val Conj Vacc 0.5 ML given,  start  Tobramycin-Dexamethasone 0.3-0.1 % apply to left eye three times a day for 5 days, stop Cholecalciferol.   Hospital visits:  None in previous 6 months  Medications: Outpatient Encounter Medications as of 09/03/2021  Medication Sig   aspirin EC 81 MG tablet Take 81 mg by mouth daily.   calcium-vitamin D (OSCAL WITH D) 250-125 MG-UNIT tablet Take 1 tablet by mouth daily. (Patient not taking: Reported on 08/12/2021)   latanoprost (XALATAN) 0.005 % ophthalmic solution    meloxicam (MOBIC) 15 MG tablet Take 15 mg by mouth as needed.    metoprolol succinate (TOPROL-XL) 25 MG 24 hr tablet TAKE ONE TABLET BY MOUTH EVERY EVENING   PARoxetine (PAXIL) 40 MG tablet TAKE ONE TABLET BY MOUTH DAILY   PRALUENT 150 MG/ML SOAJ INJECT 150MG  UNDER THE SKIN EVERY 14 DAYS   tobramycin-dexamethasone (TOBRADEX) ophthalmic ointment Place 1 application into the left eye 3 (three) times daily. Use for five days   Vitamin D, Ergocalciferol, (DRISDOL) 1.25 MG (50000 UNIT) CAPS capsule TAKE 1 CAPSULE BY MOUTH 2 TIMES PER WEEK   No facility-administered encounter medications on file as of 09/03/2021.   Recent Office Vitals: BP Readings from Last 3 Encounters:  08/12/21 118/80  04/10/21 (!) 100/50  01/07/21 114/66   Pulse Readings from Last 3 Encounters:  08/12/21 76  04/10/21 77  01/07/21 71    Wt Readings from Last 3 Encounters:  08/12/21 144 lb 9.6 oz (65.6 kg)  04/10/21 145 lb 3.2 oz (65.9 kg)  01/07/21 147 lb (66.7 kg)     Kidney Function Lab Results  Component Value Date/Time   CREATININE 0.87 08/12/2021 04:51 PM   CREATININE 0.86 01/07/2021  09:46 AM   GFRNONAA 65 06/19/2020 09:21 AM   GFRAA 75 06/19/2020 09:21 AM    BMP Latest Ref Rng & Units 08/12/2021 01/07/2021 06/19/2020  Glucose 70 - 99 mg/dL 90 89 91  BUN 8 - 27 mg/dL 18 18 18   Creatinine 0.57 - 1.00 mg/dL 0.87 0.86 0.87  BUN/Creat Ratio 12 - 28 21 21 21   Sodium 134 - 144 mmol/L 140 145(H) 142  Potassium 3.5 - 5.2 mmol/L 4.3 4.9 4.6  Chloride 96 - 106 mmol/L 101 104 105  CO2 20 - 29 mmol/L 27 23 28   Calcium 8.7 - 10.3 mg/dL 9.3 9.1 9.1     Current antihypertensive regimen:  Toprol XL 25mg     Called patient 09/03/2021 left message Called patient left 09/04/2021 message  Called patient left 09/07/2021 message   Care Gaps:  Shingrix overdue PNA Vac 08/12/2021 2nd covid vaccine overdue RAF= 0.38% Medicare wellness 01-13-2022  Star Rating Drugs:  None    Cherlyn Labella Clinical Pharmacist Assistant [phone]

## 2021-10-07 ENCOUNTER — Telehealth: Payer: Self-pay

## 2021-10-07 NOTE — Chronic Care Management (AMB) (Signed)
° ° °  Chronic Care Management Pharmacy Assistant   Name: Mary Krueger  MRN: [id number] DOB: 1946-08-15  Reason for Encounter: Reschedule appointment   Medications: Outpatient Encounter Medications as of 10/07/2021  Medication Sig   aspirin EC 81 MG tablet Take 81 mg by mouth daily.   calcium-vitamin D (OSCAL WITH D) 250-125 MG-UNIT tablet Take 1 tablet by mouth daily. (Patient not taking: Reported on 08/12/2021)   latanoprost (XALATAN) 0.005 % ophthalmic solution    meloxicam (MOBIC) 15 MG tablet Take 15 mg by mouth as needed.    metoprolol succinate (TOPROL-XL) 25 MG 24 hr tablet TAKE ONE TABLET BY MOUTH EVERY EVENING   PARoxetine (PAXIL) 40 MG tablet TAKE ONE TABLET BY MOUTH DAILY   PRALUENT 150 MG/ML SOAJ INJECT 150MG  UNDER THE SKIN EVERY 14 DAYS   tobramycin-dexamethasone (TOBRADEX) ophthalmic ointment Place 1 application into the left eye 3 (three) times daily. Use for five days   Vitamin D, Ergocalciferol, (DRISDOL) 1.25 MG (50000 UNIT) CAPS capsule TAKE 1 CAPSULE BY MOUTH 2 TIMES PER WEEK   No facility-administered encounter medications on file as of 10/07/2021.    10-07-2021: Called patient to reschedule telephone appointment with Orlando Penner CPP on 12-17-2020 to 12-18-2021. Left patient a voicemail with changed appointment day and to call if needing to be changed.  [place] Pharmacist Assistant [phone]

## 2021-10-21 ENCOUNTER — Other Ambulatory Visit: Payer: Self-pay | Admitting: Internal Medicine

## 2021-11-03 DIAGNOSIS — H02135 Senile ectropion of left lower eyelid: Secondary | ICD-10-CM | POA: Diagnosis not present

## 2021-11-03 DIAGNOSIS — H0014 Chalazion left upper eyelid: Secondary | ICD-10-CM | POA: Diagnosis not present

## 2021-11-03 DIAGNOSIS — H04203 Unspecified epiphora, bilateral lacrimal glands: Secondary | ICD-10-CM | POA: Diagnosis not present

## 2021-11-03 DIAGNOSIS — H02132 Senile ectropion of right lower eyelid: Secondary | ICD-10-CM | POA: Diagnosis not present

## 2021-11-25 ENCOUNTER — Telehealth: Payer: Self-pay

## 2021-11-25 NOTE — Chronic Care Management (AMB) (Signed)
° ° °  Chronic Care Management Pharmacy Assistant   Name: Pavneet Markwood  MRN: [id number] DOB: 03-04-1946   Reason for Encounter: Disease State/ Hypertension  Recent office visits:  08-12-2021 Glendale Chard, MD. Cholesterol= 211, Trig= 164, LDL= 119. Abnormal UA. Vitamin D= 101. EKG completed. Prevnar ordered. START Tobradex place 1 application into left eye 3 times daily for 5 days. STOP Vitamin D.  Recent consult visits:  08-24-2021 Delsa Sale, MD (Optometry). Unable to view encounter.  08-21-2021 Allyn Kenner, MD (Dermatology). Unable to view encounter.  Hospital visits:  None in previous 6 months  Medications: Outpatient Encounter Medications as of 11/25/2021  Medication Sig   aspirin EC 81 MG tablet Take 81 mg by mouth daily.   calcium-vitamin D (OSCAL WITH D) 250-125 MG-UNIT tablet Take 1 tablet by mouth daily. (Patient not taking: Reported on 08/12/2021)   latanoprost (XALATAN) 0.005 % ophthalmic solution    meloxicam (MOBIC) 15 MG tablet Take 15 mg by mouth as needed.    metoprolol succinate (TOPROL-XL) 25 MG 24 hr tablet TAKE ONE TABLET BY MOUTH EVERY EVENING   PARoxetine (PAXIL) 40 MG tablet TAKE ONE TABLET BY MOUTH DAILY   PRALUENT 150 MG/ML SOAJ INJECT 150MG  UNDER THE SKIN EVERY 14 DAYS   tobramycin-dexamethasone (TOBRADEX) ophthalmic ointment Place 1 application into the left eye 3 (three) times daily. Use for five days   Vitamin D, Ergocalciferol, (DRISDOL) 1.25 MG (50000 UNIT) CAPS capsule TAKE 1 CAPSULE BY MOUTH 2 TIMES PER WEEK   No facility-administered encounter medications on file as of 11/25/2021.  Reviewed chart prior to disease state call.   Recent Office Vitals: BP Readings from Last 3 Encounters:  08/12/21 118/80  04/10/21 (!) 100/50  01/07/21 114/66   Pulse Readings from Last 3 Encounters:  08/12/21 76  04/10/21 77  01/07/21 71    Wt Readings from Last 3 Encounters:  08/12/21 144 lb 9.6 oz (65.6 kg)  04/10/21 145 lb 3.2 oz (65.9 kg)   01/07/21 147 lb (66.7 kg)     Kidney Function Lab Results  Component Value Date/Time   CREATININE 0.87 08/12/2021 04:51 PM   CREATININE 0.86 01/07/2021 09:46 AM   GFRNONAA 65 06/19/2020 09:21 AM   GFRAA 75 06/19/2020 09:21 AM    BMP Latest Ref Rng & Units 08/12/2021 01/07/2021 06/19/2020  Glucose 70 - 99 mg/dL 90 89 91  BUN 8 - 27 mg/dL 18 18 18   Creatinine 0.57 - 1.00 mg/dL 0.87 0.86 0.87  BUN/Creat Ratio 12 - 28 21 21 21   Sodium 134 - 144 mmol/L 140 145(H) 142  Potassium 3.5 - 5.2 mmol/L 4.3 4.9 4.6  Chloride 96 - 106 mmol/L 101 104 105  CO2 20 - 29 mmol/L 27 23 28   Calcium 8.7 - 10.3 mg/dL 9.3 9.1 9.1     11-25-2021: 1st attempt left VM 12-01-2021: 2nd attempt left VM  Care Gaps: PNA Vac overdue Shingrix overdue AWV 01-13-2022  Star Rating Drugs: None  Jeannette How [place] Clinical Pharmacist Assistant [phone]

## 2021-12-02 ENCOUNTER — Ambulatory Visit (HOSPITAL_BASED_OUTPATIENT_CLINIC_OR_DEPARTMENT_OTHER): Payer: Medicare HMO | Admitting: Obstetrics & Gynecology

## 2021-12-17 ENCOUNTER — Telehealth: Payer: Medicare HMO

## 2021-12-18 ENCOUNTER — Telehealth: Payer: Medicare HMO

## 2022-01-13 ENCOUNTER — Ambulatory Visit: Payer: Medicare HMO

## 2022-01-13 ENCOUNTER — Ambulatory Visit: Payer: Medicare HMO | Admitting: Internal Medicine

## 2022-01-15 ENCOUNTER — Other Ambulatory Visit: Payer: Self-pay | Admitting: Obstetrics & Gynecology

## 2022-01-15 DIAGNOSIS — Z1231 Encounter for screening mammogram for malignant neoplasm of breast: Secondary | ICD-10-CM

## 2022-01-19 ENCOUNTER — Other Ambulatory Visit: Payer: Self-pay | Admitting: Internal Medicine

## 2022-01-27 ENCOUNTER — Telehealth: Payer: Self-pay | Admitting: Internal Medicine

## 2022-01-27 NOTE — Telephone Encounter (Signed)
Left message for patient to call back and schedule Medicare Annual Wellness Visit (AWV) in office.  ? ?If unable to come into the office for AWV,  please offer to do virtually or by telephone. ? ?Last AWV: 01/07/2021 ? ?Please schedule at anytime with [facility]. ? ?30 minute appointment for Virtual or phone ?45 minute appointment for in office or Initial virtual/phone ? ?Any questions, please contact me at [phone]   ?

## 2022-02-01 ENCOUNTER — Ambulatory Visit
Admission: RE | Admit: 2022-02-01 | Discharge: 2022-02-01 | Disposition: A | Discharge status: Home / Self Care | Payer: Medicare HMO | Source: Ambulatory Visit | Attending: Internal Medicine | Admitting: Internal Medicine

## 2022-02-01 DIAGNOSIS — E2839 Other primary ovarian failure: Secondary | ICD-10-CM

## 2022-02-02 ENCOUNTER — Telehealth: Payer: Self-pay | Admitting: Internal Medicine

## 2022-02-02 NOTE — Telephone Encounter (Signed)
Left message for patient to call back and schedule Medicare Annual Wellness Visit (AWV) either virtually or in office.  Left both my jabber number [phone] and office number    Last AWV ;01/07/21 please schedule at anytime with [facility]     

## 2022-02-09 ENCOUNTER — Ambulatory Visit (INDEPENDENT_AMBULATORY_CARE_PROVIDER_SITE_OTHER): Payer: Medicare HMO | Admitting: Internal Medicine

## 2022-02-09 ENCOUNTER — Encounter: Payer: Self-pay | Admitting: Internal Medicine

## 2022-02-09 VITALS — BP 110/64 | HR 74 | Temp 99.0°F | Ht 63.4 in | Wt 145.4 lb

## 2022-02-09 DIAGNOSIS — F411 Generalized anxiety disorder: Secondary | ICD-10-CM | POA: Diagnosis not present

## 2022-02-09 DIAGNOSIS — E782 Mixed hyperlipidemia: Secondary | ICD-10-CM

## 2022-02-09 DIAGNOSIS — I739 Peripheral vascular disease, unspecified: Secondary | ICD-10-CM | POA: Diagnosis not present

## 2022-02-09 DIAGNOSIS — Z6825 Body mass index (BMI) 25.0-25.9, adult: Secondary | ICD-10-CM

## 2022-02-09 DIAGNOSIS — I10 Essential (primary) hypertension: Secondary | ICD-10-CM

## 2022-02-09 DIAGNOSIS — R69 Illness, unspecified: Secondary | ICD-10-CM | POA: Diagnosis not present

## 2022-02-09 NOTE — Patient Instructions (Signed)
Hypertension, Adult ?Hypertension is another name for high blood pressure. High blood pressure forces your heart to work harder to pump blood. This can cause problems over time. ?There are two numbers in a blood pressure reading. There is a top number (systolic) over a bottom number (diastolic). It is best to have a blood pressure that is below 120/80. ?What are the causes? ?The cause of this condition is not known. Some other conditions can lead to high blood pressure. ?What increases the risk? ?Some lifestyle factors can make you more likely to develop high blood pressure: ?Smoking. ?Not getting enough exercise or physical activity. ?Being overweight. ?Having too much fat, sugar, calories, or salt (sodium) in your diet. ?Drinking too much alcohol. ?Other risk factors include: ?Having any of these conditions: ?Heart disease. ?Diabetes. ?High cholesterol. ?Kidney disease. ?Obstructive sleep apnea. ?Having a family history of high blood pressure and high cholesterol. ?Age. The risk increases with age. ?Stress. ?What are the signs or symptoms? ?High blood pressure may not cause symptoms. Very high blood pressure (hypertensive crisis) may cause: ?Headache. ?Fast or uneven heartbeats (palpitations). ?Shortness of breath. ?Nosebleed. ?Vomiting or feeling like you may vomit (nauseous). ?Changes in how you see. ?Very bad chest pain. ?Feeling dizzy. ?Seizures. ?How is this treated? ?This condition is treated by making healthy lifestyle changes, such as: ?Eating healthy foods. ?Exercising more. ?Drinking less alcohol. ?Your doctor may prescribe medicine if lifestyle changes do not help enough and if: ?Your top number is above 130. ?Your bottom number is above 80. ?Your personal target blood pressure may vary. ?Follow these instructions at home: ?Eating and drinking ? ?If told, follow the DASH eating plan. To follow this plan: ?Fill one half of your plate at each meal with fruits and vegetables. ?Fill one fourth of your plate  at each meal with whole grains. Whole grains include whole-wheat pasta, brown rice, and whole-grain bread. ?Eat or drink low-fat dairy products, such as skim milk or low-fat yogurt. ?Fill one fourth of your plate at each meal with low-fat (lean) proteins. Low-fat proteins include fish, chicken without skin, eggs, beans, and tofu. ?Avoid fatty meat, cured and processed meat, or chicken with skin. ?Avoid pre-made or processed food. ?Limit the amount of salt in your diet to less than 1,500 mg each day. ?Do not drink alcohol if: ?Your doctor tells you not to drink. ?You are pregnant, may be pregnant, or are planning to become pregnant. ?If you drink alcohol: ?Limit how much you have to: ?0-1 drink a day for women. ?0-2 drinks a day for men. ?Know how much alcohol is in your drink. In the [place] one drink equals one 12 oz bottle of beer (355 mL), one 5 oz glass of wine (148 mL), or one 1? oz glass of hard liquor (44 mL). ?Lifestyle ? ?Work with your doctor to stay at a healthy weight or to lose weight. Ask your doctor what the best weight is for you. ?Get at least 30 minutes of exercise that causes your heart to beat faster (aerobic exercise) most days of the week. This may include walking, swimming, or biking. ?Get at least 30 minutes of exercise that strengthens your muscles (resistance exercise) at least 3 days a week. This may include lifting weights or doing Pilates. ?Do not smoke or use any products that contain nicotine or tobacco. If you need help quitting, ask your doctor. ?Check your blood pressure at home as told by your doctor. ?Keep all follow-up visits. ?Medicines ?Take over-the-counter and prescription medicines   only as told by your doctor. Follow directions carefully. ?Do not skip doses of blood pressure medicine. The medicine does not work as well if you skip doses. Skipping doses also puts you at risk for problems. ?Ask your doctor about side effects or reactions to medicines that you should watch  for. ?Contact a doctor if: ?You think you are having a reaction to the medicine you are taking. ?You have headaches that keep coming back. ?You feel dizzy. ?You have swelling in your ankles. ?You have trouble with your vision. ?Get help right away if: ?You get a very bad headache. ?You start to feel mixed up (confused). ?You feel weak or numb. ?You feel faint. ?You have very bad pain in your: ?Chest. ?Belly (abdomen). ?You vomit more than once. ?You have trouble breathing. ?These symptoms may be an emergency. Get help right away. Call 911. ?Do not wait to see if the symptoms will go away. ?Do not drive yourself to the hospital. ?Summary ?Hypertension is another name for high blood pressure. ?High blood pressure forces your heart to work harder to pump blood. ?For most people, a normal blood pressure is less than 120/80. ?Making healthy choices can help lower blood pressure. If your blood pressure does not get lower with healthy choices, you may need to take medicine. ?This information is not intended to replace advice given to you by your health care provider. Make sure you discuss any questions you have with your health care provider. ?Document Revised: 07/09/2021 Document Reviewed: 07/09/2021 ?Elsevier Patient Education ? 2023 Elsevier Inc. ? ?

## 2022-02-09 NOTE — Progress Notes (Signed)
?I,Jameka J Llittleton,acting as a scribe for Robyn N Sanders, MD.,have documented all relevant documentation on the behalf of Robyn N Sanders, MD,as directed by  Robyn N Sanders, MD while in the presence of Robyn N Sanders, MD.  ?This visit occurred during the SARS-CoV-2 public health emergency.  Safety protocols were in place, including screening questions prior to the visit, additional usage of staff PPE, and extensive cleaning of exam room while observing appropriate contact time as indicated for disinfecting solutions. ? ?Subjective:  ?  ? Patient ID: Mary Krueger , female    DOB: 08/28/1946 , 76 y.o.   MRN: [id number] ? ? ?Chief Complaint  ?Patient presents with  ? Hypertension  ? ? ?HPI ? ?She reports today for BP check and anxiety. She reports compliance with metoprolol. States she feels fine. Unfortunately, she has had several deaths in her family. Her ex-husband, sister and brother. She is currently in grief counseling w/ [facility]. She has no other new concerns at this time.  ? ?Hypertension ?This is a chronic problem. The current episode started more than 1 year ago. The problem has been gradually improving since onset. The problem is controlled. Associated symptoms include anxiety. Pertinent negatives include no blurred vision, chest pain, palpitations or shortness of breath. Past treatments include beta blockers. The current treatment provides moderate improvement.   ? ?Past Medical History:  ?Diagnosis Date  ? Anxiety   ? Chronic  ? Breast mass, left 2005  ? fatty on U/S  ? Cataract   ? Fibroid 1987  ? TAH/USO  ? Glaucoma   ? Hyperlipidemia   ? Uses hearing aid   ? Bilateral   ? Vitamin D deficiency   ?  ? ?Family History  ?Problem Relation Age of Onset  ? Diabetes Mother   ? Prostate cancer Father   ? Colon cancer Father   ? Diabetes Brother   ? Prostate cancer Brother   ? Colon polyps Neg Hx   ? Esophageal cancer Neg Hx   ? Stomach cancer Neg Hx   ? Rectal cancer Neg Hx   ? ? ? ?Current  Outpatient Medications:  ?  calcium-vitamin D (OSCAL WITH D) 250-125 MG-UNIT tablet, Take 1 tablet by mouth daily., Disp: , Rfl:  ?  latanoprost (XALATAN) 0.005 % ophthalmic solution, , Disp: , Rfl:  ?  meloxicam (MOBIC) 15 MG tablet, Take 15 mg by mouth as needed. , Disp: , Rfl:  ?  metoprolol succinate (TOPROL-XL) 25 MG 24 hr tablet, TAKE ONE TABLET BY MOUTH EVERY EVENING, Disp: 90 tablet, Rfl: 0 ?  PARoxetine (PAXIL) 40 MG tablet, TAKE ONE TABLET BY MOUTH DAILY, Disp: 90 tablet, Rfl: 1 ?  PRALUENT 150 MG/ML SOAJ, INJECT 150MG UNDER THE SKIN EVERY 14 DAYS, Disp: 2 mL, Rfl: 12 ?  Vitamin D, Ergocalciferol, (DRISDOL) 1.25 MG (50000 UNIT) CAPS capsule, TAKE 1 CAPSULE BY MOUTH 2 TIMES PER WEEK, Disp: 24 capsule, Rfl: 1  ? ?Allergies  ?Allergen Reactions  ? Atorvastatin Other (See Comments)  ?  myalgias  ? Rosuvastatin Other (See Comments)  ?  myalgias  ?  ? ?Review of Systems  ?Constitutional: Negative.   ?Eyes:  Negative for blurred vision.  ?Respiratory: Negative.  Negative for shortness of breath.   ?Cardiovascular: Negative.  Negative for chest pain and palpitations.  ?Gastrointestinal: Negative.   ?Neurological: Negative.   ?Psychiatric/Behavioral: Negative.     ? ?Today's Vitals  ? 02/09/22 1116  ?BP: 110/64  ?Pulse: 74  ?Temp: 99 ?  F (37.2 ?C)  ?Weight: 145 lb 6.4 oz (66 kg)  ?Height: 5' 3.4" (1.61 m)  ?PainSc: 0-No pain  ? ?Body mass index is 25.43 kg/m?.  ?Wt Readings from Last 3 Encounters:  ?02/09/22 145 lb 6.4 oz (66 kg)  ?08/12/21 144 lb 9.6 oz (65.6 kg)  ?04/10/21 145 lb 3.2 oz (65.9 kg)  ?  ?Objective:  ?Physical Exam ?Vitals and nursing note reviewed.  ?Constitutional:   ?   Appearance: Normal appearance.  ?HENT:  ?   Head: Normocephalic and atraumatic.  ?Eyes:  ?   Extraocular Movements: Extraocular movements intact.  ?Cardiovascular:  ?   Rate and Rhythm: Normal rate and regular rhythm.  ?   Heart sounds: Normal heart sounds.  ?Pulmonary:  ?   Effort: Pulmonary effort is normal.  ?   Breath sounds:  Normal breath sounds.  ?Skin: ?   General: Skin is warm.  ?Neurological:  ?   General: No focal deficit present.  ?   Mental Status: She is alert.  ?Psychiatric:     ?   Mood and Affect: Mood normal.     ?   Behavior: Behavior normal.  ?   ?Assessment And Plan:  ?   ?1. Essential hypertension ?Comments: Chronic, well controlled. She is encouraged to follow low sodium diet. I will check renal function today.  ?- CMP14+EGFR ? ?2. Generalized anxiety disorder ?Comments: Chronic, well controlled w/ paroxetine.  ? ?3. Peripheral arterial disease ([place]) ?Comments: As per Cardiology, ABIs studies done 03/2021 with results of 0.74 on left and 0.89 on the right. She denies claudication. C/w ASA, statin and regular exercise.  ? ?4. Mixed hyperlipidemia ?Comments: Chronic, she is on statin therapy. I will check non-fasting lipid panel and ALT today. She is encouraged to live heart healthy lifestyle.  ?- Lipid panel ?- CMP14+EGFR ? ?5. BMI 25.0-25.9,adult ?Comments: She is encouraged to aim for at least 150 minutes of exercise per week.  ?  ?Patient was given opportunity to ask questions. Patient verbalized understanding of the plan and was able to repeat key elements of the plan. All questions were answered to their satisfaction.  ? ?I, Maximino Greenland, MD, have reviewed all documentation for this visit. The documentation on 02/09/22 for the exam, diagnosis, procedures, and orders are all accurate and complete.  ? ?IF YOU HAVE BEEN REFERRED TO A SPECIALIST, IT MAY TAKE 1-2 WEEKS TO SCHEDULE/PROCESS THE REFERRAL. IF YOU HAVE NOT HEARD FROM US/SPECIALIST IN TWO WEEKS, PLEASE GIVE [place] A CALL AT [phone].  ? ?THE PATIENT IS ENCOURAGED TO PRACTICE SOCIAL DISTANCING DUE TO THE COVID-19 PANDEMIC.   ?

## 2022-02-10 LAB — LIPID PANEL
Chol/HDL Ratio: 3 ratio (ref 0.0–4.4)
Cholesterol, Total: 198 mg/dL (ref 100–199)
HDL: 66 mg/dL (ref >39)
LDL Chol Calc (NIH): 115 mg/dL — ABNORMAL HIGH (ref 0–99)
Triglycerides: 93 mg/dL (ref 0–149)
VLDL Cholesterol Cal: 17 mg/dL (ref 5–40)

## 2022-02-10 LAB — CMP14+EGFR
ALT: 12 IU/L (ref 0–32)
AST: 17 IU/L (ref 0–40)
Albumin/Globulin Ratio: 1.9 (ref 1.2–2.2)
Albumin: 4.4 g/dL (ref 3.7–4.7)
Alkaline Phosphatase: 71 IU/L (ref 44–121)
BUN/Creatinine Ratio: 18 (ref 12–28)
BUN: 15 mg/dL (ref 8–27)
Bilirubin Total: 0.3 mg/dL (ref 0.0–1.2)
CO2: 28 mmol/L (ref 20–29)
Calcium: 9.4 mg/dL (ref 8.7–10.3)
Chloride: 103 mmol/L (ref 96–106)
Creatinine, Ser: 0.84 mg/dL (ref 0.57–1.00)
Globulin, Total: 2.3 g/dL (ref 1.5–4.5)
Glucose: 93 mg/dL (ref 70–99)
Potassium: 4.5 mmol/L (ref 3.5–5.2)
Sodium: 142 mmol/L (ref 134–144)
Total Protein: 6.7 g/dL (ref 6.0–8.5)
eGFR: 72 mL/min/{1.73_m2} (ref >59)

## 2022-02-14 ENCOUNTER — Encounter: Payer: Self-pay | Admitting: Internal Medicine

## 2022-02-23 ENCOUNTER — Ambulatory Visit
Admission: RE | Admit: 2022-02-23 | Discharge: 2022-02-23 | Disposition: A | Discharge status: Home / Self Care | Payer: Medicare HMO | Source: Ambulatory Visit | Attending: Obstetrics & Gynecology | Admitting: Obstetrics & Gynecology

## 2022-02-23 DIAGNOSIS — Z1231 Encounter for screening mammogram for malignant neoplasm of breast: Secondary | ICD-10-CM

## 2022-03-16 ENCOUNTER — Telehealth: Payer: Self-pay | Admitting: Internal Medicine

## 2022-03-16 NOTE — Telephone Encounter (Signed)
Left message for patient to call back and schedule Medicare Annual Wellness Visit (AWV) either virtually or in office.  Left both my jabber number [phone] and office number    Last AWV ;01/07/21 please schedule at anytime with [facility]     

## 2022-03-22 ENCOUNTER — Telehealth: Payer: Medicare HMO | Admitting: Nurse Practitioner

## 2022-03-23 DIAGNOSIS — R059 Cough, unspecified: Secondary | ICD-10-CM | POA: Diagnosis not present

## 2022-03-23 DIAGNOSIS — U071 COVID-19: Secondary | ICD-10-CM | POA: Diagnosis not present

## 2022-03-27 ENCOUNTER — Other Ambulatory Visit: Payer: Self-pay

## 2022-03-27 ENCOUNTER — Observation Stay (HOSPITAL_BASED_OUTPATIENT_CLINIC_OR_DEPARTMENT_OTHER)
Admission: EM | Admit: 2022-03-27 | Discharge: 2022-03-28 | Disposition: A | Discharge status: Home / Self Care | Payer: Medicare HMO | Attending: Student | Admitting: Student

## 2022-03-27 ENCOUNTER — Encounter (HOSPITAL_BASED_OUTPATIENT_CLINIC_OR_DEPARTMENT_OTHER): Payer: Self-pay | Admitting: Emergency Medicine

## 2022-03-27 ENCOUNTER — Emergency Department (HOSPITAL_BASED_OUTPATIENT_CLINIC_OR_DEPARTMENT_OTHER): Payer: Medicare HMO

## 2022-03-27 DIAGNOSIS — E871 Hypo-osmolality and hyponatremia: Secondary | ICD-10-CM | POA: Insufficient documentation

## 2022-03-27 DIAGNOSIS — R531 Weakness: Principal | ICD-10-CM

## 2022-03-27 DIAGNOSIS — U071 COVID-19: Secondary | ICD-10-CM | POA: Diagnosis not present

## 2022-03-27 DIAGNOSIS — E782 Mixed hyperlipidemia: Secondary | ICD-10-CM | POA: Diagnosis present

## 2022-03-27 DIAGNOSIS — I10 Essential (primary) hypertension: Secondary | ICD-10-CM

## 2022-03-27 DIAGNOSIS — F411 Generalized anxiety disorder: Secondary | ICD-10-CM | POA: Diagnosis present

## 2022-03-27 DIAGNOSIS — R112 Nausea with vomiting, unspecified: Secondary | ICD-10-CM | POA: Diagnosis not present

## 2022-03-27 DIAGNOSIS — R059 Cough, unspecified: Secondary | ICD-10-CM

## 2022-03-27 DIAGNOSIS — Z79899 Other long term (current) drug therapy: Secondary | ICD-10-CM | POA: Insufficient documentation

## 2022-03-27 LAB — COMPREHENSIVE METABOLIC PANEL
ALT: 12 U/L (ref 0–44)
AST: 16 U/L (ref 15–41)
Albumin: 4 g/dL (ref 3.5–5.0)
Alkaline Phosphatase: 66 U/L (ref 38–126)
Anion gap: 11 (ref 5–15)
BUN: 8 mg/dL (ref 8–23)
CO2: 29 mmol/L (ref 22–32)
Calcium: 10 mg/dL (ref 8.9–10.3)
Chloride: 90 mmol/L — ABNORMAL LOW (ref 98–111)
Creatinine, Ser: 0.79 mg/dL (ref 0.44–1.00)
GFR, Estimated: >60 mL/min (ref >60)
Glucose, Bld: 124 mg/dL — ABNORMAL HIGH (ref 70–99)
Potassium: 4.2 mmol/L (ref 3.5–5.1)
Sodium: 130 mmol/L — ABNORMAL LOW (ref 135–145)
Total Bilirubin: 0.5 mg/dL (ref 0.3–1.2)
Total Protein: 7.5 g/dL (ref 6.5–8.1)

## 2022-03-27 LAB — CBC WITH DIFFERENTIAL/PLATELET
Abs Immature Granulocytes: 0.03 10*3/uL (ref 0.00–0.07)
Basophils Absolute: 0 10*3/uL (ref 0.0–0.1)
Basophils Relative: 0 %
Eosinophils Absolute: 0 10*3/uL (ref 0.0–0.5)
Eosinophils Relative: 0 %
HCT: 41.2 % (ref 36.0–46.0)
Hemoglobin: 14.1 g/dL (ref 12.0–15.0)
Immature Granulocytes: 0 %
Lymphocytes Relative: 11 %
Lymphs Abs: 1 10*3/uL (ref 0.7–4.0)
MCH: 30.3 pg (ref 26.0–34.0)
MCHC: 34.2 g/dL (ref 30.0–36.0)
MCV: 88.6 fL (ref 80.0–100.0)
Monocytes Absolute: 0.4 10*3/uL (ref 0.1–1.0)
Monocytes Relative: 4 %
Neutro Abs: 7.2 10*3/uL (ref 1.7–7.7)
Neutrophils Relative %: 85 %
Platelets: 371 10*3/uL (ref 150–400)
RBC: 4.65 MIL/uL (ref 3.87–5.11)
RDW: 11.9 % (ref 11.5–15.5)
WBC: 8.6 10*3/uL (ref 4.0–10.5)
nRBC: 0 % (ref 0.0–0.2)

## 2022-03-27 LAB — LACTIC ACID, PLASMA: Lactic Acid, Venous: 1 mmol/L (ref 0.5–1.9)

## 2022-03-27 MED ORDER — LACTATED RINGERS IV BOLUS
1000.0000 mL | Freq: Once | INTRAVENOUS | Status: AC
  Administered 2022-03-27: 1000 mL via INTRAVENOUS

## 2022-03-27 MED ORDER — PROCHLORPERAZINE EDISYLATE 10 MG/2ML IJ SOLN
10.0000 mg | Freq: Four times a day (QID) | INTRAMUSCULAR | Status: DC | PRN
Start: 2022-03-27 — End: 2022-03-28

## 2022-03-27 MED ORDER — LORAZEPAM 2 MG/ML IJ SOLN: 0.5000 mg | Freq: Three times a day (TID) | INTRAMUSCULAR | Status: DC | PRN

## 2022-03-27 MED ORDER — SODIUM CHLORIDE 0.9 % IV BOLUS
500.0000 mL | Freq: Once | INTRAVENOUS | Status: AC
  Administered 2022-03-27: 500 mL via INTRAVENOUS

## 2022-03-27 MED ORDER — ASCORBIC ACID 500 MG PO TABS
500.0000 mg | ORAL_TABLET | Freq: Every day | ORAL | Status: DC
  Administered 2022-03-27: 500 mg via ORAL
  Filled 2022-03-27: qty 1

## 2022-03-27 MED ORDER — HYDROCOD POLI-CHLORPHE POLI ER 10-8 MG/5ML PO SUER: 5.0000 mL | Freq: Two times a day (BID) | ORAL | Status: DC | PRN

## 2022-03-27 MED ORDER — IPRATROPIUM-ALBUTEROL 20-100 MCG/ACT IN AERS: 1.0000 | INHALATION_SPRAY | Freq: Four times a day (QID) | RESPIRATORY_TRACT | Status: DC | PRN

## 2022-03-27 MED ORDER — HYDRALAZINE HCL 20 MG/ML IJ SOLN: 10.0000 mg | Freq: Three times a day (TID) | INTRAMUSCULAR | Status: DC | PRN

## 2022-03-27 MED ORDER — ZINC SULFATE 220 (50 ZN) MG PO CAPS
220.0000 mg | ORAL_CAPSULE | Freq: Every day | ORAL | Status: DC
  Administered 2022-03-27: 220 mg via ORAL
  Filled 2022-03-27 (×2): qty 1

## 2022-03-27 MED ORDER — GUAIFENESIN-DM 100-10 MG/5ML PO SYRP: 10.0000 mL | ORAL_SOLUTION | ORAL | Status: DC | PRN

## 2022-03-27 MED ORDER — ENOXAPARIN SODIUM 40 MG/0.4ML IJ SOSY
40.0000 mg | PREFILLED_SYRINGE | INTRAMUSCULAR | Status: DC
  Administered 2022-03-27: 40 mg via SUBCUTANEOUS
  Filled 2022-03-27: qty 0.4

## 2022-03-27 MED ORDER — SODIUM CHLORIDE 0.9 % IV SOLN: INTRAVENOUS | Status: DC

## 2022-03-27 MED ORDER — ACETAMINOPHEN 325 MG PO TABS
650.0000 mg | ORAL_TABLET | Freq: Once | ORAL | Status: AC
  Administered 2022-03-27: 650 mg via ORAL
  Filled 2022-03-27: qty 2

## 2022-03-28 DIAGNOSIS — R051 Acute cough: Secondary | ICD-10-CM

## 2022-03-28 DIAGNOSIS — R531 Weakness: Secondary | ICD-10-CM | POA: Diagnosis not present

## 2022-03-28 DIAGNOSIS — F411 Generalized anxiety disorder: Secondary | ICD-10-CM

## 2022-03-28 DIAGNOSIS — I10 Essential (primary) hypertension: Secondary | ICD-10-CM

## 2022-03-28 DIAGNOSIS — R69 Illness, unspecified: Secondary | ICD-10-CM | POA: Diagnosis not present

## 2022-03-28 DIAGNOSIS — R112 Nausea with vomiting, unspecified: Secondary | ICD-10-CM

## 2022-03-28 DIAGNOSIS — U071 COVID-19: Secondary | ICD-10-CM | POA: Diagnosis not present

## 2022-03-28 DIAGNOSIS — E782 Mixed hyperlipidemia: Secondary | ICD-10-CM | POA: Diagnosis not present

## 2022-03-28 DIAGNOSIS — R059 Cough, unspecified: Secondary | ICD-10-CM

## 2022-03-28 LAB — C-REACTIVE PROTEIN: CRP: 5.3 mg/dL — ABNORMAL HIGH (ref <1.0)

## 2022-03-28 LAB — CBC WITH DIFFERENTIAL/PLATELET
Abs Immature Granulocytes: 0.02 10*3/uL (ref 0.00–0.07)
Basophils Absolute: 0 10*3/uL (ref 0.0–0.1)
Basophils Relative: 0 %
Eosinophils Absolute: 0.1 10*3/uL (ref 0.0–0.5)
Eosinophils Relative: 1 %
HCT: 36.5 % (ref 36.0–46.0)
Hemoglobin: 12.4 g/dL (ref 12.0–15.0)
Immature Granulocytes: 0 %
Lymphocytes Relative: 15 %
Lymphs Abs: 1.1 10*3/uL (ref 0.7–4.0)
MCH: 31.2 pg (ref 26.0–34.0)
MCHC: 34 g/dL (ref 30.0–36.0)
MCV: 91.9 fL (ref 80.0–100.0)
Monocytes Absolute: 0.5 10*3/uL (ref 0.1–1.0)
Monocytes Relative: 6 %
Neutro Abs: 6 10*3/uL (ref 1.7–7.7)
Neutrophils Relative %: 78 %
Platelets: 327 10*3/uL (ref 150–400)
RBC: 3.97 MIL/uL (ref 3.87–5.11)
RDW: 11.9 % (ref 11.5–15.5)
WBC: 7.6 10*3/uL (ref 4.0–10.5)
nRBC: 0 % (ref 0.0–0.2)

## 2022-03-28 LAB — COMPREHENSIVE METABOLIC PANEL
ALT: 15 U/L (ref 0–44)
AST: 18 U/L (ref 15–41)
Albumin: 3.2 g/dL — ABNORMAL LOW (ref 3.5–5.0)
Alkaline Phosphatase: 54 U/L (ref 38–126)
Anion gap: 7 (ref 5–15)
BUN: 6 mg/dL — ABNORMAL LOW (ref 8–23)
CO2: 29 mmol/L (ref 22–32)
Calcium: 8.6 mg/dL — ABNORMAL LOW (ref 8.9–10.3)
Chloride: 104 mmol/L (ref 98–111)
Creatinine, Ser: 0.71 mg/dL (ref 0.44–1.00)
GFR, Estimated: >60 mL/min (ref >60)
Glucose, Bld: 115 mg/dL — ABNORMAL HIGH (ref 70–99)
Potassium: 3.9 mmol/L (ref 3.5–5.1)
Sodium: 140 mmol/L (ref 135–145)
Total Bilirubin: 0.4 mg/dL (ref 0.3–1.2)
Total Protein: 6.3 g/dL — ABNORMAL LOW (ref 6.5–8.1)

## 2022-03-28 LAB — D-DIMER, QUANTITATIVE: D-Dimer, Quant: 0.42 ug/mL-FEU (ref 0.00–0.50)

## 2022-03-28 MED ORDER — ONDANSETRON HCL 4 MG PO TABS: 4.0000 mg | ORAL_TABLET | Freq: Three times a day (TID) | ORAL | 0 refills | Status: AC | PRN

## 2022-03-28 MED ORDER — GUAIFENESIN ER 600 MG PO TB12: 600.0000 mg | ORAL_TABLET | Freq: Two times a day (BID) | ORAL | 0 refills | Status: AC

## 2022-03-28 MED ORDER — METOPROLOL TARTRATE 25 MG PO TABS: 25.0000 mg | ORAL_TABLET | Freq: Two times a day (BID) | ORAL | Status: DC

## 2022-03-28 MED ORDER — LABETALOL HCL 5 MG/ML IV SOLN: 10.0000 mg | INTRAVENOUS | Status: DC | PRN

## 2022-03-28 MED ORDER — LATANOPROST 0.005 % OP SOLN
1.0000 [drp] | Freq: Every day | OPHTHALMIC | Status: DC
  Filled 2022-03-28: qty 2.5

## 2022-03-28 MED ORDER — PAROXETINE HCL 20 MG PO TABS
40.0000 mg | ORAL_TABLET | Freq: Every day | ORAL | Status: DC
  Filled 2022-03-28: qty 2

## 2022-03-29 ENCOUNTER — Telehealth: Payer: Self-pay

## 2022-03-29 ENCOUNTER — Ambulatory Visit (HOSPITAL_COMMUNITY): Payer: Medicare HMO

## 2022-03-31 ENCOUNTER — Other Ambulatory Visit (HOSPITAL_COMMUNITY): Payer: Self-pay | Admitting: Family

## 2022-03-31 DIAGNOSIS — I739 Peripheral vascular disease, unspecified: Secondary | ICD-10-CM

## 2022-04-01 LAB — CULTURE, BLOOD (SINGLE)
Culture: NO GROWTH
Special Requests: ADEQUATE

## 2022-04-05 ENCOUNTER — Telehealth: Payer: Self-pay

## 2022-04-05 NOTE — Chronic Care Management (AMB) (Signed)
    Chronic Care Management Pharmacy Assistant   Name: Mary Krueger  MRN: [id number] DOB: [birth year]   Reason for Encounter: Disease State/ General   Recent office visits:  02-09-2022 Glendale Chard, MD. LDL= 115. Patient reported not taking aspirin.  Recent consult visits:  03-23-2022 Rolin Barry, [place] ([facility]). Visit for positive covid test. START Paxlovid dose pack.  Hospital visits:  Medication Reconciliation was completed by comparing discharge summary, patient's EMR and Pharmacy list, and upon discussion with patient.  Admitted to the hospital on 03-27-2022 due to Covid.  Discharge date was 03-28-2022. Discharged from [facility].    New?Medications Started at [facility] Discharge:?? Guaifensin 600 mg twice daily Zofran 4 mg every 8 hours as needed  Medication Changes at Hospital Discharge: None  Medications Discontinued at Hospital Discharge: None  Medications that remain the same after Hospital Discharge:??  -All other medications will remain the same.    Medications: Outpatient Encounter Medications as of 04/05/2022  Medication Sig   albuterol (VENTOLIN HFA) 108 (90 Base) MCG/ACT inhaler Inhale 2 puffs into the lungs every 6 (six) hours as needed for wheezing or shortness of breath.   calcium-vitamin D (OSCAL WITH D) 250-125 MG-UNIT tablet Take 1 tablet by mouth daily.   latanoprost (XALATAN) 0.005 % ophthalmic solution Place 1 drop into both eyes at bedtime.   metoprolol succinate (TOPROL-XL) 25 MG 24 hr tablet TAKE ONE TABLET BY MOUTH EVERY EVENING   PARoxetine (PAXIL) 40 MG tablet TAKE ONE TABLET BY MOUTH DAILY   PRALUENT 150 MG/ML SOAJ INJECT '150MG'$  UNDER THE SKIN EVERY 14 DAYS   Vitamin D, Ergocalciferol, (DRISDOL) 1.25 MG (50000 UNIT) CAPS capsule TAKE 1 CAPSULE BY MOUTH 2 TIMES PER WEEK   No facility-administered encounter medications on file as of 04/05/2022.    04-05-2022: 1st attempt left VM 04-08-2022: 2nd attempt left VM 04-12-2022:  3rd attempt left VM  Care Gaps: None  Star Rating Drugs: None  [facility] Clinical Pharmacist Assistant [phone]

## 2022-04-08 ENCOUNTER — Telehealth: Payer: Self-pay | Admitting: Internal Medicine

## 2022-04-08 NOTE — Telephone Encounter (Signed)
Left message for patient to call back and schedule Medicare Annual Wellness Visit (AWV) in office.   If not able to come in office, please offer to do virtually or by telephone.  Left office number.  Last AWV:01/07/2021  Please schedule at anytime with Nurse Health Advisor.

## 2022-04-19 ENCOUNTER — Other Ambulatory Visit: Payer: Self-pay | Admitting: Internal Medicine

## 2022-04-20 ENCOUNTER — Ambulatory Visit (HOSPITAL_COMMUNITY)
Admission: RE | Admit: 2022-04-20 | Discharge: 2022-04-20 | Disposition: A | Discharge status: Home / Self Care | Payer: Medicare HMO | Source: Ambulatory Visit | Attending: Cardiology | Admitting: Cardiology

## 2022-04-20 DIAGNOSIS — I739 Peripheral vascular disease, unspecified: Secondary | ICD-10-CM | POA: Insufficient documentation

## 2022-04-22 ENCOUNTER — Telehealth (HOSPITAL_BASED_OUTPATIENT_CLINIC_OR_DEPARTMENT_OTHER): Payer: Self-pay

## 2022-04-22 NOTE — Telephone Encounter (Addendum)
Called results to patient and left results on VM (ok per DPR), instructions left to call office back if patient has any questions!      ----- Message from Loel Dubonnet, NP sent at 04/21/2022  2:11 PM EDT ----- Bilateral ABI normal with no evidence of blockage in arteries of legs. This is actually improved compared to previous. Great result!

## 2022-04-27 ENCOUNTER — Other Ambulatory Visit (HOSPITAL_COMMUNITY)
Admission: RE | Admit: 2022-04-27 | Discharge: 2022-04-27 | Disposition: A | Discharge status: Home / Self Care | Payer: Medicare HMO | Source: Ambulatory Visit | Attending: Obstetrics & Gynecology | Admitting: Obstetrics & Gynecology

## 2022-04-27 ENCOUNTER — Ambulatory Visit (INDEPENDENT_AMBULATORY_CARE_PROVIDER_SITE_OTHER): Payer: Medicare HMO | Admitting: Obstetrics & Gynecology

## 2022-04-27 ENCOUNTER — Encounter (HOSPITAL_BASED_OUTPATIENT_CLINIC_OR_DEPARTMENT_OTHER): Payer: Self-pay | Admitting: Obstetrics & Gynecology

## 2022-04-27 VITALS — BP 161/68 | HR 77 | Ht 64.0 in | Wt 138.0 lb

## 2022-04-27 DIAGNOSIS — Z113 Encounter for screening for infections with a predominantly sexual mode of transmission: Secondary | ICD-10-CM | POA: Insufficient documentation

## 2022-04-27 DIAGNOSIS — H5202 Hypermetropia, left eye: Secondary | ICD-10-CM | POA: Diagnosis not present

## 2022-04-27 DIAGNOSIS — R69 Illness, unspecified: Secondary | ICD-10-CM | POA: Diagnosis not present

## 2022-04-27 NOTE — Progress Notes (Signed)
GYNECOLOGY  VISIT  CC:   discuss options  HPI: 76 y.o. G0P0000 Divorced White or Caucasian female here for recommendations after breaking off relationship with partner.  They were discussing marriage.  She has been SA but this was painful.  She would like some suggestions for treatment now and in the future.  OTC and prescription products discussed.  STI testing discussed.  Will do today.  Denies vaginal bleeding or discharge.   Past Medical History:  Diagnosis Date   Anxiety    Chronic   Breast mass, left 2005   fatty on U/S   Cataract    Fibroid 1987   TAH/USO   Glaucoma    Hyperlipidemia    Uses hearing aid    Bilateral    Vitamin D deficiency     MEDS:   Current Outpatient Medications on File Prior to Visit  Medication Sig Dispense Refill   albuterol (VENTOLIN HFA) 108 (90 Base) MCG/ACT inhaler Inhale 2 puffs into the lungs every 6 (six) hours as needed for wheezing or shortness of breath.     calcium-vitamin D (OSCAL WITH D) 250-125 MG-UNIT tablet Take 1 tablet by mouth daily.     latanoprost (XALATAN) 0.005 % ophthalmic solution Place 1 drop into both eyes at bedtime.     metoprolol succinate (TOPROL-XL) 25 MG 24 hr tablet TAKE ONE TABLET BY MOUTH EVERY EVENING 90 tablet 0   PARoxetine (PAXIL) 40 MG tablet TAKE ONE TABLET BY MOUTH DAILY 90 tablet 1   PRALUENT 150 MG/ML SOAJ INJECT '150MG'$  UNDER THE SKIN EVERY 14 DAYS 2 mL 12   Vitamin D, Ergocalciferol, (DRISDOL) 1.25 MG (50000 UNIT) CAPS capsule TAKE 1 CAPSULE BY MOUTH 2 TIMES PER WEEK 24 capsule 1   No current facility-administered medications on file prior to visit.    ALLERGIES: Atorvastatin and Rosuvastatin  SH:  divorced, non smoker  Review of Systems  Constitutional: Negative.   Genitourinary: Negative.     PHYSICAL EXAMINATION:    BP (!) 161/68 (BP Location: Right Arm, Patient Position: Sitting, Cuff Size: Normal)   Pulse 77   Ht '5\' 4"'$  (1.626 m)   Wt 138 lb (62.6 kg)   LMP 10/04/1984   BMI 23.69 kg/m      General appearance: alert, cooperative and appears stated age Abdomen: soft, non-tender; bowel sounds normal; no masses,  no organomegaly Lymph:  no inguinal LAD noted  Pelvic: External genitalia:  no lesions              Urethra:  normal appearing urethra with no masses, tenderness or lesions              Bartholins and Skenes: normal                 Vagina: normal appearing vagina with normal color and discharge, no lesions              Cervix: absent              Chaperone declined today.  Assessment/Plan: 1. Screening examination for STD (sexually transmitted disease) - Cervicovaginal ancillary only( [place]) - RPR+HBsAg+HIV - Hepatitis C antibody

## 2022-04-28 ENCOUNTER — Telehealth: Payer: Self-pay | Admitting: Internal Medicine

## 2022-04-28 LAB — CERVICOVAGINAL ANCILLARY ONLY
Chlamydia: NEGATIVE
Comment: NEGATIVE
Comment: NEGATIVE
Comment: NORMAL
Neisseria Gonorrhea: NEGATIVE
Trichomonas: NEGATIVE

## 2022-04-28 LAB — HEPATITIS C ANTIBODY: Hep C Virus Ab: NONREACTIVE

## 2022-04-28 LAB — RPR+HBSAG+HIV
HIV Screen 4th Generation wRfx: NONREACTIVE
Hepatitis B Surface Ag: NEGATIVE
RPR Ser Ql: NONREACTIVE

## 2022-04-28 NOTE — Telephone Encounter (Signed)
Left message for patient to call back and schedule Medicare Annual Wellness Visit (AWV) either virtually or in office.  Left both my jabber number [phone] and office number    Last AWV 01/07/21  ; please schedule at anytime with [facility]

## 2022-05-05 ENCOUNTER — Telehealth: Payer: Self-pay | Admitting: Internal Medicine

## 2022-05-05 NOTE — Telephone Encounter (Signed)
Left message for patient to call back and schedule Medicare Annual Wellness Visit (AWV) either virtually or in office.  Left both my jabber number [phone] and office number    Last AWV ;01/07/21 please schedule at anytime with [facility]

## 2022-05-20 ENCOUNTER — Telehealth: Payer: Self-pay | Admitting: Internal Medicine

## 2022-05-20 NOTE — Telephone Encounter (Signed)
Left message for patient to call back and schedule Medicare Annual Wellness Visit (AWV) either virtually or in office.  Left both my jabber number [phone] and office number    Last AWV 01/07/21 ; please schedule at anytime with [facility]

## 2022-06-03 ENCOUNTER — Telehealth: Payer: Self-pay | Admitting: Internal Medicine

## 2022-06-03 NOTE — Telephone Encounter (Signed)
Left message for patient to call back and schedule Medicare Annual Wellness Visit (AWV) either virtually or in office.  Left both my jabber number [phone] and office number    Last AWV  01/07/21 ; please schedule at anytime with [facility]

## 2022-06-04 ENCOUNTER — Encounter (HOSPITAL_BASED_OUTPATIENT_CLINIC_OR_DEPARTMENT_OTHER): Payer: Self-pay | Admitting: Cardiovascular Disease

## 2022-06-04 ENCOUNTER — Ambulatory Visit (INDEPENDENT_AMBULATORY_CARE_PROVIDER_SITE_OTHER): Payer: Medicare HMO | Admitting: Cardiovascular Disease

## 2022-06-04 VITALS — BP 122/70 | HR 76 | Ht 64.0 in | Wt 144.0 lb

## 2022-06-04 DIAGNOSIS — E782 Mixed hyperlipidemia: Secondary | ICD-10-CM

## 2022-06-04 NOTE — Progress Notes (Signed)
Cardiology Office Note   Date:  06/04/2022   ID:  Mary Krueger, DOB [birth year], MRN [id number]  PCP:  Glendale Chard, MD  Cardiologist:   Skeet Latch, MD   No chief complaint on file.    History of Present Illness: Mary Krueger is a 76 y.o. 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  Mary Krueger reports that she has had hyperlipidemia for many years.  She previously tried atorvastatin, rosuvastatin, and simvastatin but did not tolerate them due to myalgias.  It was noted that her diet was poor and she wanted to work on this prior to starting any other additional medications.  Given her level of hyperlipidemia is not likely that she has familial hyperlipidemia and recommended she start a PCSK9 inhibitor.  At her last visit she was doing well and tolerating Praluent.  She saw Laurann Montana, NP 04/2021 and discussed abnormal ABIs that were done for screening.  Her result was 0.74 on the left and 0.89 on the right.  She was walking without any symptoms of claudication.  On reapeat 04/2022, the R was 1.08 and L 1.73.  Mary Krueger, reports feeling good overall with no pain in her legs or cramping during walking. She denies experiencing any chest pain or pressure. Her breathing has been good. She goes for walks four to five times a week, walking for two miles or 45 minutes each time.  Mary Krueger confirms that she is no longer taking Praluent due to the grant ending and her insurance not covering the cost, which is approximately $500 a month. She ran out of the medication about three months ago.  The patient had a recent COVID infection and was treated with Paxlovid. She reports experiencing adverse effects from the medication, including dehydration from diarrhea and losing her voice for about a week. She believes there may be some residual effects from the infection and the  medication.   Past Medical History:  Diagnosis Date   Anxiety    Chronic   Breast mass, left 2005   fatty on U/S   Cataract    Fibroid 1987   TAH/USO   Glaucoma    Hyperlipidemia    Uses hearing aid    Bilateral    Vitamin D deficiency     Past Surgical History:  Procedure Laterality Date   BALLOON DILATION N/A 03/05/2019   Procedure: BALLOON DILATION;  Surgeon: Mauri Pole, MD;  Location: [facility] ENDOSCOPY;  Service: Endoscopy;  Laterality: N/A;  TTS balloon dilation   CATARACT EXTRACTION W/ INTRAOCULAR LENS IMPLANT Bilateral 12/2015 01/2016   COSMETIC SURGERY     Mini face lift   ESOPHAGEAL MANOMETRY N/A 03/05/2019   Procedure: ESOPHAGEAL MANOMETRY (EM);  Surgeon: Mauri Pole, MD;  Location: [facility];  Service: Endoscopy;  Laterality: N/A;   ESOPHAGOGASTRODUODENOSCOPY  01/2019   ESOPHAGOGASTRODUODENOSCOPY (EGD) WITH PROPOFOL N/A 01/17/2019   Procedure: ESOPHAGOGASTRODUODENOSCOPY (EGD) WITH PROPOFOL;  Surgeon: Jerene Bears, MD;  Location: [facility] ENDOSCOPY;  Service: Gastroenterology;  Laterality: N/A;   ESOPHAGOGASTRODUODENOSCOPY (EGD) WITH PROPOFOL N/A 03/05/2019   Procedure: ESOPHAGOGASTRODUODENOSCOPY (EGD) WITH PROPOFOL;  Surgeon: Mauri Pole, MD;  Location: [facility];  Service: Endoscopy;  Laterality: N/A;   EYE SURGERY  06/2012   eye lift   FOREIGN BODY REMOVAL  01/17/2019   Procedure: FOREIGN BODY REMOVAL;  Surgeon: Jerene Bears, MD;  Location: [facility] END[facility]COPY;  Service: Gastroenterology;;  LIPOMA EXCISION Left 2009   left arm   STOMACH SURGERY  1997   Sphincter   TONSILLECTOMY AND ADENOIDECTOMY  1972   TOTAL ABDOMINAL HYSTERECTOMY W/ BILATERAL SALPINGOOPHORECTOMY     TRIGGER FINGER RELEASE     right     Current Outpatient Medications  Medication Sig Dispense Refill   calcium-vitamin D (OSCAL WITH D) 250-125 MG-UNIT tablet Take 1 tablet by mouth daily.     latanoprost (XALATAN) 0.005 % ophthalmic solution Place 1 drop into both eyes at bedtime.      metoprolol succinate (TOPROL-XL) 25 MG 24 hr tablet TAKE ONE TABLET BY MOUTH EVERY EVENING 90 tablet 0   PARoxetine (PAXIL) 40 MG tablet TAKE ONE TABLET BY MOUTH DAILY 90 tablet 1   No current facility-administered medications for this visit.    Allergies:   Paxlovid [nirmatrelvir-ritonavir], Atorvastatin, and Rosuvastatin    Social History:  The patient  reports that she quit smoking about 42 years ago. Her smoking use included cigarettes. She started smoking about 60 years ago. She has a 27.00 pack-year smoking history. She has never used smokeless tobacco. She reports that she does not drink alcohol and does not use drugs.   Family History:  The patient's family history includes Colon cancer in her father; Diabetes in her brother and mother; Prostate cancer in her brother and father.    ROS:  Please see the history of present illness.   Otherwise, review of systems are positive for none.   All other systems are reviewed and negative.    PHYSICAL EXAM: VS:  BP 122/70   Pulse 76   Ht '5\' 4"'$  (1.626 m)   Wt 144 lb (65.3 kg)   LMP 10/04/1984   BMI 24.72 kg/m  , BMI Body mass index is 24.72 kg/m. GENERAL:  Well appearing HEENT: Pupils equal round and reactive, fundi not visualized, oral mucosa unremarkable NECK:  No jugular venous distention, waveform within normal limits, carotid upstroke brisk and symmetric, no bruits LUNGS:  Clear to auscultation bilaterally HEART:  RRR.  PMI not displaced or sustained,S1 and S2 within normal limits, no S3, no S4, no clicks, no rubs, no murmurs ABD:  Flat, positive bowel sounds normal in frequency in pitch, no bruits, no rebound, no guarding, no midline pulsatile mass, no hepatomegaly, no splenomegaly EXT:  2 plus pulses throughout, no edema, no cyanosis no clubbing SKIN:  No rashes no nodules NEURO:  Cranial nerves II through XII grossly intact, motor grossly intact throughout PSYCH:  Cognitively intact, oriented to person place and time  EKG:   EKG is ordered today. 06/19/2020: Sinus rhythm.  Rate 68 bpm.  Cannot rule out prior septal infarct.   Recent Labs: 03/28/2022: ALT 15; BUN 6; Creatinine, Ser 0.71; Hemoglobin 12.4; Platelets 327; Potassium 3.9; Sodium 140    Lipid Panel    Component Value Date/Time   CHOL 198 02/09/2022 1212   TRIG 93 02/09/2022 1212   HDL 66 02/09/2022 1212   CHOLHDL 3.0 02/09/2022 1212   CHOLHDL 4.4 CALC 10/24/2008 0827   VLDL 20 10/24/2008 0827   LDLCALC 115 (H) 02/09/2022 1212   LDLDIRECT 166.6 10/24/2008 0827      Wt Readings from Last 3 Encounters:  06/04/22 144 lb (65.3 kg)  04/27/22 138 lb (62.6 kg)  03/27/22 130 lb (59 kg)      ASSESSMENT AND PLAN:  # Peripheral artery disease:    - Patient reports no current pain or cramping in legs during walks    -  ABI results improved compared to last year    - Continue current exercise regimen (walking 2 miles, 4-5 times per week)    - PharmD to try and get back on PCSK9 inhibitor  #  Dyslipidemia:    - Patient discontinued Praluent due to cost and insurance coverage issues    - Schedule appointment with PharmD to explore options for resuming Praluent or switching to Repatha, and assist with prior authorizations  #  Hypertension:    - Blood pressure well-controlled on metoprolol   Current medicines are reviewed at length with the patient today.  The patient does not have concerns regarding medicines.  The following changes have been made:  no change  Labs/ tests ordered today include:   Orders Placed This Encounter  Procedures   AMB Referral to [facility] Pharm-D     Disposition:   FU with Delmont Prosch C. Oval Linsey, MD, [facility] in 2 years.  Laurann Montana, NP in 1 year    Signed, Gracin Soohoo C. Oval Linsey, MD, [facility]  06/04/2022 10:36 AM    [facility]

## 2022-06-04 NOTE — Patient Instructions (Signed)
Medication Instructions:  Your physician recommends that you continue on your current medications as directed. Please refer to the Current Medication list given to you today.   *If you need a refill on your cardiac medications before your next appointment, please call your pharmacy*  Lab Work: NONE  Testing/Procedures: NONE  Follow-Up: At [facility], you and your health needs are our priority.  As part of our continuing mission to provide you with exceptional heart care, we have created designated Provider Care Teams.  These Care Teams include your primary Cardiologist (physician) and Advanced Practice Providers (APPs -  Physician Assistants and Nurse Practitioners) who all work together to provide you with the care you need, when you need it.  We recommend signing up for the patient portal called "MyChart".  Sign up information is provided on this After Visit Summary.  MyChart is used to connect with patients for Virtual Visits (Telemedicine).  Patients are able to view lab/test results, encounter notes, upcoming appointments, etc.  Non-urgent messages can be sent to your provider as well.   To learn more about what you can do with MyChart, go to [email or web address].    Your next appointment:   12 month(s)  The format for your next appointment:   In Person  Provider:   Laurann Montana, NP   DR [facility] IN 2 YEARS   PHARM D FOR LIPID MANAGEMENT

## 2022-06-24 ENCOUNTER — Encounter: Payer: Self-pay | Admitting: Pharmacist

## 2022-06-24 ENCOUNTER — Ambulatory Visit: Payer: Medicare HMO | Attending: Cardiology | Admitting: Pharmacist

## 2022-06-24 DIAGNOSIS — E7800 Pure hypercholesterolemia, unspecified: Secondary | ICD-10-CM

## 2022-06-24 DIAGNOSIS — I739 Peripheral vascular disease, unspecified: Secondary | ICD-10-CM

## 2022-06-24 NOTE — Patient Instructions (Addendum)
It was nice meeting you today  I will complete our sections of the Praluent patient assistance forms.  Please bring in a copy of your income documentation  I have given you a sample of Repatha.    Once you receive your shipment, we will recheck your cholesterol levels in 2-3 months  Please call or message with any questions  Karren Cobble, PharmD, [place], [place], [place], [place], [place], [address] Phone: [phone], Fax: [phone]

## 2022-06-24 NOTE — Progress Notes (Signed)
Patient ID: Mary Krueger                 DOB: 1946/01/26                    MRN: [id number]      HPI: Mary Krueger is a 76 y.o. female patient referred to lipid clinic by Dr Oval Linsey. PMH is significant for HTN, PAD, and HLD.  Lipids previously well controlled on Praluent. However her assistance grant has run out and now her copay is 500 dollars.   Current Medications: N/A  Intolerances:  Simvastatin Rosuvastatin Atorvastatin  LDL goal: <70  Labs: TC 198, Trigs 93, HDL 66, LDL 115 (02/09/22)  Past Medical History:  Diagnosis Date   Anxiety    Chronic   Breast mass, left 2005   fatty on U/S   Cataract    Fibroid 1987   TAH/USO   Glaucoma    Hyperlipidemia    Uses hearing aid    Bilateral    Vitamin D deficiency     Current Outpatient Medications on File Prior to Visit  Medication Sig Dispense Refill   calcium-vitamin D (OSCAL WITH D) 250-125 MG-UNIT tablet Take 1 tablet by mouth daily.     latanoprost (XALATAN) 0.005 % ophthalmic solution Place 1 drop into both eyes at bedtime.     metoprolol succinate (TOPROL-XL) 25 MG 24 hr tablet TAKE ONE TABLET BY MOUTH EVERY EVENING 90 tablet 0   PARoxetine (PAXIL) 40 MG tablet TAKE ONE TABLET BY MOUTH DAILY 90 tablet 1   No current facility-administered medications on file prior to visit.    Allergies  Allergen Reactions   Paxlovid [Nirmatrelvir-Ritonavir] Diarrhea   Simvastatin Other (See Comments)    myalgias   Atorvastatin Other (See Comments)    myalgias   Rosuvastatin Other (See Comments)    myalgias    Assessment/Plan:  1. Hyperlipidemia - Patient's last LDL 115 however she has not been on an lipid lowering therapy since her assistance grant ran out.  Gave patient sample of Repatha '140mg'$  and printed Praluent patient assistance paperwork.  Will complete and have provider sign and send to manufacturer. Instructed patient to bring in income documentation.  Patient grateful for assistance.  Karren Cobble,  PharmD, [place], [place], [place], [place], [place], [address] Phone: [phone], Fax: [phone]    [id number] 01/01/25

## 2022-07-01 ENCOUNTER — Telehealth: Payer: Self-pay | Admitting: Internal Medicine

## 2022-07-01 NOTE — Telephone Encounter (Signed)
Left message for patient to call back and schedule Medicare Annual Wellness Visit (AWV) either virtually or in office. Left  my jabber number [phone]   Last AWV  01/07/21 please schedule with Nurse Health Adviser   45 min for awv-i and in office appointments 30 min for awv-s  phone/virtual appointments   

## 2022-07-14 ENCOUNTER — Other Ambulatory Visit: Payer: Self-pay | Admitting: Internal Medicine

## 2022-07-20 ENCOUNTER — Telehealth: Payer: Self-pay | Admitting: Internal Medicine

## 2022-07-20 NOTE — Telephone Encounter (Signed)
Left message for patient to call back and schedule Medicare Annual Wellness Visit (AWV) either virtually or in office. Left  my jabber number [phone]   Last AWV  01/07/21 please schedule with Nurse Health Adviser   45 min for awv-i and in office appointments 30 min for awv-s  phone/virtual appointments   

## 2022-07-21 ENCOUNTER — Telehealth: Payer: Self-pay | Admitting: Pharmacist

## 2022-07-21 NOTE — Telephone Encounter (Signed)
Patient assistance application for Praluent sent to manufacturer

## 2022-07-25 ENCOUNTER — Other Ambulatory Visit: Payer: Self-pay | Admitting: Internal Medicine

## 2022-08-03 ENCOUNTER — Telehealth: Payer: Self-pay | Admitting: Internal Medicine

## 2022-08-03 NOTE — Telephone Encounter (Signed)
Left message for patient to call back and schedule Medicare Annual Wellness Visit (AWV) either virtually or in office. Left  my Herbie Drape number [phone]   Last AWV  01/07/21 please schedule with Nurse Health Adviser   45 min for awv-i and in office appointments 30 min for awv-s  phone/virtual appointments

## 2022-08-05 ENCOUNTER — Ambulatory Visit (HOSPITAL_BASED_OUTPATIENT_CLINIC_OR_DEPARTMENT_OTHER): Payer: Medicare HMO | Admitting: Obstetrics & Gynecology

## 2022-08-05 ENCOUNTER — Encounter (HOSPITAL_BASED_OUTPATIENT_CLINIC_OR_DEPARTMENT_OTHER): Payer: Self-pay | Admitting: Obstetrics & Gynecology

## 2022-08-05 VITALS — BP 134/61 | HR 79 | Ht 64.0 in | Wt 141.4 lb

## 2022-08-05 DIAGNOSIS — R195 Other fecal abnormalities: Secondary | ICD-10-CM

## 2022-08-05 DIAGNOSIS — R102 Pelvic and perineal pain: Secondary | ICD-10-CM

## 2022-08-05 NOTE — Progress Notes (Signed)
GYNECOLOGY  VISIT  CC:  pelvic pressure, concerns about possible prolapse  HPI: 76 y.o. G0P0000 Divorced White or Caucasian female here for complaint of lower pelvic pressure/discomfort that has been about 2 months.  She is having some issues looser stools and liquidly diarrhea.  She denies dysuria but does feel some discomfort with emptying her bladder.  Is worried about prolapse.  Denies urinary incontinence.  Has hx of hysterectomy and one ovary removed.  Denies vaginal bleeding or discharge.  Last colonoscopy 2019 with Dr. Silverio Decamp and follow up 5 years recommended.  This would be due next year by this recommendation.  Denies seeing any blood in her stool   Past Medical History:  Diagnosis Date   Anxiety    Chronic   Breast mass, left 2005   fatty on U/S   Cataract    Fibroid 1987   TAH/USO   Glaucoma    Hyperlipidemia    Uses hearing aid    Bilateral    Vitamin D deficiency     MEDS:   Current Outpatient Medications on File Prior to Visit  Medication Sig Dispense Refill   calcium-vitamin D (OSCAL WITH D) 250-125 MG-UNIT tablet Take 1 tablet by mouth daily.     latanoprost (XALATAN) 0.005 % ophthalmic solution Place 1 drop into both eyes at bedtime.     metoprolol succinate (TOPROL-XL) 25 MG 24 hr tablet TAKE ONE TABLET BY MOUTH EVERY EVENING 90 tablet 0   PARoxetine (PAXIL) 40 MG tablet TAKE ONE TABLET BY MOUTH DAILY 90 tablet 1   No current facility-administered medications on file prior to visit.    ALLERGIES: Paxlovid [nirmatrelvir-ritonavir], Simvastatin, Atorvastatin, and Rosuvastatin  SH:  divorced, non smoker  Review of Systems  Constitutional: Negative.   Genitourinary:        Pelvic discomfort/pressure    PHYSICAL EXAMINATION:    BP 134/61 (BP Location: Right Arm, Patient Position: Sitting, Cuff Size: Large)   Pulse 79   Ht '5\' 4"'$  (1.626 m) Comment: REported  Wt 141 lb 6.4 oz (64.1 kg)   LMP 10/04/1984   BMI 24.27 kg/m     General appearance:  alert, cooperative and appears stated age Lymph:  no inguinal LAD noted  Pelvic: External genitalia:  no lesions              Urethra:  normal appearing urethra with no masses, tenderness or lesions              Bartholins and Skenes: normal                 Vagina: normal appearing vagina with normal color and discharge, no lesion, no prolapse present              Cervix: absent              Bimanual Exam:  Uterus:  uterus absent              Adnexa: no mass, fullness, tenderness               Chaperone, Octaviano Batty, CMA, was present for exam.  Assessment/Plan: 1. Pelvic pressure in female - will check urine.  Pt reassured no prolapse present.   - Urine Culture - CT ABDOMEN PELVIS W CONTRAST; Future  2. Change in stool - if CT is negative for abnormal findings, feel pt will need follow up with GI

## 2022-08-07 LAB — URINE CULTURE: Organism ID, Bacteria: NO GROWTH

## 2022-08-19 ENCOUNTER — Ambulatory Visit
Admission: RE | Admit: 2022-08-19 | Discharge: 2022-08-19 | Disposition: A | Discharge status: Home / Self Care | Payer: Medicare HMO | Source: Ambulatory Visit | Attending: Obstetrics & Gynecology | Admitting: Obstetrics & Gynecology

## 2022-08-19 DIAGNOSIS — R195 Other fecal abnormalities: Secondary | ICD-10-CM

## 2022-08-19 DIAGNOSIS — K769 Liver disease, unspecified: Secondary | ICD-10-CM | POA: Diagnosis not present

## 2022-08-19 DIAGNOSIS — N2889 Other specified disorders of kidney and ureter: Secondary | ICD-10-CM | POA: Diagnosis not present

## 2022-08-19 DIAGNOSIS — K5730 Diverticulosis of large intestine without perforation or abscess without bleeding: Secondary | ICD-10-CM | POA: Diagnosis not present

## 2022-08-19 DIAGNOSIS — N281 Cyst of kidney, acquired: Secondary | ICD-10-CM | POA: Diagnosis not present

## 2022-08-19 DIAGNOSIS — R102 Pelvic and perineal pain: Secondary | ICD-10-CM

## 2022-08-19 MED ORDER — IOPAMIDOL (ISOVUE-300) INJECTION 61%
100.0000 mL | Freq: Once | INTRAVENOUS | Status: AC | PRN
  Administered 2022-08-19: 100 mL via INTRAVENOUS

## 2022-09-02 ENCOUNTER — Ambulatory Visit (INDEPENDENT_AMBULATORY_CARE_PROVIDER_SITE_OTHER): Payer: Medicare HMO | Admitting: Internal Medicine

## 2022-09-02 ENCOUNTER — Encounter: Payer: Self-pay | Admitting: Internal Medicine

## 2022-09-02 VITALS — BP 136/70 | HR 85 | Temp 98.3°F | Ht 64.0 in | Wt 144.0 lb

## 2022-09-02 DIAGNOSIS — I10 Essential (primary) hypertension: Secondary | ICD-10-CM | POA: Diagnosis not present

## 2022-09-02 DIAGNOSIS — E782 Mixed hyperlipidemia: Secondary | ICD-10-CM | POA: Diagnosis not present

## 2022-09-02 DIAGNOSIS — F411 Generalized anxiety disorder: Secondary | ICD-10-CM | POA: Diagnosis not present

## 2022-09-02 DIAGNOSIS — R69 Illness, unspecified: Secondary | ICD-10-CM | POA: Diagnosis not present

## 2022-09-02 DIAGNOSIS — E559 Vitamin D deficiency, unspecified: Secondary | ICD-10-CM

## 2022-09-02 DIAGNOSIS — Z0000 Encounter for general adult medical examination without abnormal findings: Secondary | ICD-10-CM

## 2022-09-02 DIAGNOSIS — Z23 Encounter for immunization: Secondary | ICD-10-CM | POA: Diagnosis not present

## 2022-09-02 LAB — POCT URINALYSIS DIPSTICK
Bilirubin, UA: NEGATIVE
Glucose, UA: NEGATIVE
Ketones, UA: NEGATIVE
Leukocytes, UA: NEGATIVE
Nitrite, UA: NEGATIVE
Protein, UA: NEGATIVE
Spec Grav, UA: 1.02 (ref 1.010–1.025)
Urobilinogen, UA: 0.2 E.U./dL
pH, UA: 5.5 (ref 5.0–8.0)

## 2022-09-02 NOTE — Patient Instructions (Addendum)
Mediterranean Diet A Mediterranean diet refers to food and lifestyle choices that are based on the traditions of countries located on the [employer]. It focuses on eating more fruits, vegetables, whole grains, beans, nuts, seeds, and heart-healthy fats, and eating less dairy, meat, eggs, and processed foods with added sugar, salt, and fat. This way of eating has been shown to help prevent certain conditions and improve outcomes for people who have chronic diseases, like kidney disease and heart disease. What are tips for following this plan? Reading food labels Check the serving size of packaged foods. For foods such as rice and pasta, the serving size refers to the amount of cooked product, not dry. Check the total fat in packaged foods. Avoid foods that have saturated fat or trans fats. Check the ingredient list for added sugars, such as corn syrup. Shopping  Buy a variety of foods that offer a balanced diet, including: Fresh fruits and vegetables (produce). Grains, beans, nuts, and seeds. Some of these may be available in unpackaged forms or large amounts (in bulk). Fresh seafood. Poultry and eggs. Low-fat dairy products. Buy whole ingredients instead of prepackaged foods. Buy fresh fruits and vegetables in-season from local farmers markets. Buy plain frozen fruits and vegetables. If you do not have access to quality fresh seafood, buy precooked frozen shrimp or canned fish, such as tuna, salmon, or sardines. Stock your pantry so you always have certain foods on hand, such as olive oil, canned tuna, canned tomatoes, rice, pasta, and beans. Cooking Cook foods with extra-virgin olive oil instead of using butter or other vegetable oils. Have meat as a side dish, and have vegetables or grains as your main dish. This means having meat in small portions or adding small amounts of meat to foods like pasta or stew. Use beans or vegetables instead of meat in common dishes like chili or  lasagna. Experiment with different cooking methods. Try roasting, broiling, steaming, and sauting vegetables. Add frozen vegetables to soups, stews, pasta, or rice. Add nuts or seeds for added healthy fats and plant protein at each meal. You can add these to yogurt, salads, or vegetable dishes. Marinate fish or vegetables using olive oil, lemon juice, garlic, and fresh herbs. Meal planning Plan to eat one vegetarian meal one day each week. Try to work up to two vegetarian meals, if possible. Eat seafood two or more times a week. Have healthy snacks readily available, such as: Vegetable sticks with hummus. Greek yogurt. Fruit and nut trail mix. Eat balanced meals throughout the week. This includes: Fruit: 2-3 servings a day. Vegetables: 4-5 servings a day. Low-fat dairy: 2 servings a day. Fish, poultry, or lean meat: 1 serving a day. Beans and legumes: 2 or more servings a week. Nuts and seeds: 1-2 servings a day. Whole grains: 6-8 servings a day. Extra-virgin olive oil: 3-4 servings a day. Limit red meat and sweets to only a few servings a month. Lifestyle  Cook and eat meals together with your family, when possible. Drink enough fluid to keep your urine pale yellow. Be physically active every day. This includes: Aerobic exercise like running or swimming. Leisure activities like gardening, walking, or housework. Get 7-8 hours of sleep each night. If recommended by your health care provider, drink red wine in moderation. This means 1 glass a day for nonpregnant women and 2 glasses a day for men. A glass of wine equals 5 oz (150 mL). What foods should I eat? Fruits Apples. Apricots. Avocado. Berries. Bananas. Cherries. Dates.  Figs. Grapes. Lemons. Melon. Oranges. Peaches. Plums. Pomegranate. Vegetables Artichokes. Beets. Broccoli. Cabbage. Carrots. Eggplant. Green beans. Chard. Kale. Spinach. Onions. Leeks. Peas. Squash. Tomatoes. Peppers. Radishes. Grains Whole-grain pasta. Brown  rice. Bulgur wheat. Polenta. Couscous. Whole-wheat bread. [place]. Meats and other proteins Beans. Almonds. Sunflower seeds. Pine nuts. Peanuts. [place]. Salmon. Scallops. Shrimp. [place]. Tilapia. Clams. Oysters. Eggs. Poultry without skin. Dairy Low-fat milk. Cheese. Greek yogurt. Fats and oils Extra-virgin olive oil. Avocado oil. Grapeseed oil. Beverages Water. Red wine. Herbal tea. Sweets and desserts Greek yogurt with honey. Baked apples. Poached pears. Trail mix. Seasonings and condiments Basil. Cilantro. Coriander. Cumin. Mint. Parsley. Sage. Rosemary. Tarragon. Garlic. Oregano. Thyme. Pepper. Balsamic vinegar. Tahini. Hummus. Tomato sauce. Olives. Mushrooms. The items listed above may not be a complete list of foods and beverages you can eat. Contact a dietitian for more information. What foods should I limit? This is a list of foods that should be eaten rarely or only on special occasions. Fruits Fruit canned in syrup. Vegetables Deep-fried potatoes (french fries). Grains Prepackaged pasta or rice dishes. Prepackaged cereal with added sugar. Prepackaged snacks with added sugar. Meats and other proteins Beef. Pork. Lamb. Poultry with skin. Hot dogs. [place] Salines. Dairy Ice cream. Sour cream. Whole milk. Fats and oils Butter. Canola oil. Vegetable oil. Beef fat (tallow). Lard. Beverages Juice. Sugar-sweetened soft drinks. Beer. Liquor and spirits. Sweets and desserts Cookies. Cakes. Pies. Candy. Seasonings and condiments Mayonnaise. Pre-made sauces and marinades. The items listed above may not be a complete list of foods and beverages you should limit. Contact a dietitian for more information. Summary The Mediterranean diet includes both food and lifestyle choices. Eat a variety of fresh fruits and vegetables, beans, nuts, seeds, and whole grains. Limit the amount of red meat and sweets that you eat. If recommended by your health care provider, drink red wine in moderation.  This means 1 glass a day for nonpregnant women and 2 glasses a day for men. A glass of wine equals 5 oz (150 mL). This information is not intended to replace advice given to you by your health care provider. Make sure you discuss any questions you have with your health care provider. Document Revised: 10/26/2019 Document Reviewed: 08/23/2019 Elsevier Patient Education  [place] Maintenance, Female Adopting a healthy lifestyle and getting preventive care are important in promoting health and wellness. Ask your health care provider about: The right schedule for you to have regular tests and exams. Things you can do on your own to prevent diseases and keep yourself healthy. What should I know about diet, weight, and exercise? Eat a healthy diet  Eat a diet that includes plenty of vegetables, fruits, low-fat dairy products, and lean protein. Do not eat a lot of foods that are high in solid fats, added sugars, or sodium. Maintain a healthy weight Body mass index (BMI) is used to identify weight problems. It estimates body fat based on height and weight. Your health care provider can help determine your BMI and help you achieve or maintain a healthy weight. Get regular exercise Get regular exercise. This is one of the most important things you can do for your health. Most adults should: Exercise for at least 150 minutes each week. The exercise should increase your heart rate and make you sweat (moderate-intensity exercise). Do strengthening exercises at least twice a week. This is in addition to the moderate-intensity exercise. Spend less time sitting. Even light physical activity can be beneficial. Watch cholesterol and blood  lipids Have your blood tested for lipids and cholesterol at 78 years of age, then have this test every 5 years. Have your cholesterol levels checked more often if: Your lipid or cholesterol levels are high. You are older than 77 years of age. You are at  high risk for heart disease. What should I know about cancer screening? Depending on your health history and family history, you may need to have cancer screening at various ages. This may include screening for: Breast cancer. Cervical cancer. Colorectal cancer. Skin cancer. Lung cancer. What should I know about heart disease, diabetes, and high blood pressure? Blood pressure and heart disease High blood pressure causes heart disease and increases the risk of stroke. This is more likely to develop in people who have high blood pressure readings or are overweight. Have your blood pressure checked: Every 3-5 years if you are 83-11 years of age. Every year if you are 27 years old or older. Diabetes Have regular diabetes screenings. This checks your fasting blood sugar level. Have the screening done: Once every three years after age 76 if you are at a normal weight and have a low risk for diabetes. More often and at a younger age if you are overweight or have a high risk for diabetes. What should I know about preventing infection? Hepatitis B If you have a higher risk for hepatitis B, you should be screened for this virus. Talk with your health care provider to find out if you are at risk for hepatitis B infection. Hepatitis C Testing is recommended for: Everyone born from [birth year] through 1967. Anyone with known risk factors for hepatitis C. Sexually transmitted infections (STIs) Get screened for STIs, including gonorrhea and chlamydia, if: You are sexually active and are younger than 78 years of age. You are older than 78 years of age and your health care provider tells you that you are at risk for this type of infection. Your sexual activity has changed since you were last screened, and you are at increased risk for chlamydia or gonorrhea. Ask your health care provider if you are at risk. Ask your health care provider about whether you are at high risk for HIV. Your health care provider may  recommend a prescription medicine to help prevent HIV infection. If you choose to take medicine to prevent HIV, you should first get tested for HIV. You should then be tested every 3 months for as long as you are taking the medicine. Pregnancy If you are about to stop having your period (premenopausal) and you may become pregnant, seek counseling before you get pregnant. Take 400 to 800 micrograms (mcg) of folic acid every day if you become pregnant. Ask for birth control (contraception) if you want to prevent pregnancy. Osteoporosis and menopause Osteoporosis is a disease in which the bones lose minerals and strength with aging. This can result in bone fractures. If you are 8 years old or older, or if you are at risk for osteoporosis and fractures, ask your health care provider if you should: Be screened for bone loss. Take a calcium or vitamin D supplement to lower your risk of fractures. Be given hormone replacement therapy (HRT) to treat symptoms of menopause. Follow these instructions at home: Alcohol use Do not drink alcohol if: Your health care provider tells you not to drink. You are pregnant, may be pregnant, or are planning to become pregnant. If you drink alcohol: Limit how much you have to: 0-1 drink a day. Know how much  alcohol is in your drink. In the [place] one drink equals one 12 oz bottle of beer (355 mL), one 5 oz glass of wine (148 mL), or one 1 oz glass of hard liquor (44 mL). Lifestyle Do not use any products that contain nicotine or tobacco. These products include cigarettes, chewing tobacco, and vaping devices, such as e-cigarettes. If you need help quitting, ask your health care provider. Do not use street drugs. Do not share needles. Ask your health care provider for help if you need support or information about quitting drugs. General instructions Schedule regular health, dental, and eye exams. Stay current with your vaccines. Tell your health care provider  if: You often feel depressed. You have ever been abused or do not feel safe at home. Summary Adopting a healthy lifestyle and getting preventive care are important in promoting health and wellness. Follow your health care provider's instructions about healthy diet, exercising, and getting tested or screened for diseases. Follow your health care provider's instructions on monitoring your cholesterol and blood pressure. This information is not intended to replace advice given to you by your health care provider. Make sure you discuss any questions you have with your health care provider. Document Revised: 02/09/2021 Document Reviewed: 02/09/2021 Elsevier Patient Education  [place].

## 2022-09-02 NOTE — Progress Notes (Signed)
Barnet Glasgow Martin,acting as a Education administrator for Maximino Greenland, MD.,have documented all relevant documentation on the behalf of Maximino Greenland, MD,as directed by  Maximino Greenland, MD while in the presence of Maximino Greenland, MD.   Subjective:     Patient ID: Mary Krueger , female    DOB: 13-Jan-1946 , 76 y.o.   MRN: [id number]   Chief Complaint  Patient presents with   Annual Exam    HPI  Patient here for HM. She is followed by Dr.Miller for her GYN exams. She reports compliance with meds. She denies headaches, chest pain and shortness of breath.      BP Readings from Last 3 Encounters: 09/02/22 : (!) 140/70 08/05/22 : 134/61 06/04/22 : 122/70    Hypertension This is a chronic problem. The current episode started more than 1 year ago. The problem has been gradually improving since onset. The problem is controlled. Pertinent negatives include no blurred vision, chest pain, headaches, orthopnea or shortness of breath. Risk factors for coronary artery disease include post-menopausal state. Past treatments include beta blockers. There are no compliance problems.      Past Medical History:  Diagnosis Date   Anxiety    Chronic   Breast mass, left 2005   fatty on U/S   Cataract    Fibroid 1987   TAH/USO   Glaucoma    Hyperlipidemia    Uses hearing aid    Bilateral    Vitamin D deficiency      Family History  Problem Relation Age of Onset   Diabetes Mother    Prostate cancer Father    Colon cancer Father    Diabetes Brother    Prostate cancer Brother    Colon polyps Neg Hx    Esophageal cancer Neg Hx    Stomach cancer Neg Hx    Rectal cancer Neg Hx      Current Outpatient Medications:    calcium-vitamin D (OSCAL WITH D) 250-125 MG-UNIT tablet, Take 1 tablet by mouth daily., Disp: , Rfl:    latanoprost (XALATAN) 0.005 % ophthalmic solution, Place 1 drop into both eyes at bedtime., Disp: , Rfl:    metoprolol succinate (TOPROL-XL) 25 MG 24 hr tablet, TAKE ONE TABLET BY  MOUTH EVERY EVENING, Disp: 90 tablet, Rfl: 0   PARoxetine (PAXIL) 40 MG tablet, TAKE ONE TABLET BY MOUTH DAILY, Disp: 90 tablet, Rfl: 1   Allergies  Allergen Reactions   Paxlovid [Nirmatrelvir-Ritonavir] Diarrhea   Simvastatin Other (See Comments)    myalgias   Atorvastatin Other (See Comments)    myalgias   Rosuvastatin Other (See Comments)    myalgias      The patient states she uses post menopausal status for birth control. Last LMP was Patient's last menstrual period was 10/04/1984.. Negative for Dysmenorrhea. Negative for: breast discharge, breast lump(s), breast pain and breast self exam. Associated symptoms include abnormal vaginal bleeding. Pertinent negatives include abnormal bleeding (hematology), anxiety, decreased libido, depression, difficulty falling sleep, dyspareunia, history of infertility, nocturia, sexual dysfunction, sleep disturbances, urinary incontinence, urinary urgency, vaginal discharge and vaginal itching. Diet regular.The patient states her exercise level is  moderate.  . The patient's tobacco use is:  Social History   Tobacco Use  Smoking Status Former   Packs/day: 1.50   Years: 18.00   Total pack years: 27.00   Types: Cigarettes   Start date: 1963   Quit date: 1981   Years since quitting: 42.9  Smokeless Tobacco Never  Tobacco  Comments   quit in 1981  . She has been exposed to passive smoke. The patient's alcohol use is:  Social History   Substance and Sexual Activity  Alcohol Use No   Alcohol/week: 0.0 standard drinks of alcohol   Review of Systems  Constitutional: Negative.   HENT: Negative.    Eyes: Negative.  Negative for blurred vision.  Respiratory: Negative.  Negative for shortness of breath.   Cardiovascular: Negative.  Negative for chest pain and orthopnea.  Gastrointestinal: Negative.   Endocrine: Negative.   Genitourinary: Negative.   Musculoskeletal: Negative.   Skin: Negative.   Allergic/Immunologic: Negative.    Neurological: Negative.  Negative for headaches.  Hematological: Negative.   Psychiatric/Behavioral: Negative.       Today's Vitals   09/02/22 1008 09/02/22 1019  BP: (!) 140/70 136/70  Pulse: 85   Temp: 98.3 F (36.8 C)   TempSrc: Oral   Weight: 144 lb (65.3 kg)   Height: _0  (1.626 m)   PainSc: 0-No pain    Body mass index is 24.72 kg/m.  Wt Readings from Last 3 Encounters:  09/02/22 144 lb (65.3 kg)  08/05/22 141 lb 6.4 oz (64.1 kg)  06/04/22 144 lb (65.3 kg)    Objective:  Physical Exam Vitals and nursing note reviewed.  Constitutional:      Appearance: Normal appearance.  HENT:     Head: Normocephalic and atraumatic.     Right Ear: Tympanic membrane, ear canal and external ear normal.     Left Ear: Tympanic membrane, ear canal and external ear normal.     Nose:     Comments: Masked     Mouth/Throat:     Comments: Masked  Eyes:     Extraocular Movements: Extraocular movements intact.     Conjunctiva/sclera: Conjunctivae normal.     Pupils: Pupils are equal, round, and reactive to light.  Cardiovascular:     Rate and Rhythm: Normal rate and regular rhythm.     Pulses: Normal pulses.     Heart sounds: Normal heart sounds.  Pulmonary:     Effort: Pulmonary effort is normal.     Breath sounds: Normal breath sounds.  Chest:  Breasts:    Tanner Score is 5.     Right: Normal.     Left: Normal.  Abdominal:     General: Abdomen is flat. Bowel sounds are normal.     Palpations: Abdomen is soft.  Genitourinary:    Comments: deferred Musculoskeletal:        General: Normal range of motion.     Cervical back: Normal range of motion and neck supple.  Skin:    General: Skin is warm and dry.  Neurological:     General: No focal deficit present.     Mental Status: She is alert and oriented to person, place, and time.  Psychiatric:        Mood and Affect: Mood normal.        Behavior: Behavior normal.      Assessment And Plan:     1. Annual physical  exam Comments: A full exam was performed. Importance of monthly self breast exams was discussed with the patient.  PATIENT IS ADVISED TO GET 30-45 MINUTES REGULAR EXERCISE NO LESS THAN FOUR TO FIVE DAYS PER WEEK - BOTH WEIGHTBEARING EXERCISES AND AEROBIC ARE RECOMMENDED.  PATIENT IS ADVISED TO FOLLOW A HEALTHY DIET WITH AT LEAST SIX FRUITS/VEGGIES PER DAY, DECREASE INTAKE OF RED MEAT, AND TO INCREASE FISH INTAKE  TO TWO DAYS PER WEEK.  MEATS/FISH SHOULD NOT BE FRIED, BAKED OR BROILED IS PREFERABLE.  IT IS ALSO IMPORTANT TO CUT BACK ON YOUR SUGAR INTAKE. PLEASE AVOID ANYTHING WITH ADDED SUGAR, CORN SYRUP OR OTHER SWEETENERS. IF YOU MUST USE A SWEETENER, YOU CAN TRY STEVIA. IT IS ALSO IMPORTANT TO AVOID ARTIFICIALLY SWEETENERS AND DIET BEVERAGES. LASTLY, I SUGGEST WEARING SPF 50 SUNSCREEN ON EXPOSED PARTS AND ESPECIALLY WHEN IN THE DIRECT SUNLIGHT FOR AN EXTENDED PERIOD OF TIME.  PLEASE AVOID FAST FOOD RESTAURANTS AND INCREASE YOUR WATER INTAKE.  2. Essential hypertension Comments: Chronic, goal BP<130/80. EKG performed, NSR w/o acute changes. She will c/w Toprol XL 28m daily. Advised to follow low sodium diet.  She agrees to rto in six months for re-evaluation. She is also followed by Cardiology.  - EKG 12-Lead - Microalbumin / creatinine urine ratio - POCT urinalysis dipstick - CMP14+EGFR  3. Generalized anxiety disorder Comments: Chronic, controlled on paroxetine daily.    She will c/w current meds. May also benefit from Mg supplementation.  4. Mixed hyperlipidemia Comments: Chronic, she is statin intolerant. She has been on Praluent in the past. She is unable to afford the medication and states the "grant ran out'. - Lipid panel  5. Vitamin D deficiency Comments: I will check vitamin D level and supplement as needed. - Vitamin D (25 hydroxy)  6. Need for influenza vaccination - Flu Vaccine QUAD High Dose(Fluad)  Patient was given opportunity to ask questions. Patient verbalized  understanding of the plan and was able to repeat key elements of the plan. All questions were answered to their satisfaction.   I, RMaximino Greenland MD, have reviewed all documentation for this visit. The documentation on 09/12/22 for the exam, diagnosis, procedures, and orders are all accurate and complete.  THE PATIENT IS ENCOURAGED TO PRACTICE SOCIAL DISTANCING DUE TO THE COVID-19 PANDEMIC.

## 2022-09-05 LAB — CMP14+EGFR
ALT: 18 IU/L (ref 0–32)
AST: 20 IU/L (ref 0–40)
Albumin/Globulin Ratio: 2 (ref 1.2–2.2)
Albumin: 4.5 g/dL (ref 3.8–4.8)
Alkaline Phosphatase: 74 IU/L (ref 44–121)
BUN/Creatinine Ratio: 15 (ref 12–28)
BUN: 13 mg/dL (ref 8–27)
Bilirubin Total: 0.3 mg/dL (ref 0.0–1.2)
CO2: 24 mmol/L (ref 20–29)
Calcium: 9.6 mg/dL (ref 8.7–10.3)
Chloride: 101 mmol/L (ref 96–106)
Creatinine, Ser: 0.84 mg/dL (ref 0.57–1.00)
Globulin, Total: 2.2 g/dL (ref 1.5–4.5)
Glucose: 91 mg/dL (ref 70–99)
Potassium: 4.3 mmol/L (ref 3.5–5.2)
Sodium: 141 mmol/L (ref 134–144)
Total Protein: 6.7 g/dL (ref 6.0–8.5)
eGFR: 71 mL/min/{1.73_m2} (ref >59)

## 2022-09-05 LAB — LIPID PANEL
Chol/HDL Ratio: 4.8 ratio — ABNORMAL HIGH (ref 0.0–4.4)
Cholesterol, Total: 318 mg/dL — ABNORMAL HIGH (ref 100–199)
HDL: 66 mg/dL (ref >39)
LDL Chol Calc (NIH): 231 mg/dL — ABNORMAL HIGH (ref 0–99)
Triglycerides: 121 mg/dL (ref 0–149)
VLDL Cholesterol Cal: 21 mg/dL (ref 5–40)

## 2022-09-05 LAB — MICROALBUMIN / CREATININE URINE RATIO
Creatinine, Urine: 97.1 mg/dL
Microalb/Creat Ratio: <3 mg/g creat (ref 0–29)
Microalbumin, Urine: <3 ug/mL

## 2022-09-05 LAB — VITAMIN D 25 HYDROXY (VIT D DEFICIENCY, FRACTURES): Vit D, 25-Hydroxy: 38.2 ng/mL (ref 30.0–100.0)

## 2022-09-12 ENCOUNTER — Encounter: Payer: Self-pay | Admitting: Internal Medicine

## 2022-09-23 ENCOUNTER — Other Ambulatory Visit: Payer: Medicare HMO

## 2022-09-30 ENCOUNTER — Telehealth: Payer: Self-pay

## 2022-09-30 NOTE — Telephone Encounter (Signed)
This nurse attempted to call patient three times for scheduled telephonic AWV. Message left that we will call her back to reschedule or she can call the office.

## 2022-10-12 ENCOUNTER — Telehealth: Payer: Self-pay | Admitting: Internal Medicine

## 2022-10-12 NOTE — Telephone Encounter (Signed)
Left message for patient to call back and schedule Medicare Annual Wellness Visit (AWV) either virtually or in office. Left  my Mary Krueger number [phone]   Last AWV 01/07/21 please schedule with Nurse Health Adviser   45 min for awv-i  in office appointments 30 min for awv in office/ phone/virtual appointments

## 2022-10-13 NOTE — Telephone Encounter (Signed)
Returned patients call left message  Patient returned my call 1/10

## 2022-10-22 DIAGNOSIS — J1189 Influenza due to unidentified influenza virus with other manifestations: Secondary | ICD-10-CM | POA: Diagnosis not present

## 2022-11-05 ENCOUNTER — Other Ambulatory Visit: Payer: Self-pay | Admitting: Internal Medicine

## 2022-11-09 ENCOUNTER — Telehealth: Payer: Self-pay | Admitting: Internal Medicine

## 2022-11-09 NOTE — Telephone Encounter (Signed)
Left message for patient to call back and schedule Medicare Annual Wellness Visit (AWV) either virtually or in office. Left  my Herbie Drape number [phone]   Last AWV 01/07/21 please schedule with Nurse Health Adviser   45 min for awv-i  in office appointments 30 min for awv-s & awv-i phone/virtual appointments

## 2022-12-01 DIAGNOSIS — N390 Urinary tract infection, site not specified: Secondary | ICD-10-CM | POA: Diagnosis not present

## 2022-12-01 DIAGNOSIS — R69 Illness, unspecified: Secondary | ICD-10-CM | POA: Diagnosis not present

## 2022-12-01 DIAGNOSIS — I10 Essential (primary) hypertension: Secondary | ICD-10-CM | POA: Diagnosis not present

## 2022-12-03 DIAGNOSIS — E039 Hypothyroidism, unspecified: Secondary | ICD-10-CM | POA: Diagnosis not present

## 2022-12-03 DIAGNOSIS — I10 Essential (primary) hypertension: Secondary | ICD-10-CM | POA: Diagnosis not present

## 2022-12-03 DIAGNOSIS — R69 Illness, unspecified: Secondary | ICD-10-CM | POA: Diagnosis not present

## 2022-12-03 DIAGNOSIS — N390 Urinary tract infection, site not specified: Secondary | ICD-10-CM | POA: Diagnosis not present

## 2022-12-07 DIAGNOSIS — R5383 Other fatigue: Secondary | ICD-10-CM | POA: Diagnosis not present

## 2022-12-07 DIAGNOSIS — E039 Hypothyroidism, unspecified: Secondary | ICD-10-CM | POA: Diagnosis not present

## 2022-12-07 DIAGNOSIS — R69 Illness, unspecified: Secondary | ICD-10-CM | POA: Diagnosis not present

## 2022-12-07 DIAGNOSIS — I10 Essential (primary) hypertension: Secondary | ICD-10-CM | POA: Diagnosis not present

## 2022-12-10 DIAGNOSIS — E039 Hypothyroidism, unspecified: Secondary | ICD-10-CM | POA: Diagnosis not present

## 2022-12-10 DIAGNOSIS — I10 Essential (primary) hypertension: Secondary | ICD-10-CM | POA: Diagnosis not present

## 2022-12-10 DIAGNOSIS — R69 Illness, unspecified: Secondary | ICD-10-CM | POA: Diagnosis not present

## 2022-12-10 DIAGNOSIS — R5383 Other fatigue: Secondary | ICD-10-CM | POA: Diagnosis not present

## 2022-12-15 DIAGNOSIS — E43 Unspecified severe protein-calorie malnutrition: Secondary | ICD-10-CM | POA: Diagnosis not present

## 2022-12-15 DIAGNOSIS — E039 Hypothyroidism, unspecified: Secondary | ICD-10-CM | POA: Diagnosis not present

## 2022-12-15 DIAGNOSIS — R69 Illness, unspecified: Secondary | ICD-10-CM | POA: Diagnosis not present

## 2022-12-15 DIAGNOSIS — F0390 Unspecified dementia without behavioral disturbance: Secondary | ICD-10-CM | POA: Diagnosis not present

## 2022-12-15 DIAGNOSIS — I10 Essential (primary) hypertension: Secondary | ICD-10-CM | POA: Diagnosis not present

## 2023-01-03 DEATH — deceased

## 2023-01-25 ENCOUNTER — Telehealth: Payer: Self-pay | Admitting: Internal Medicine

## 2023-01-25 NOTE — Telephone Encounter (Signed)
Called patient to schedule Medicare Annual Wellness Visit (AWV). Left message for patient to call back and schedule Medicare Annual Wellness Visit (AWV).  Last date of AWV: 01/07/21  Please schedule an appointment at any time with [facility].  If any questions, please contact me at [phone].  Thank you ,  Rudell Cobb AWV direct phone # [phone]

## 2023-02-02 ENCOUNTER — Encounter: Payer: Self-pay | Admitting: Gastroenterology

## 2023-02-03 ENCOUNTER — Other Ambulatory Visit: Payer: Self-pay | Admitting: Internal Medicine

## 2023-02-09 ENCOUNTER — Encounter: Payer: Self-pay | Admitting: Gastroenterology

## 2023-02-10 ENCOUNTER — Telehealth: Payer: Self-pay

## 2023-02-10 NOTE — Telephone Encounter (Signed)
This nurse attempted to call patient in order to perform or schedule an AWV. Message left to call office to schedule appointment.

## 2023-02-23 ENCOUNTER — Ambulatory Visit (AMBULATORY_SURGERY_CENTER): Payer: Medicare HMO

## 2023-02-23 ENCOUNTER — Encounter: Payer: Self-pay | Admitting: Gastroenterology

## 2023-02-23 VITALS — Ht 64.0 in | Wt 145.0 lb

## 2023-02-23 DIAGNOSIS — Z800 Family history of malignant neoplasm of digestive organs: Secondary | ICD-10-CM

## 2023-02-23 MED ORDER — NA SULFATE-K SULFATE-MG SULF 17.5-3.13-1.6 GM/177ML PO SOLN: 1.0000 | Freq: Once | ORAL | 0 refills | Status: AC

## 2023-02-23 NOTE — Progress Notes (Signed)
Pre visit completed via phone call; Patient verified name, DOB, and address;  No egg or soy allergy known to patient;  No issues known to pt with past sedation with any surgeries or procedures; Patient denies ever being told they had issues or difficulty with intubation; No FH of Malignant Hyperthermia; Pt is not on diet pills; Pt is not on home 02  Pt is not on blood thinners;  Pt denies issues with constipation  No A fib or A flutter Have any cardiac testing pending--NO Pt instructed to use [email or web address] or GoodRx for a price reduction on prep   Insurance verified during PV appt=[employer] Medicare  Patient's chart reviewed by Cathlyn Parsons CNRA prior to previsit and patient appropriate for the [facility].  Previsit completed and red dot placed by patient's name on their procedure day (on provider's schedule).    Instructions and GoodRx coupon printed and mailed to patient per her request;

## 2023-03-10 ENCOUNTER — Encounter: Payer: Self-pay | Admitting: Internal Medicine

## 2023-03-10 ENCOUNTER — Ambulatory Visit (INDEPENDENT_AMBULATORY_CARE_PROVIDER_SITE_OTHER): Payer: Medicare HMO | Admitting: Internal Medicine

## 2023-03-10 VITALS — BP 128/76 | HR 88 | Temp 97.9°F | Ht 64.0 in | Wt 144.0 lb

## 2023-03-10 DIAGNOSIS — I739 Peripheral vascular disease, unspecified: Secondary | ICD-10-CM

## 2023-03-10 DIAGNOSIS — E782 Mixed hyperlipidemia: Secondary | ICD-10-CM | POA: Diagnosis not present

## 2023-03-10 DIAGNOSIS — G720 Drug-induced myopathy: Secondary | ICD-10-CM | POA: Diagnosis not present

## 2023-03-10 DIAGNOSIS — F411 Generalized anxiety disorder: Secondary | ICD-10-CM

## 2023-03-10 DIAGNOSIS — T466X5A Adverse effect of antihyperlipidemic and antiarteriosclerotic drugs, initial encounter: Secondary | ICD-10-CM

## 2023-03-10 DIAGNOSIS — M25552 Pain in left hip: Secondary | ICD-10-CM | POA: Diagnosis not present

## 2023-03-10 DIAGNOSIS — I119 Hypertensive heart disease without heart failure: Secondary | ICD-10-CM | POA: Diagnosis not present

## 2023-03-10 DIAGNOSIS — G8929 Other chronic pain: Secondary | ICD-10-CM | POA: Diagnosis not present

## 2023-03-10 LAB — POCT URINALYSIS DIPSTICK
Bilirubin, UA: NEGATIVE
Glucose, UA: NEGATIVE
Ketones, UA: NEGATIVE
Nitrite, UA: NEGATIVE
Protein, UA: NEGATIVE
Spec Grav, UA: 1.025 (ref 1.010–1.025)
Urobilinogen, UA: 0.2 E.U./dL
pH, UA: 6 (ref 5.0–8.0)

## 2023-03-10 MED ORDER — MELOXICAM 15 MG PO TABS: 15.0000 mg | ORAL_TABLET | ORAL | 0 refills | Status: DC | PRN

## 2023-03-10 NOTE — Progress Notes (Signed)
Subjective:  Patient ID: Mary Krueger , female    DOB: 1946/02/03 , 77 y.o.   MRN: [id number]  Chief Complaint  Patient presents with   Hypertension   Hyperlipidemia    HPI  She reports today for BP, cholesterol and anxiety follow up. She reports compliance with medications. Denies headache, chest pain, and SOB.   She reports she has received second shingles shot. She does not remember where or when.     Hypertension This is a chronic problem. The current episode started more than 1 year ago. The problem has been gradually improving since onset. The problem is controlled. Associated symptoms include anxiety. Pertinent negatives include no blurred vision, chest pain, palpitations or shortness of breath. Past treatments include beta blockers. The current treatment provides moderate improvement.     Past Medical History:  Diagnosis Date   Anxiety    Chronic   Arthritis    generalized   Breast mass, left 2005   fatty on U/S   Cataract    bilateral sx   Fibroid 1987   TAH/USO   Glaucoma    on meds   Hyperlipidemia    diet controlled   Uses hearing aid    Bilateral    Vitamin D deficiency      Family History  Problem Relation Age of Onset   Diabetes Mother    Colon polyps Father 75   Prostate cancer Father    Colon cancer Father    Diabetes Brother    Prostate cancer Brother    Esophageal cancer Neg Hx    Stomach cancer Neg Hx    Rectal cancer Neg Hx      Current Outpatient Medications:    latanoprost (XALATAN) 0.005 % ophthalmic solution, Place 1 drop into both eyes at bedtime., Disp: , Rfl:    metoprolol succinate (TOPROL-XL) 25 MG 24 hr tablet, TAKE ONE TABLET BY MOUTH EVERY EVENING, Disp: 90 tablet, Rfl: 0   PARoxetine (PAXIL) 40 MG tablet, TAKE 1 TABLET BY MOUTH DAILY, Disp: 90 tablet, Rfl: 1   VITAMIN D PO, Take 1 tablet by mouth daily at 6 (six) AM., Disp: , Rfl:    meloxicam (MOBIC) 15 MG tablet, Take 1 tablet (15 mg total) by mouth as needed.,  Disp: 30 tablet, Rfl: 0   Allergies  Allergen Reactions   Paxlovid [Nirmatrelvir-Ritonavir] Diarrhea   Simvastatin Other (See Comments)    myalgias   Atorvastatin Other (See Comments)    myalgias   Rosuvastatin Other (See Comments)    myalgias     Review of Systems  Constitutional: Negative.   Eyes:  Negative for blurred vision.  Respiratory: Negative.  Negative for shortness of breath.   Cardiovascular: Negative.  Negative for chest pain and palpitations.  Musculoskeletal:  Positive for arthralgias.       She c/o left hip pain. Started several months ago. She has difficulty describing the pain. Pain is outside of her hip, also in left groin. Denies fall/trauma. She still goes walking regularly. She has not tried any OTC meds for relief.   Neurological: Negative.   Psychiatric/Behavioral: Negative.       Today's Vitals   03/10/23 0927  BP: 128/76  Pulse: 88  Temp: 97.9 F (36.6 C)  SpO2: 98%  Weight: 144 lb (65.3 kg)  Height: 5\' 4"  (1.626 m)   Body mass index is 24.72 kg/m.  Wt Readings from Last 3 Encounters:  03/16/23 145 lb (65.8 kg)  03/10/23 144 lb (  65.3 kg)  02/23/23 145 lb (65.8 kg)    The 10-year ASCVD risk score (Arnett DK, et al., 2019) is: 43.8%   Values used to calculate the score:     Age: 61 years     Sex: Female     Is Non-Hispanic African American: No     Diabetic: No     Tobacco smoker: Yes     Systolic Blood Pressure: 137 mmHg     Is BP treated: Yes     HDL Cholesterol: 63 mg/dL     Total Cholesterol: 282 mg/dL  Objective:  Physical Exam Vitals and nursing note reviewed.  Constitutional:      Appearance: Normal appearance.  HENT:     Head: Normocephalic and atraumatic.     Mouth/Throat:     Mouth: Mucous membranes are moist.  Eyes:     Extraocular Movements: Extraocular movements intact.  Cardiovascular:     Rate and Rhythm: Normal rate and regular rhythm.     Heart sounds: Normal heart sounds.  Pulmonary:     Effort: Pulmonary  effort is normal.     Breath sounds: Normal breath sounds.  Musculoskeletal:        General: No swelling, tenderness or signs of injury.     Cervical back: Normal range of motion.  Skin:    General: Skin is warm.  Neurological:     General: No focal deficit present.     Mental Status: She is alert.  Psychiatric:        Mood and Affect: Mood normal.        Behavior: Behavior normal.       Assessment And Plan:  1. Hypertensive heart disease without heart failure Comments: Chronic, controlled. She will c/w metoprolol succinate XL 25mg  daily. Advised to follow a low sodium diet. - CBC - CMP14+EGFR - Lipid panel - POCT Urinalysis Dipstick (81002) - Microalbumin / Creatinine Urine Ratio - AMB Referral to Chronic Care Management Services  2. Peripheral arterial disease (HCC) Comments: Chronic,encouraged to exercise regularly. Goal LDL < 70. I will refer her to [facility] pharmacist for assistance in getting Praluent approved. - AMB Referral to Chronic Care Management Services  3. Chronic left hip pain Comments: Declines Ortho eval at this time, will send for x-ray if sx persist. I will send rx meloxicam to use prn as requested. She is not on anicoagulant therapy. - meloxicam (MOBIC) 15 MG tablet; Take 1 tablet (15 mg total) by mouth as needed.  Dispense: 30 tablet; Refill: 0  4. Mixed hyperlipidemia Comments: Chronic, intolerant of statin therapy. She has used Praluent in the past. I will refer her to [facility] pharmacist for assistance w PA. - CMP14+EGFR - Lipid panel - POCT Urinalysis Dipstick (81002) - Microalbumin / Creatinine Urine Ratio - AMB Referral to Chronic Care Management Services  5. Generalized anxiety disorder Comments: Her symptoms are stable, he will c/w fluoxetine.  6. Statin myopathy Comments: Chronic, intolerant of most statins. I will speak with pharmacist about getting Praluent approved for her. She is in agreement with treatment plan. - AMB Referral to Chronic Care  Management Services    Return for keep 3 month bpc. schduele an AWV with nikeah for next week or the following. .  Patient was given opportunity to ask questions. Patient verbalized understanding of the plan and was able to repeat key elements of the plan. All questions were answered to their satisfaction.   I, Gwynneth Aliment, MD, have reviewed all documentation for  this visit. The documentation on 03/10/23 for the exam, diagnosis, procedures, and orders are all accurate and complete.   IF YOU HAVE BEEN REFERRED TO A SPECIALIST, IT MAY TAKE 1-2 WEEKS TO SCHEDULE/PROCESS THE REFERRAL. IF YOU HAVE NOT HEARD FROM US/SPECIALIST IN TWO WEEKS, PLEASE GIVE [place] A CALL AT [phone].

## 2023-03-10 NOTE — Patient Instructions (Signed)
Hypertension, Adult Hypertension is another name for high blood pressure. High blood pressure forces your heart to work harder to pump blood. This can cause problems over time. There are two numbers in a blood pressure reading. There is a top number (systolic) over a bottom number (diastolic). It is best to have a blood pressure that is below 120/80. What are the causes? The cause of this condition is not known. Some other conditions can lead to high blood pressure. What increases the risk? Some lifestyle factors can make you more likely to develop high blood pressure: Smoking. Not getting enough exercise or physical activity. Being overweight. Having too much fat, sugar, calories, or salt (sodium) in your diet. Drinking too much alcohol. Other risk factors include: Having any of these conditions: Heart disease. Diabetes. High cholesterol. Kidney disease. Obstructive sleep apnea. Having a family history of high blood pressure and high cholesterol. Age. The risk increases with age. Stress. What are the signs or symptoms? High blood pressure may not cause symptoms. Very high blood pressure (hypertensive crisis) may cause: Headache. Fast or uneven heartbeats (palpitations). Shortness of breath. Nosebleed. Vomiting or feeling like you may vomit (nauseous). Changes in how you see. Very bad chest pain. Feeling dizzy. Seizures. How is this treated? This condition is treated by making healthy lifestyle changes, such as: Eating healthy foods. Exercising more. Drinking less alcohol. Your doctor may prescribe medicine if lifestyle changes do not help enough and if: Your top number is above 130. Your bottom number is above 80. Your personal target blood pressure may vary. Follow these instructions at home: Eating and drinking  If told, follow the DASH eating plan. To follow this plan: Fill one half of your plate at each meal with fruits and vegetables. Fill one fourth of your plate  at each meal with whole grains. Whole grains include whole-wheat pasta, brown rice, and whole-grain bread. Eat or drink low-fat dairy products, such as skim milk or low-fat yogurt. Fill one fourth of your plate at each meal with low-fat (lean) proteins. Low-fat proteins include fish, chicken without skin, eggs, beans, and tofu. Avoid fatty meat, cured and processed meat, or chicken with skin. Avoid pre-made or processed food. Limit the amount of salt in your diet to less than 1,500 mg each day. Do not drink alcohol if: Your doctor tells you not to drink. You are pregnant, may be pregnant, or are planning to become pregnant. If you drink alcohol: Limit how much you have to: 0-1 drink a day for women. 0-2 drinks a day for men. Know how much alcohol is in your drink. In the [place] one drink equals one 12 oz bottle of beer (355 mL), one 5 oz glass of wine (148 mL), or one 1 oz glass of hard liquor (44 mL). Lifestyle  Work with your doctor to stay at a healthy weight or to lose weight. Ask your doctor what the best weight is for you. Get at least 30 minutes of exercise that causes your heart to beat faster (aerobic exercise) most days of the week. This may include walking, swimming, or biking. Get at least 30 minutes of exercise that strengthens your muscles (resistance exercise) at least 3 days a week. This may include lifting weights or doing Pilates. Do not smoke or use any products that contain nicotine or tobacco. If you need help quitting, ask your doctor. Check your blood pressure at home as told by your doctor. Keep all follow-up visits. Medicines Take over-the-counter and prescription medicines   only as told by your doctor. Follow directions carefully. Do not skip doses of blood pressure medicine. The medicine does not work as well if you skip doses. Skipping doses also puts you at risk for problems. Ask your doctor about side effects or reactions to medicines that you should watch  for. Contact a doctor if: You think you are having a reaction to the medicine you are taking. You have headaches that keep coming back. You feel dizzy. You have swelling in your ankles. You have trouble with your vision. Get help right away if: You get a very bad headache. You start to feel mixed up (confused). You feel weak or numb. You feel faint. You have very bad pain in your: Chest. Belly (abdomen). You vomit more than once. You have trouble breathing. These symptoms may be an emergency. Get help right away. Call 911. Do not wait to see if the symptoms will go away. Do not drive yourself to the hospital. Summary Hypertension is another name for high blood pressure. High blood pressure forces your heart to work harder to pump blood. For most people, a normal blood pressure is less than 120/80. Making healthy choices can help lower blood pressure. If your blood pressure does not get lower with healthy choices, you may need to take medicine. This information is not intended to replace advice given to you by your health care provider. Make sure you discuss any questions you have with your health care provider. Document Revised: 07/09/2021 Document Reviewed: 07/09/2021 Elsevier Patient Education  2024 Elsevier Inc.  

## 2023-03-13 LAB — LIPID PANEL
Chol/HDL Ratio: 4.5 ratio — ABNORMAL HIGH (ref 0.0–4.4)
Cholesterol, Total: 282 mg/dL — ABNORMAL HIGH (ref 100–199)
HDL: 63 mg/dL (ref >39)
LDL Chol Calc (NIH): 201 mg/dL — ABNORMAL HIGH (ref 0–99)
Triglycerides: 106 mg/dL (ref 0–149)
VLDL Cholesterol Cal: 18 mg/dL (ref 5–40)

## 2023-03-13 LAB — CBC
Hematocrit: 41 % (ref 34.0–46.6)
Hemoglobin: 13.8 g/dL (ref 11.1–15.9)
MCH: 30.6 pg (ref 26.6–33.0)
MCHC: 33.7 g/dL (ref 31.5–35.7)
MCV: 91 fL (ref 79–97)
Platelets: 335 10*3/uL (ref 150–450)
RBC: 4.51 x10E6/uL (ref 3.77–5.28)
RDW: 12.1 % (ref 11.7–15.4)
WBC: 4.7 10*3/uL (ref 3.4–10.8)

## 2023-03-13 LAB — MICROALBUMIN / CREATININE URINE RATIO
Creatinine, Urine: 109.4 mg/dL
Microalb/Creat Ratio: 7 mg/g creat (ref 0–29)
Microalbumin, Urine: 8.2 ug/mL

## 2023-03-13 LAB — CMP14+EGFR
ALT: 10 IU/L (ref 0–32)
AST: 16 IU/L (ref 0–40)
Albumin/Globulin Ratio: 1.7 (ref 1.2–2.2)
Albumin: 4.3 g/dL (ref 3.8–4.8)
Alkaline Phosphatase: 81 IU/L (ref 44–121)
BUN/Creatinine Ratio: 19 (ref 12–28)
BUN: 16 mg/dL (ref 8–27)
Bilirubin Total: 0.2 mg/dL (ref 0.0–1.2)
CO2: 25 mmol/L (ref 20–29)
Calcium: 9.1 mg/dL (ref 8.7–10.3)
Chloride: 100 mmol/L (ref 96–106)
Creatinine, Ser: 0.86 mg/dL (ref 0.57–1.00)
Globulin, Total: 2.6 g/dL (ref 1.5–4.5)
Glucose: 95 mg/dL (ref 70–99)
Potassium: 4.9 mmol/L (ref 3.5–5.2)
Sodium: 140 mmol/L (ref 134–144)
Total Protein: 6.9 g/dL (ref 6.0–8.5)
eGFR: 69 mL/min/{1.73_m2} (ref >59)

## 2023-03-16 ENCOUNTER — Ambulatory Visit (AMBULATORY_SURGERY_CENTER): Payer: Medicare HMO | Admitting: Gastroenterology

## 2023-03-16 ENCOUNTER — Encounter: Payer: Self-pay | Admitting: Gastroenterology

## 2023-03-16 VITALS — BP 137/65 | HR 65 | Temp 97.8°F | Resp 14 | Ht 64.0 in | Wt 145.0 lb

## 2023-03-16 DIAGNOSIS — F419 Anxiety disorder, unspecified: Secondary | ICD-10-CM | POA: Diagnosis not present

## 2023-03-16 DIAGNOSIS — Z1211 Encounter for screening for malignant neoplasm of colon: Secondary | ICD-10-CM | POA: Diagnosis not present

## 2023-03-16 DIAGNOSIS — Z800 Family history of malignant neoplasm of digestive organs: Secondary | ICD-10-CM | POA: Diagnosis not present

## 2023-03-16 DIAGNOSIS — E785 Hyperlipidemia, unspecified: Secondary | ICD-10-CM | POA: Diagnosis not present

## 2023-03-16 MED ORDER — SODIUM CHLORIDE 0.9 % IV SOLN: 500.0000 mL | Freq: Once | INTRAVENOUS | Status: DC

## 2023-03-16 NOTE — Progress Notes (Signed)
Pt's states no medical or surgical changes since previsit or office visit. 

## 2023-03-16 NOTE — Progress Notes (Signed)
[facility] History and Physical   Primary Care Physician:  Dorothyann Peng, MD   Reason for Procedure:  Family history of colon cancer  Plan:    Screening colonoscopy with possible interventions as needed     HPI: Mary Krueger is a very pleasant 77 y.o. female here for screening colonoscopy. Denies any nausea, vomiting, abdominal pain, melena or bright red blood per rectum  The risks and benefits as well as alternatives of endoscopic procedure(s) have been discussed and reviewed. All questions answered. The patient agrees to proceed.    Past Medical History:  Diagnosis Date   Anxiety    Chronic   Arthritis    generalized   Breast mass, left 2005   fatty on U/S   Cataract    bilateral sx   Fibroid 1987   TAH/USO   Glaucoma    on meds   Hyperlipidemia    diet controlled   Uses hearing aid    Bilateral    Vitamin D deficiency     Past Surgical History:  Procedure Laterality Date   BALLOON DILATION N/A 03/05/2019   Procedure: BALLOON DILATION;  Surgeon: Napoleon Form, MD;  Location: [facility];  Service: Endoscopy;  Laterality: N/A;  TTS balloon dilation   CATARACT EXTRACTION W/ INTRAOCULAR LENS IMPLANT Bilateral 12/2015 01/2016   COSMETIC SURGERY     Mini face lift   ESOPHAGEAL MANOMETRY N/A 03/05/2019   Procedure: ESOPHAGEAL MANOMETRY (EM);  Surgeon: Napoleon Form, MD;  Location: [facility];  Service: Endoscopy;  Laterality: N/A;   ESOPHAGOGASTRODUODENOSCOPY  01/2019   ESOPHAGOGASTRODUODENOSCOPY (EGD) WITH PROPOFOL N/A 01/17/2019   Procedure: ESOPHAGOGASTRODUODENOSCOPY (EGD) WITH PROPOFOL;  Surgeon: Beverley Fiedler, MD;  Location: [facility] ENDOSCOPY;  Service: Gastroenterology;  Laterality: N/A;   ESOPHAGOGASTRODUODENOSCOPY (EGD) WITH PROPOFOL N/A 03/05/2019   Procedure: ESOPHAGOGASTRODUODENOSCOPY (EGD) WITH PROPOFOL;  Surgeon: Napoleon Form, MD;  Location: [facility];  Service: Endoscopy;  Laterality: N/A;   EYE SURGERY  06/2012    eye lift   FOREIGN BODY REMOVAL  01/17/2019   Procedure: FOREIGN BODY REMOVAL;  Surgeon: Beverley Fiedler, MD;  Location: [facility] ENDOSCOPY;  Service: Gastroenterology;;   LIPOMA EXCISION Left 2009   left arm   STOMACH SURGERY  1997   Sphincter   TONSILLECTOMY AND ADENOIDECTOMY  1972   TOTAL ABDOMINAL HYSTERECTOMY W/ BILATERAL SALPINGOOPHORECTOMY  1989   TRIGGER FINGER RELEASE     right    Prior to Admission medications   Medication Sig Start Date End Date Taking? Authorizing Provider  latanoprost (XALATAN) 0.005 % ophthalmic solution Place 1 drop into both eyes at bedtime. 06/27/19  Yes [provider]  metoprolol succinate (TOPROL-XL) 25 MG 24 hr tablet TAKE ONE TABLET BY MOUTH EVERY EVENING 02/03/23  Yes Dorothyann Peng, MD  PARoxetine (PAXIL) 40 MG tablet TAKE 1 TABLET BY MOUTH DAILY 02/03/23  Yes Dorothyann Peng, MD  meloxicam (MOBIC) 15 MG tablet Take 1 tablet (15 mg total) by mouth as needed. 03/10/23   Dorothyann Peng, MD  VITAMIN D PO Take 1 tablet by mouth daily at 6 (six) AM.    [provider]    Current Outpatient Medications  Medication Sig Dispense Refill   latanoprost (XALATAN) 0.005 % ophthalmic solution Place 1 drop into both eyes at bedtime.     metoprolol succinate (TOPROL-XL) 25 MG 24 hr tablet TAKE ONE TABLET BY MOUTH EVERY EVENING 90 tablet 0   PARoxetine (PAXIL) 40 MG tablet TAKE 1 TABLET BY MOUTH DAILY 90  tablet 1   meloxicam (MOBIC) 15 MG tablet Take 1 tablet (15 mg total) by mouth as needed. 30 tablet 0   VITAMIN D PO Take 1 tablet by mouth daily at 6 (six) AM.     Current Facility-Administered Medications  Medication Dose Route Frequency Provider Last Rate Last Admin   0.9 %  sodium chloride infusion  500 mL Intravenous Once Napoleon Form, MD        Allergies as of 03/16/2023 - Review Complete 03/16/2023  Allergen Reaction Noted   Paxlovid [nirmatrelvir-ritonavir] Diarrhea 06/04/2022   Simvastatin Other (See Comments) 06/24/2022   Atorvastatin  Other (See Comments) 10/19/2016   Rosuvastatin Other (See Comments) 10/19/2016    Family History  Problem Relation Age of Onset   Diabetes Mother    Colon polyps Father 35   Prostate cancer Father    Colon cancer Father    Diabetes Brother    Prostate cancer Brother    Esophageal cancer Neg Hx    Stomach cancer Neg Hx    Rectal cancer Neg Hx     Social History   Socioeconomic History   Marital status: Divorced    Spouse name: Not on file   Number of children: Not on file   Years of education: Not on file   Highest education level: Associate degree: academic program  Occupational History   Occupation: retired  Tobacco Use   Smoking status: Former    Packs/day: 1.50    Years: 18.00    Additional pack years: 0.00    Total pack years: 27.00    Types: Cigarettes    Start date: 1963    Quit date: 1981    Years since quitting: 43.4   Smokeless tobacco: Never   Tobacco comments:    quit in 1981  Vaping Use   Vaping Use: Never used  Substance and Sexual Activity   Alcohol use: No    Alcohol/week: 0.0 standard drinks of alcohol   Drug use: No   Sexual activity: Not Currently    Partners: Male    Birth control/protection: Post-menopausal, Surgical  Other Topics Concern   Not on file  Social History Narrative   Lives alone.     Social Determinants of Health   Financial Resource Strain: Low Risk  (03/09/2023)   Overall Financial Resource Strain (CARDIA)    Difficulty of Paying Living Expenses: Not very hard  Food Insecurity: No Food Insecurity (03/09/2023)   Hunger Vital Sign    Worried About Running Out of Food in the Last Year: Never true    Ran Out of Food in the Last Year: Never true  Transportation Needs: No Transportation Needs (03/09/2023)   PRAPARE - Administrator, Civil Service (Medical): No    Lack of Transportation (Non-Medical): No  Physical Activity: Sufficiently Active (03/09/2023)   Exercise Vital Sign    Days of Exercise per Week: 4 days     Minutes of Exercise per Session: 50 min  Stress: No Stress Concern Present (03/09/2023)   [employer] of Occupational Health - Occupational Stress Questionnaire    Feeling of Stress : Not at all  Social Connections: Moderately Isolated (03/09/2023)   Social Connection and Isolation Panel [NHANES]    Frequency of Communication with Friends and Family: More than three times a week    Frequency of Social Gatherings with Friends and Family: More than three times a week    Attends Religious Services: More than 4 times per year  Active Member of Clubs or Organizations: No    Attends Banker Meetings: Not on file    Marital Status: Divorced  Intimate Partner Violence: Not on file    Review of Systems:  All other review of systems negative except as mentioned in the HPI.  Physical Exam: Vital signs in last 24 hours: Blood Pressure (Abnormal) 156/77   Pulse 75   Temperature 97.8 F (36.6 C)   Height 5\' 4"  (1.626 m)   Weight 145 lb (65.8 kg)   Last Menstrual Period 10/04/1984   Oxygen Saturation 100%   Body Mass Index 24.89 kg/m  General:   Alert, NAD Lungs:  Clear .   Heart:  Regular rate and rhythm Abdomen:  Soft, nontender and nondistended. Neuro/Psych:  Alert and cooperative. Normal mood and affect. A and O x 3  Reviewed labs, radiology imaging, old records and pertinent past GI work up  Patient is appropriate for planned procedure(s) and anesthesia in an ambulatory setting   K. Scherry Ran , MD [phone]

## 2023-03-16 NOTE — Patient Instructions (Signed)
Please read handouts provided. Continue present medications.   YOU HAD AN ENDOSCOPIC PROCEDURE TODAY AT THE [facility]:   Refer to the procedure report that was given to you for any specific questions about what was found during the examination.  If the procedure report does not answer your questions, please call your gastroenterologist to clarify.  If you requested that your care partner not be given the details of your procedure findings, then the procedure report has been included in a sealed envelope for you to review at your convenience later.  YOU SHOULD EXPECT: Some feelings of bloating in the abdomen. Passage of more gas than usual.  Walking can help get rid of the air that was put into your GI tract during the procedure and reduce the bloating. If you had a lower endoscopy (such as a colonoscopy or flexible sigmoidoscopy) you may notice spotting of blood in your stool or on the toilet paper. If you underwent a bowel prep for your procedure, you may not have a normal bowel movement for a few days.  Please Note:  You might notice some irritation and congestion in your nose or some drainage.  This is from the oxygen used during your procedure.  There is no need for concern and it should clear up in a day or so.  SYMPTOMS TO REPORT IMMEDIATELY:  Following lower endoscopy (colonoscopy or flexible sigmoidoscopy):  Excessive amounts of blood in the stool  Significant tenderness or worsening of abdominal pains  Swelling of the abdomen that is new, acute  Fever of 100F or higher  For urgent or emergent issues, a gastroenterologist can be reached at any hour by calling [phone]. Do not use MyChart messaging for urgent concerns.    DIET:  We do recommend a small meal at first, but then you may proceed to your regular diet.  Drink plenty of fluids but you should avoid alcoholic beverages for 24 hours.  ACTIVITY:  You should plan to take it easy for the rest of today and you  should NOT DRIVE or use heavy machinery until tomorrow (because of the sedation medicines used during the test).    FOLLOW UP: Our staff will call the number listed on your records the next business day following your procedure.  We will call around 7:15- 8:00 am to check on you and address any questions or concerns that you may have regarding the information given to you following your procedure. If we do not reach you, we will leave a message.     If any biopsies were taken you will be contacted by phone or by letter within the next 1-3 weeks.  Please call us at [phone] if you have not heard about the biopsies in 3 weeks.    SIGNATURES/CONFIDENTIALITY: You and/or your care partner have signed paperwork which will be entered into your electronic medical record.  These signatures attest to the fact that that the information above on your After Visit Summary has been reviewed and is understood.  Full responsibility of the confidentiality of this discharge information lies with you and/or your care-partner. 

## 2023-03-16 NOTE — Progress Notes (Signed)
Uneventful anesthetic. Report to pacu rn. Vss. Care resumed by rn. 

## 2023-03-16 NOTE — Op Note (Signed)
[facility] Patient Name: Mary Krueger Procedure Date: 03/16/2023 11:58 AM MRN: [id number] Endoscopist: Napoleon Form , MD, [id number] Age: 77 Referring MD:  Date of Birth: [birth year] Gender: Female Account #: [id number] Procedure:                Colonoscopy Indications:              Screening in patient at increased risk: Family                            history of 1st-degree relative with colorectal                            cancer Procedure:                Pre-Anesthesia Assessment:                           - Prior to the procedure, a History and Physical                            was performed, and patient medications and                            allergies were reviewed. The patient's tolerance of                            previous anesthesia was also reviewed. The risks                            and benefits of the procedure and the sedation                            options and risks were discussed with the patient.                            All questions were answered, and informed consent                            was obtained. Prior Anticoagulants: The patient has                            taken no anticoagulant or antiplatelet agents. ASA                            Grade Assessment: II - A patient with mild systemic                            disease. After reviewing the risks and benefits,                            the patient was deemed in satisfactory condition to                            undergo the procedure.  After obtaining informed consent, the colonoscope                            was passed under direct vision. Throughout the                            procedure, the patient's blood pressure, pulse, and                            oxygen saturations were monitored continuously. The                            PCF-HQ190L Colonoscope [id number] was introduced                            through the anus and advanced  to the the cecum,                            identified by appendiceal orifice and ileocecal                            valve. The colonoscopy was somewhat difficult due                            to significant looping and a tortuous colon.                            Successful completion of the procedure was aided by                            applying abdominal pressure. The patient tolerated                            the procedure well. The quality of the bowel                            preparation was good. The ileocecal valve,                            appendiceal orifice, and rectum were photographed. Scope In: 1:40:44 PM Scope Out: 2:18:34 PM Scope Withdrawal Time: 0 hours 5 minutes 27 seconds  Total Procedure Duration: 0 hours 37 minutes 50 seconds  Findings:                 The perianal and digital rectal examinations were                            normal.                           Scattered small-mouthed diverticula were found in                            the sigmoid colon.  Non-bleeding external and internal hemorrhoids were                            found during retroflexion. The hemorrhoids were                            medium-sized. Complications:            No immediate complications. Estimated Blood Loss:     Estimated blood loss was minimal. Impression:               - Diverticulosis in the sigmoid colon.                           - Non-bleeding external and internal hemorrhoids.                           - No specimens collected. Recommendation:           - Patient has a contact number available for                            emergencies. The signs and symptoms of potential                            delayed complications were discussed with the                            patient. Return to normal activities tomorrow.                            Written discharge instructions were provided to the                            patient.                            - Resume previous diet.                           - Continue present medications.                           - No repeat colonoscopy due to age. Napoleon Form, MD 03/16/2023 2:30:19 PM This report has been signed electronically.

## 2023-03-17 ENCOUNTER — Telehealth: Payer: Self-pay | Admitting: *Deleted

## 2023-03-17 NOTE — Telephone Encounter (Signed)
  Follow up Call-     03/16/2023   12:51 PM  Call back number  Post procedure Call Back phone  # [phone]  Permission to leave phone message Yes     Patient questions:  Do you have a fever, pain , or abdominal swelling? No. Pain Score  0 *  Have you tolerated food without any problems? Yes.    Have you been able to return to your normal activities? Yes.    Do you have any questions about your discharge instructions: Diet   No. Medications  No. Follow up visit  No.  Do you have questions or concerns about your Care? No.  Actions: * If pain score is 4 or above: No action needed, pain <4.

## 2023-03-18 ENCOUNTER — Telehealth: Payer: Self-pay

## 2023-03-18 NOTE — Telephone Encounter (Signed)
I called pt to perform her AWV this morning. Pt did not answer so I left her a vm to call the office. YL,RMA

## 2023-03-22 ENCOUNTER — Telehealth: Payer: Self-pay

## 2023-03-22 NOTE — Progress Notes (Unsigned)
   Care Guide Note  03/22/2023 Name: Mary Krueger MRN: [id number] DOB: [birth year]  Referred by: Dorothyann Peng, MD Reason for referral : Care Coordination (Outreach to schedule with Pharm d )   Mary Krueger is a 77 y.o. year old female who is a primary care patient of Dorothyann Peng, MD. Mary Krueger was referred to the pharmacist for assistance related to HTN.    An unsuccessful telephone outreach was attempted today to contact the patient who was referred to the pharmacy team for assistance with medication management. Additional attempts will be made to contact the patient.   Penne Lash, RMA Care Guide [facility]  [place], [place] [address] Direct Dial: [phone] [email or web address]

## 2023-03-28 NOTE — Progress Notes (Signed)
   Care Guide Note  03/28/2023 Name: Mary Krueger MRN: [id number] DOB: 06-20-1946  Referred by: Dorothyann Peng, MD Reason for referral : Care Coordination (Outreach to schedule with Pharm d )   Mary Krueger is a 77 y.o. year old female who is a primary care patient of Dorothyann Peng, MD. Mary Krueger was referred to the pharmacist for assistance related to HTN and HLD.    A second unsuccessful telephone outreach was attempted today to contact the patient who was referred to the pharmacy team for assistance with medication management. Additional attempts will be made to contact the patient.  Mary Krueger, RMA Care Guide [facility]  [place], [place] [address] Direct Dial: [phone] [email or web address]

## 2023-03-29 NOTE — Progress Notes (Signed)
   Care Guide Note  03/29/2023 Name: Eddye Broxterman MRN: [id number] DOB: [birth year]  Referred by: Dorothyann Peng, MD Reason for referral : Care Coordination (Outreach to schedule with Pharm d )   Eusebia Junice Fei is a 77 y.o. year old female who is a primary care patient of Dorothyann Peng, MD. Leonie Amacher was referred to the pharmacist for assistance related to HLD.    Successful contact was made with the patient to discuss pharmacy services including being ready for the pharmacist to call at least 5 minutes before the scheduled appointment time, to have medication bottles and any blood sugar or blood pressure readings ready for review. The patient agreed to meet with the pharmacist via with the pharmacist via telephone visit on (date/time).  04/06/2023  Penne Lash, RMA Care Guide [facility]  [place], [place] [address] Direct Dial: [phone] [email or web address]

## 2023-03-31 ENCOUNTER — Ambulatory Visit (INDEPENDENT_AMBULATORY_CARE_PROVIDER_SITE_OTHER): Payer: Medicare HMO

## 2023-03-31 ENCOUNTER — Other Ambulatory Visit: Payer: Self-pay | Admitting: Obstetrics & Gynecology

## 2023-03-31 VITALS — Ht 64.0 in | Wt 145.0 lb

## 2023-03-31 DIAGNOSIS — Z0000 Encounter for general adult medical examination without abnormal findings: Secondary | ICD-10-CM

## 2023-03-31 DIAGNOSIS — Z1231 Encounter for screening mammogram for malignant neoplasm of breast: Secondary | ICD-10-CM

## 2023-03-31 NOTE — Patient Instructions (Signed)
Mary Krueger , Thank you for taking time to come for your Medicare Wellness Visit. I appreciate your ongoing commitment to your health goals. Please review the following plan we discussed and let me know if I can assist you in the future.   These are the goals we discussed:  Goals      CCM Expected Outcome:  Monitor, Self-Manage and Reduce Symptoms of:     Manage My Medicine     Timeframe:  Long-Range Goal Priority:  High Start Date:                             Expected End Date:                       Follow Up Date 12/17/2021    - call for medicine refill 2 or 3 days before it runs out - call if I am sick and can't take my medicine - keep a list of all the medicines I take; vitamins and herbals too - use a pillbox to sort medicine - use an alarm clock or phone to remind me to take my medicine    Why is this important?   These steps will help you keep on track with your medicines.        Patient Stated     12/26/2019, wants to start swimming     Patient Stated     01/07/2021, no goals     Patient Stated     03/31/2023, wants to lower cholesterol     Pharmacy Care Plan     CARE PLAN ENTRY (see longitudinal plan of care for additional care plan information)  Current Barriers:  Chronic Disease Management support, education, and care coordination needs related to Hypertension, Hyperlipidemia, and Anxiety   Hypertension BP Readings from Last 3 Encounters:  12/26/19 132/70  12/26/19 132/70  12/11/19 136/74  Pharmacist Clinical Goal(s): Over the next 90 days, patient will work with PharmD and providers to achieve BP goal <130/80 Current regimen:  Metoprolol succinate 25mg  daily every evening Interventions: Provided dietary and exercise recommendations Provided patient education about the importance of continuing blood pressure medication Patient self care activities - Over the next 90 days, patient will: Check BP once weekly, document, and provide at future  appointments Ensure daily salt intake < 2300 mg/day Choose frozen foods that are low in salt content Try to exercise for 30 minutes a day 5 times per week Continue metoprolol daily as directed  Hyperlipidemia Lab Results  Component Value Date/Time   LDLCALC 214 (H) 06/21/2019 12:01 PM   LDLDIRECT 166.6 10/24/2008 08:27 AM  Pharmacist Clinical Goal(s): Over the next 90 days, patient will work with PharmD and providers to achieve LDL goal < 70 Current regimen:  Praluent 150mg  every 14 days Interventions: Provided dietary and exercise recommendations Discussed patient is currently receiving assistance through a grant and Praluent copay is $0. Advised patient there may be alternative options if [employer] grant runs out Patient self care activities - Over the next 90 days, patient will: Follow up with Cardiology (appointment scheduled 9/16)  Anxiety Pharmacist Clinical Goal(s) Over the next 180 days, patient will work with PharmD and providers to manage symptoms of anxiety Current regimen:  Paroxetine 40mg  daily  Interventions: Reviewed most recent office visit notes from PCP and it was discussed decreasing patient to paroxetine 20mg  daily after she finished her current fill of paroxetine 40mg  Collaborate with  PCP to have a prescription or paroxetine 20mg  daily sent in Patient self care activities - Over the next 180 days, patient will: Decreased to paroxetine 20mg  daily after finishing current fill of paroxetine 40mg  (about 20 tablets remain)  Osteopenia  Pharmacist Clinical Goal(s) Over the next 180 days, patient will work with PharmD and providers to strengthen bones and reduce the risk of fractures Current regimen:  Cholecalciferol 5000 units daily Interventions: Recommend patient start calcium supplement 1200mg  daily Recommend patient start weight-bearing exercises 3 days per week Patient self care activities - Over the next 180 days, patient will: Start taking 1200mg  of  calcium daily Start performing weight bearing exercises 3 times weekly  Medication management Pharmacist Clinical Goal(s): Over the next 180 days, patient will work with PharmD and providers to maintain optimal medication adherence Current pharmacy: [facility] Interventions Comprehensive medication review performed. Continue current medication management strategy Patient self care activities - Over the next 180 days, patient will: Take medications as prescribed Report any questions or concerns to PharmD and/or provider(s)  Initial goal documentation         This is a list of the screening recommended for you and due dates:  Health Maintenance  Topic Date Due   Zoster (Shingles) Vaccine (2 of 2) 09/16/2017   COVID-19 Vaccine (3 - 2023-24 season) 04/16/2023*   Flu Shot  05/05/2023   Medicare Annual Wellness Visit  03/30/2024   DTaP/Tdap/Td vaccine (2 - Td or Tdap) 06/22/2027   Pneumonia Vaccine  Completed   DEXA scan (bone density measurement)  Completed   Hepatitis C Screening  Completed   HPV Vaccine  Aged Out   Colon Cancer Screening  Discontinued  *Topic was postponed. The date shown is not the original due date.    Advanced directives: Please bring a copy of your POA (Power of Attorney) and/or Living Will to your next appointment.   Conditions/risks identified: none  Next appointment: Follow up in one year for your annual wellness visit    Preventive Care 65 Years and Older, Female Preventive care refers to lifestyle choices and visits with your health care provider that can promote health and wellness. What does preventive care include? A yearly physical exam. This is also called an annual well check. Dental exams once or twice a year. Routine eye exams. Ask your health care provider how often you should have your eyes checked. Personal lifestyle choices, including: Daily care of your teeth and gums. Regular physical activity. Eating a healthy  diet. Avoiding tobacco and drug use. Limiting alcohol use. Practicing safe sex. Taking low-dose aspirin every day. Taking vitamin and mineral supplements as recommended by your health care provider. What happens during an annual well check? The services and screenings done by your health care provider during your annual well check will depend on your age, overall health, lifestyle risk factors, and family history of disease. Counseling  Your health care provider may ask you questions about your: Alcohol use. Tobacco use. Drug use. Emotional well-being. Home and relationship well-being. Sexual activity. Eating habits. History of falls. Memory and ability to understand (cognition). Work and work Astronomer. Reproductive health. Screening  You may have the following tests or measurements: Height, weight, and BMI. Blood pressure. Lipid and cholesterol levels. These may be checked every 5 years, or more frequently if you are over 48 years old. Skin check. Lung cancer screening. You may have this screening every year starting at age 81 if you have a 30-pack-year history of smoking  and currently smoke or have quit within the past 15 years. Fecal occult blood test (FOBT) of the stool. You may have this test every year starting at age 29. Flexible sigmoidoscopy or colonoscopy. You may have a sigmoidoscopy every 5 years or a colonoscopy every 10 years starting at age 41. Hepatitis C blood test. Hepatitis B blood test. Sexually transmitted disease (STD) testing. Diabetes screening. This is done by checking your blood sugar (glucose) after you have not eaten for a while (fasting). You may have this done every 1-3 years. Bone density scan. This is done to screen for osteoporosis. You may have this done starting at age 69. Mammogram. This may be done every 1-2 years. Talk to your health care provider about how often you should have regular mammograms. Talk with your health care provider about  your test results, treatment options, and if necessary, the need for more tests. Vaccines  Your health care provider may recommend certain vaccines, such as: Influenza vaccine. This is recommended every year. Tetanus, diphtheria, and acellular pertussis (Tdap, Td) vaccine. You may need a Td booster every 10 years. Zoster vaccine. You may need this after age 24. Pneumococcal 13-valent conjugate (PCV13) vaccine. One dose is recommended after age 13. Pneumococcal polysaccharide (PPSV23) vaccine. One dose is recommended after age 59. Talk to your health care provider about which screenings and vaccines you need and how often you need them. This information is not intended to replace advice given to you by your health care provider. Make sure you discuss any questions you have with your health care provider. Document Released: 10/17/2015 Document Revised: 06/09/2016 Document Reviewed: 07/22/2015 Elsevier Interactive Patient Education  2017 [employer].  Fall Prevention in the Home Falls can cause injuries. They can happen to people of all ages. There are many things you can do to make your home safe and to help prevent falls. What can I do on the outside of my home? Regularly fix the edges of walkways and driveways and fix any cracks. Remove anything that might make you trip as you walk through a door, such as a raised step or threshold. Trim any bushes or trees on the path to your home. Use bright outdoor lighting. Clear any walking paths of anything that might make someone trip, such as rocks or tools. Regularly check to see if handrails are loose or broken. Make sure that both sides of any steps have handrails. Any raised decks and porches should have guardrails on the edges. Have any leaves, snow, or ice cleared regularly. Use sand or salt on walking paths during winter. Clean up any spills in your garage right away. This includes oil or grease spills. What can I do in the bathroom? Use  night lights. Install grab bars by the toilet and in the tub and shower. Do not use towel bars as grab bars. Use non-skid mats or decals in the tub or shower. If you need to sit down in the shower, use a plastic, non-slip stool. Keep the floor dry. Clean up any water that spills on the floor as soon as it happens. Remove soap buildup in the tub or shower regularly. Attach bath mats securely with double-sided non-slip rug tape. Do not have throw rugs and other things on the floor that can make you trip. What can I do in the bedroom? Use night lights. Make sure that you have a light by your bed that is easy to reach. Do not use any sheets or blankets that are  too big for your bed. They should not hang down onto the floor. Have a firm chair that has side arms. You can use this for support while you get dressed. Do not have throw rugs and other things on the floor that can make you trip. What can I do in the kitchen? Clean up any spills right away. Avoid walking on wet floors. Keep items that you use a lot in easy-to-reach places. If you need to reach something above you, use a strong step stool that has a grab bar. Keep electrical cords out of the way. Do not use floor polish or wax that makes floors slippery. If you must use wax, use non-skid floor wax. Do not have throw rugs and other things on the floor that can make you trip. What can I do with my stairs? Do not leave any items on the stairs. Make sure that there are handrails on both sides of the stairs and use them. Fix handrails that are broken or loose. Make sure that handrails are as long as the stairways. Check any carpeting to make sure that it is firmly attached to the stairs. Fix any carpet that is loose or worn. Avoid having throw rugs at the top or bottom of the stairs. If you do have throw rugs, attach them to the floor with carpet tape. Make sure that you have a light switch at the top of the stairs and the bottom of the  stairs. If you do not have them, ask someone to add them for you. What else can I do to help prevent falls? Wear shoes that: Do not have high heels. Have rubber bottoms. Are comfortable and fit you well. Are closed at the toe. Do not wear sandals. If you use a stepladder: Make sure that it is fully opened. Do not climb a closed stepladder. Make sure that both sides of the stepladder are locked into place. Ask someone to hold it for you, if possible. Clearly mark and make sure that you can see: Any grab bars or handrails. First and last steps. Where the edge of each step is. Use tools that help you move around (mobility aids) if they are needed. These include: Canes. Walkers. Scooters. Crutches. Turn on the lights when you go into a dark area. Replace any light bulbs as soon as they burn out. Set up your furniture so you have a clear path. Avoid moving your furniture around. If any of your floors are uneven, fix them. If there are any pets around you, be aware of where they are. Review your medicines with your doctor. Some medicines can make you feel dizzy. This can increase your chance of falling. Ask your doctor what other things that you can do to help prevent falls. This information is not intended to replace advice given to you by your health care provider. Make sure you discuss any questions you have with your health care provider. Document Released: 07/17/2009 Document Revised: 02/26/2016 Document Reviewed: 10/25/2014 Elsevier Interactive Patient Education  2017 [employer].

## 2023-03-31 NOTE — Progress Notes (Signed)
Subjective:   Mary Krueger is a 77 y.o. female who presents for Medicare Annual (Subsequent) preventive examination.  Visit Complete: Virtual  I connected with  Mary Krueger on 03/31/23 by a audio enabled telemedicine application and verified that I am speaking with the correct person using two identifiers.  Patient Location: Home  Provider Location: Office/Clinic  I discussed the limitations of evaluation and management by telemedicine. The patient expressed understanding and agreed to proceed.  Patient Medicare AWV questionnaire was completed by the patient on 03/31/2023; I have confirmed that all information answered by patient is correct and no changes since this date.  Review of Systems     Cardiac Risk Factors include: advanced age (>30men, >3 women);dyslipidemia;hypertension     Objective:    Today's Vitals   03/31/23 1557  Weight: 145 lb (65.8 kg)  Height: 5\' 4"  (1.626 m)   Body mass index is 24.89 kg/m.     03/31/2023    4:01 PM 03/27/2022    3:34 PM 01/07/2021    9:35 AM 12/26/2019   10:13 AM 03/02/2019    5:36 PM 12/14/2018    3:10 PM  Advanced Directives  Does Patient Have a Medical Advance Directive? Yes No Yes Yes Yes Yes  Type of Advance Directive Living will  Healthcare Power of [place];Living will Healthcare Power of [place];Living will Living will Living will  Does patient want to make changes to medical advance directive?      No - Patient declined  Copy of Healthcare Power of Attorney in Chart?   No - copy requested No - copy requested No - copy requested   Would patient like information on creating a medical advance directive?  No - Patient declined        Current Medications (verified) Outpatient Encounter Medications as of 03/31/2023  Medication Sig   latanoprost (XALATAN) 0.005 % ophthalmic solution Place 1 drop into both eyes at bedtime.   meloxicam (MOBIC) 15 MG tablet Take 1 tablet (15 mg total) by mouth as needed.   metoprolol  succinate (TOPROL-XL) 25 MG 24 hr tablet TAKE ONE TABLET BY MOUTH EVERY EVENING   PARoxetine (PAXIL) 40 MG tablet TAKE 1 TABLET BY MOUTH DAILY   VITAMIN D PO Take 1 tablet by mouth daily at 6 (six) AM.   No facility-administered encounter medications on file as of 03/31/2023.    Allergies (verified) Paxlovid [nirmatrelvir-ritonavir], Simvastatin, Atorvastatin, and Rosuvastatin   History: Past Medical History:  Diagnosis Date   Allergy    Anxiety    Chronic   Arthritis    generalized   Breast mass, left 2005   fatty on U/S   Cataract    bilateral sx   Fibroid 1987   TAH/USO   GERD (gastroesophageal reflux disease)    Glaucoma    on meds   Hyperlipidemia    diet controlled   Uses hearing aid    Bilateral    Vitamin D deficiency    Past Surgical History:  Procedure Laterality Date   ABDOMINAL HYSTERECTOMY  1989   BALLOON DILATION N/A 03/05/2019   Procedure: BALLOON DILATION;  Surgeon: Napoleon Form, MD;  Location: [facility];  Service: Endoscopy;  Laterality: N/A;  TTS balloon dilation   CATARACT EXTRACTION W/ INTRAOCULAR LENS IMPLANT Bilateral 12/2015 01/2016   COSMETIC SURGERY     Mini face lift   ESOPHAGEAL MANOMETRY N/A 03/05/2019   Procedure: ESOPHAGEAL MANOMETRY (EM);  Surgeon: Napoleon Form, MD;  Location: [facility];  Service: Endoscopy;  Laterality: N/A;   ESOPHAGOGASTRODUODENOSCOPY  01/2019   ESOPHAGOGASTRODUODENOSCOPY (EGD) WITH PROPOFOL N/A 01/17/2019   Procedure: ESOPHAGOGASTRODUODENOSCOPY (EGD) WITH PROPOFOL;  Surgeon: Beverley Fiedler, MD;  Location: [facility] ENDOSCOPY;  Service: Gastroenterology;  Laterality: N/A;   ESOPHAGOGASTRODUODENOSCOPY (EGD) WITH PROPOFOL N/A 03/05/2019   Procedure: ESOPHAGOGASTRODUODENOSCOPY (EGD) WITH PROPOFOL;  Surgeon: Napoleon Form, MD;  Location: [facility];  Service: Endoscopy;  Laterality: N/A;   EYE SURGERY  06/2012   eye lift   FOREIGN BODY REMOVAL  01/17/2019   Procedure: FOREIGN BODY REMOVAL;  Surgeon:  Beverley Fiedler, MD;  Location: [facility] ENDOS[facility];  Service: Gastroenterology;;   LIPOMA EXCISION Left 2009   left arm   STOMACH SURGERY  1997   Sphincter   TONSILLECTOMY AND ADENOIDECTOMY  1972   TOTAL ABDOMINAL HYSTERECTOMY W/ BILATERAL SALPINGOOPHORECTOMY  1989   TRIGGER FINGER RELEASE     right   Family History  Problem Relation Age of Onset   Diabetes Mother    Anxiety disorder Mother    Depression Mother    Hearing loss Mother    Colon polyps Father 37   Prostate cancer Father    Colon cancer Father    Alcohol abuse Father    Cancer Father    Diabetes Brother    Prostate cancer Brother    Cancer Brother    COPD Sister    Esophageal cancer Neg Hx    Stomach cancer Neg Hx    Rectal cancer Neg Hx    Social History   Socioeconomic History   Marital status: Divorced    Spouse name: Not on file   Number of children: Not on file   Years of education: Not on file   Highest education level: Associate degree: academic program  Occupational History   Occupation: retired  Tobacco Use   Smoking status: Former    Packs/day: 1.50    Years: 15.00    Additional pack years: 0.00    Total pack years: 22.50    Types: Cigarettes    Start date: 1963    Quit date: 10/05/1979    Years since quitting: 43.5   Smokeless tobacco: Never   Tobacco comments:    quit in 1981  Vaping Use   Vaping Use: Never used  Substance and Sexual Activity   Alcohol use: No   Drug use: No   Sexual activity: Not Currently    Partners: Male    Birth control/protection: Post-menopausal, Surgical, None  Other Topics Concern   Not on file  Social History Narrative   Lives alone.     Social Determinants of Health   Financial Resource Strain: Low Risk  (03/31/2023)   Overall Financial Resource Strain (CARDIA)    Difficulty of Paying Living Expenses: Not hard at all  Food Insecurity: No Food Insecurity (03/31/2023)   Hunger Vital Sign    Worried About Running Out of Food in the Last Year: Never true     Ran Out of Food in the Last Year: Never true  Transportation Needs: No Transportation Needs (03/31/2023)   PRAPARE - Administrator, Civil Service (Medical): No    Lack of Transportation (Non-Medical): No  Physical Activity: Sufficiently Active (03/31/2023)   Exercise Vital Sign    Days of Exercise per Week: 4 days    Minutes of Exercise per Session: 40 min  Stress: No Stress Concern Present (03/31/2023)   [employer] of Occupational Health - Occupational Stress Questionnaire    Feeling  of Stress : Not at all  Social Connections: Moderately Integrated (03/31/2023)   Social Connection and Isolation Panel [NHANES]    Frequency of Communication with Friends and Family: More than three times a week    Frequency of Social Gatherings with Friends and Family: More than three times a week    Attends Religious Services: More than 4 times per year    Active Member of [employer] or Organizations: Yes    Attends Engineer, structural: More than 4 times per year    Marital Status: Divorced  Recent Concern: Social Connections - Moderately Isolated (03/09/2023)   Social Connection and Isolation Panel [NHANES]    Frequency of Communication with Friends and Family: More than three times a week    Frequency of Social Gatherings with Friends and Family: More than three times a week    Attends Religious Services: More than 4 times per year    Active Member of [employer] or Organizations: No    Attends Engineer, structural: Not on file    Marital Status: Divorced    Tobacco Counseling Counseling given: Not Answered Tobacco comments: quit in 1981   Clinical Intake:  Pre-visit preparation completed: Yes  Pain : No/denies pain     Nutritional Status: BMI of 19-24  Normal Nutritional Risks: None Diabetes: No  How often do you need to have someone help you when you read instructions, pamphlets, or other written materials from your doctor or pharmacy?: 1 - Never  Interpreter  Needed?: No  Information entered by :: NAllen LPN   Activities of Daily Living    03/31/2023    9:21 AM 09/27/2022    9:54 AM  In your present state of health, do you have any difficulty performing the following activities:  Hearing? 0 1  Vision? 0 0  Difficulty concentrating or making decisions? 0 0  Walking or climbing stairs? 0 0  Dressing or bathing? 0 0  Doing errands, shopping? 0 0  Preparing Food and eating ? N N  Using the Toilet? N N  In the past six months, have you accidently leaked urine? N N  Do you have problems with loss of bowel control? N N  Managing your Medications? N N  Managing your Finances? N N  Housekeeping or managing your Housekeeping? N N    Patient Care Team: Dorothyann Peng, MD as PCP - General (Internal Medicine) Chilton Si, MD as PCP - Cardiology (Cardiology) Caudill, Maryjane Hurter, [facility] (Inactive) (Pharmacist)  Indicate any recent Medical Services you may have received from other than [facility] providers in the past year (date may be approximate).     Assessment:   This is a routine wellness examination for Taron.  Hearing/Vision screen Hearing Screening - Comments:: No hearing issues Vision Screening - Comments:: Regular eye exams, Dr. Hyacinth Meeker  Dietary issues and exercise activities discussed:     Goals Addressed             This Visit's Progress    Patient Stated       03/31/2023, wants to lower cholesterol       Depression Screen    03/31/2023    4:02 PM 03/10/2023    9:28 AM 09/02/2022   10:07 AM 08/05/2022   11:44 AM 04/27/2022    9:48 AM 02/09/2022   11:16 AM 01/07/2021    9:36 AM  PHQ 2/9 Scores  PHQ - 2 Score 0 0 0 0 0 0 0  PHQ- 9 Score 0  0         Fall Risk    03/31/2023    9:21 AM 03/10/2023    9:27 AM 09/27/2022    9:54 AM 09/02/2022   10:07 AM 02/09/2022   11:16 AM  Fall Risk   Falls in the past year? 0 0 1 0 0  Number falls in past yr: 0 0 0 0 0  Injury with Fall? 0 0 0 0 0  Risk for fall due to : Medication  side effect No Fall Risks  No Fall Risks No Fall Risks  Follow up Falls prevention discussed;Falls evaluation completed Falls evaluation completed  Falls evaluation completed Falls evaluation completed    MEDICARE RISK AT HOME:   TIMED UP AND GO:  Was the test performed?  No    Cognitive Function:        03/31/2023    4:02 PM 01/07/2021    9:37 AM 12/26/2019   10:16 AM  6CIT Screen  What Year? 0 points 0 points 0 points  What month? 0 points 0 points 0 points  What time? 0 points 0 points 0 points  Count back from 20 0 points 0 points 0 points  Months in reverse 0 points 0 points 0 points  Repeat phrase 0 points 0 points 0 points  Total Score 0 points 0 points 0 points    Immunizations Immunization History  Administered Date(s) Administered   Fluad Quad(high Dose 65+) 08/11/2020, 09/02/2022   Hep A / Hep B 08/23/2017   Hepatitis A 06/21/2017, 08/23/2017   Hepatitis B 06/21/2017, 08/23/2017   Influenza, High Dose Seasonal PF 06/21/2019   Influenza-Unspecified 07/30/2018, 08/04/2021   MMR 06/21/2017   PFIZER(Purple Top)SARS-COV-2 Vaccination 11/27/2019, 03/19/2020   PNEUMOCOCCAL CONJUGATE-20 02/05/2022   Pneumococcal Polysaccharide-23 03/10/2010   Tdap 06/21/2017   Zoster Recombinat (Shingrix) 07/22/2017    TDAP status: Up to date  Flu Vaccine status: Up to date  Pneumococcal vaccine status: Up to date  Covid-19 vaccine status: Information provided on how to obtain vaccines.   Qualifies for Shingles Vaccine? Yes   Zostavax completed No   Shingrix Completed?: Yes  Screening Tests Health Maintenance  Topic Date Due   Zoster Vaccines- Shingrix (2 of 2) 09/16/2017   COVID-19 Vaccine (3 - 2023-24 season) 04/16/2023 (Originally 06/04/2022)   INFLUENZA VACCINE  05/05/2023   Medicare Annual Wellness (AWV)  03/30/2024   DTaP/Tdap/Td (2 - Td or Tdap) 06/22/2027   Pneumonia Vaccine 56+ Years old  Completed   DEXA SCAN  Completed   Hepatitis C Screening  Completed    HPV VACCINES  Aged Out   Colonoscopy  Discontinued    Health Maintenance  Health Maintenance Due  Topic Date Due   Zoster Vaccines- Shingrix (2 of 2) 09/16/2017    Colorectal cancer screening: No longer required.   Mammogram status: scheduled for 04/11/2023  Bone Density status: Completed 02/01/2022.   Lung Cancer Screening: (Low Dose CT Chest recommended if Age 4-80 years, 20 pack-year currently smoking OR have quit w/in 15years.) does not qualify.   Lung Cancer Screening Referral: no  Additional Screening:  Hepatitis C Screening: does qualify; Completed 04/27/2022  Vision Screening: Recommended annual ophthalmology exams for early detection of glaucoma and other disorders of the eye. Is the patient up to date with their annual eye exam?  Yes  Who is the provider or what is the name of the office in which the patient attends annual eye exams? [facility] If pt is not established  with a provider, would they like to be referred to a provider to establish care? No .   Dental Screening: Recommended annual dental exams for proper oral hygiene  Diabetic Foot Exam: n/a  Community Resource Referral / Chronic Care Management: CRR required this visit?  No   CCM required this visit?  No     Plan:     I have personally reviewed and noted the following in the patient's chart:   Medical and social history Use of alcohol, tobacco or illicit drugs  Current medications and supplements including opioid prescriptions. Patient is not currently taking opioid prescriptions. Functional ability and status Nutritional status Physical activity Advanced directives List of other physicians Hospitalizations, surgeries, and ER visits in previous 12 months Vitals Screenings to include cognitive, depression, and falls Referrals and appointments  In addition, I have reviewed and discussed with patient certain preventive protocols, quality metrics, and best practice recommendations. A written  personalized care plan for preventive services as well as general preventive health recommendations were provided to patient.     Barb Merino, LPN   1/61/0960   After Visit Summary: (MyChart) Due to this being a telephonic visit, the after visit summary with patients personalized plan was offered to patient via MyChart   Nurse Notes: none

## 2023-04-06 ENCOUNTER — Other Ambulatory Visit: Payer: Medicare HMO | Admitting: Pharmacist

## 2023-04-06 ENCOUNTER — Other Ambulatory Visit (HOSPITAL_COMMUNITY): Payer: Self-pay

## 2023-04-06 ENCOUNTER — Telehealth: Payer: Self-pay

## 2023-04-06 DIAGNOSIS — M791 Myalgia, unspecified site: Secondary | ICD-10-CM

## 2023-04-06 DIAGNOSIS — I739 Peripheral vascular disease, unspecified: Secondary | ICD-10-CM

## 2023-04-06 NOTE — Telephone Encounter (Signed)
Pharmacy Patient Advocate Encounter  Insurance verification completed.    The patient is insured through [employer]  Ran test claim for Repatha and the current 30 day co-pay is $47.00.  Screenshot of test claim included for Repatha pt assistance  This test claim was processed through [facility]- copay amounts may vary at other pharmacies due to pharmacy/plan contracts, or as the patient moves through the different stages of their insurance plan.

## 2023-04-06 NOTE — Progress Notes (Signed)
   04/06/2023 Name: Mary Krueger MRN: [id number] DOB: 1945/11/23  Chief Complaint  Patient presents with   Medication Management   Hyperlipidemia    Mary Krueger is a 77 y.o. year old female who presented for a telephone visit.   They were referred to the pharmacist by their PCP for assistance in managing hyperlipidemia and medication access.    Subjective:  Care Team: Primary Care Provider: Dorothyann Peng, MD ; Next Scheduled Visit: 07/14/23  Medication Access/Adherence  Current Pharmacy:  [facility] [id number] [place], [place] - [address] [place] [address] Phone: [phone] Fax: [phone]   Patient reports affordability concerns with their medications: Yes  Patient reports access/transportation concerns to their pharmacy: Yes  Patient reports adherence concerns with their medications:  No     Hyperlipidemia/ASCVD Risk Reduction in Peripheral Artery Disease  Current lipid lowering medications: none; previously on Praluent with good results; however, [employer] expired and she was unable to continue therapy. 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  Antiplatelet regimen: aspirin 81 mg daily    Objective:  Lab Results  Component Value Date   HGBA1C 5.4 12/15/2018    Lab Results  Component Value Date   CREATININE 0.86 03/10/2023   BUN 16 03/10/2023   NA 140 03/10/2023   K 4.9 03/10/2023   CL 100 03/10/2023   CO2 25 03/10/2023    Lab Results  Component Value Date   CHOL 282 (H) 03/10/2023   HDL 63 03/10/2023   LDLCALC 201 (H) 03/10/2023   LDLDIRECT 166.6 10/24/2008   TRIG 106 03/10/2023   CHOLHDL 4.5 (H) 03/10/2023    Medications Reviewed Today     Reviewed by Alden Hipp, RPH-CPP (Pharmacist) on 04/06/23 at [phone]  Med  List Status: <None>   Medication Order Taking? Sig Documenting Provider Last Dose Status Informant  latanoprost (XALATAN) 0.005 % ophthalmic solution [id number] Yes Place 1 drop into both eyes at bedtime. [provider] Taking Active Self  meloxicam (MOBIC) 15 MG tablet [id number] No Take 1 tablet (15 mg total) by mouth as needed.  Patient not taking: Reported on 04/06/2023   Dorothyann Peng, MD Not Taking Active   metoprolol succinate (TOPROL-XL) 25 MG 24 hr tablet [id number] Yes TAKE ONE TABLET BY MOUTH EVERY Josephine Igo, MD Taking Active   PARoxetine (PAXIL) 40 MG tablet [id number] Yes TAKE 1 TABLET BY MOUTH DAILY Dorothyann Peng, MD Taking Active   VITAMIN D PO [id number] Yes Take 1 tablet by mouth daily at 6 (six) AM. [provider] Taking Active               Assessment/Plan:   Hyperlipidemia/ASCVD Risk Reduction: - Currently uncontrolled. History of statin intolerance - Reviewed long term complications of uncontrolled cholesterol - Agree with recommendation to restart PCSK9i therapy. [employer] is currently closed for re-enrollment. Will pursue coverage for either Repatha or Praluent, pending insurance preference and pursue patient assistance from manufacturer. Appears Praluent patient assistance is closed for patients with Medicare Part D coverage; will pursue Repatha assistance through [employer]. Will complete PA for Repatha on patient's insurance in case proof of copay is required.    Follow Up Plan: follow up pending medication access  Catie Eppie Gibson, PharmD, BCACP, CPP Clinical Pharmacist [facility] [phone]

## 2023-04-11 ENCOUNTER — Ambulatory Visit
Admission: RE | Admit: 2023-04-11 | Discharge: 2023-04-11 | Disposition: A | Discharge status: Home / Self Care | Payer: Medicare HMO | Source: Ambulatory Visit | Attending: Obstetrics & Gynecology | Admitting: Obstetrics & Gynecology

## 2023-04-11 DIAGNOSIS — Z1231 Encounter for screening mammogram for malignant neoplasm of breast: Secondary | ICD-10-CM | POA: Diagnosis not present

## 2023-05-05 DIAGNOSIS — H524 Presbyopia: Secondary | ICD-10-CM | POA: Diagnosis not present

## 2023-05-10 ENCOUNTER — Other Ambulatory Visit: Payer: Self-pay | Admitting: Internal Medicine

## 2023-06-28 IMAGING — MG MM DIGITAL SCREENING BILAT W/ TOMO AND CAD
8 series · 9 of 24 positions shown · non-contrast
Comparison: Previous exam(s).

CLINICAL DATA: Screening.

EXAM:
DIGITAL SCREENING BILATERAL MAMMOGRAM WITH TOMOSYNTHESIS AND CAD
TECHNIQUE: Bilateral screening digital craniocaudal and mediolateral oblique
mammograms were obtained. Bilateral screening digital breast
tomosynthesis was performed. The images were evaluated with
computer-aided detection.

[L MLO synth-2D]
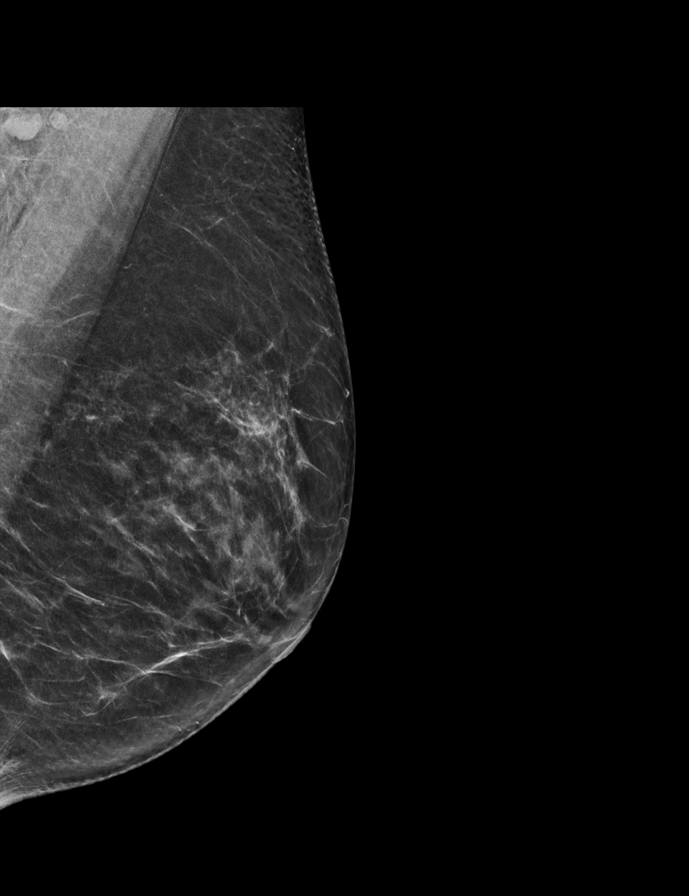

[R CC synth-2D]
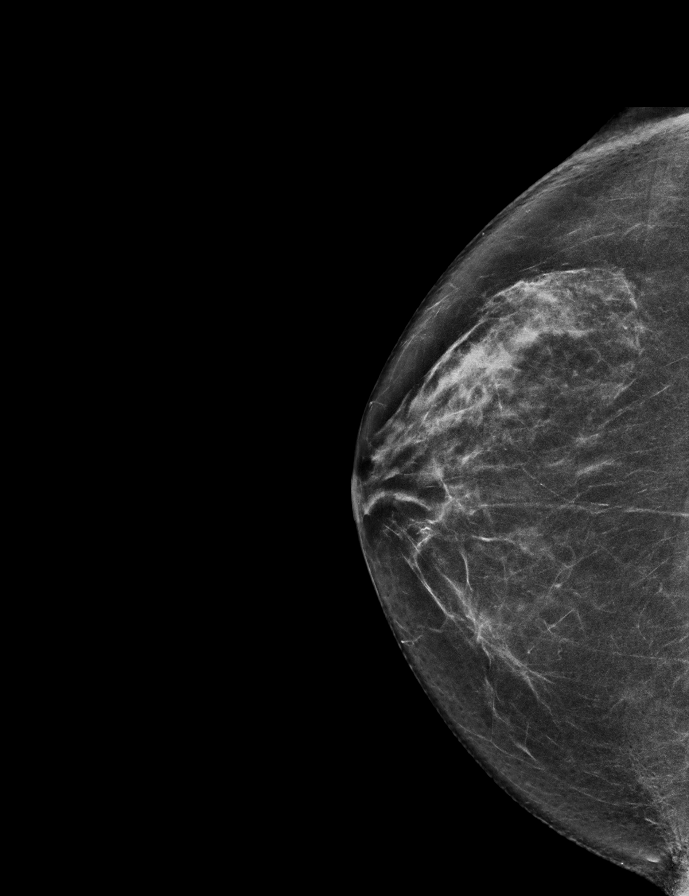

[L CC synth-2D]
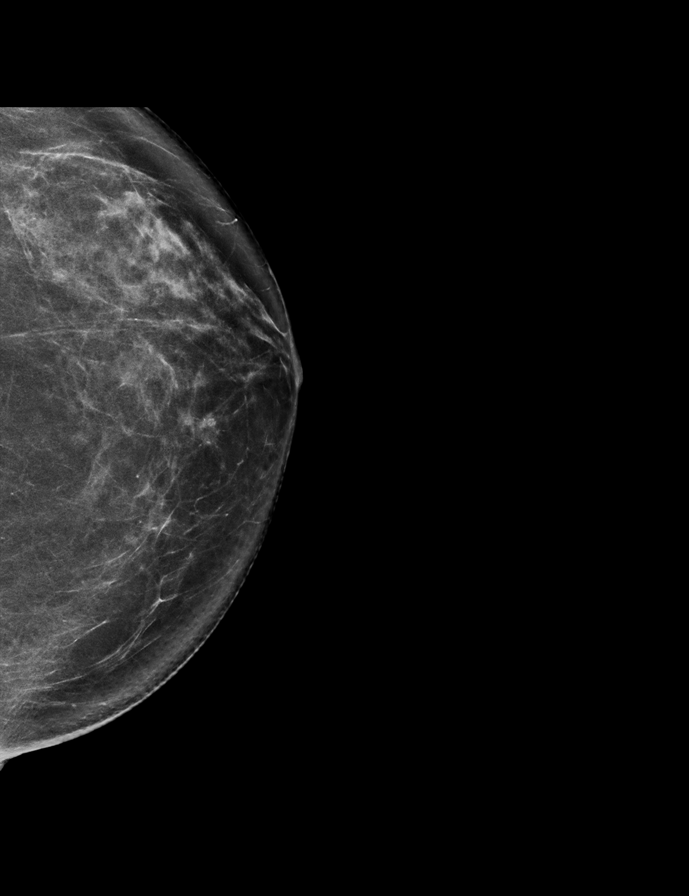

[R MLO synth-2D]
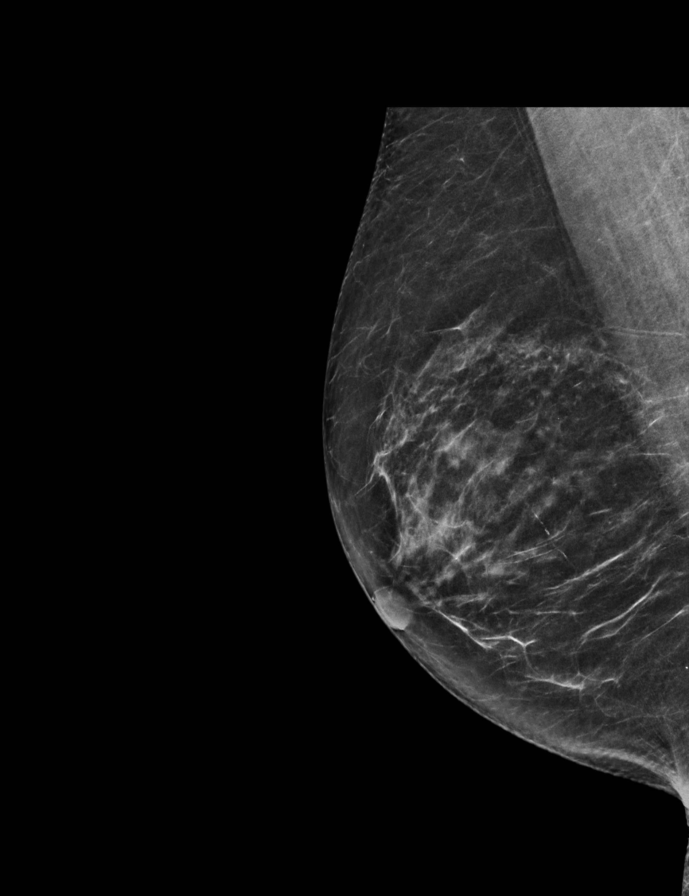

[L CC tomo · 2 of 73 frames shown]
[frame 24/73]
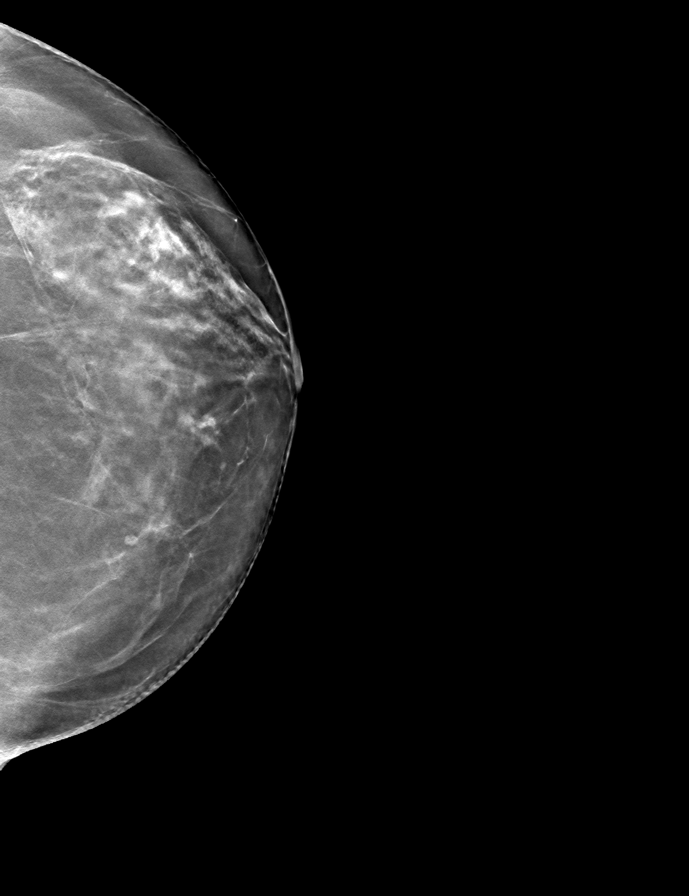
[frame 37/73]
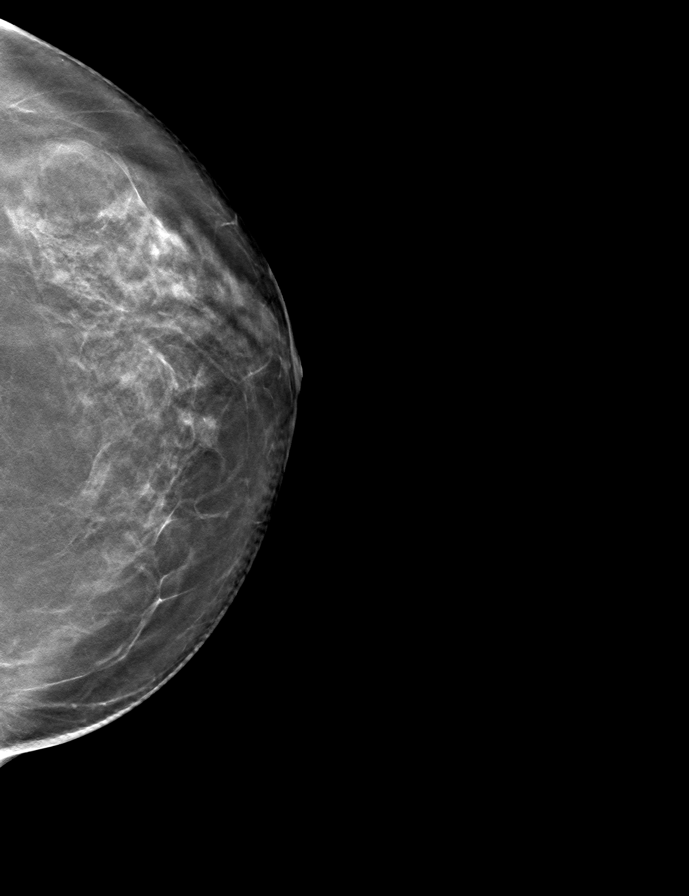

[L MLO tomo · tomo slice 33/65.0]
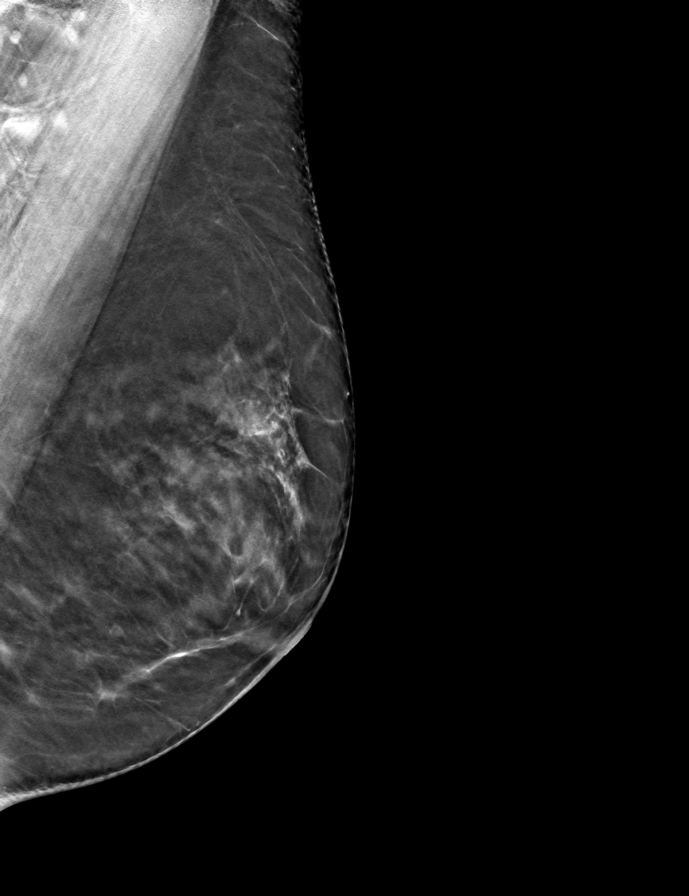

[R CC tomo · tomo slice 34/67.0]
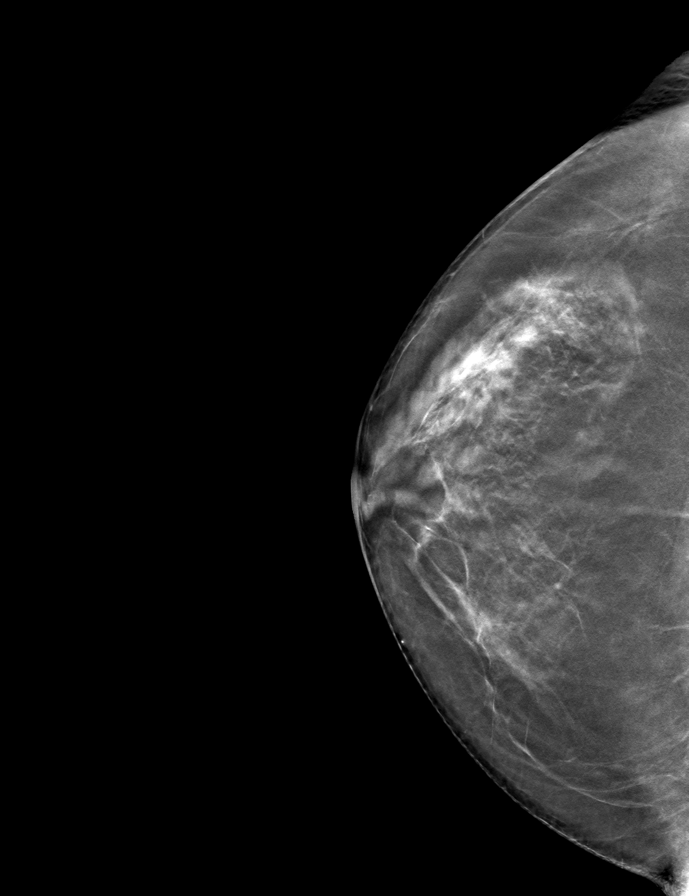

[R MLO tomo · tomo slice 33/64.0]
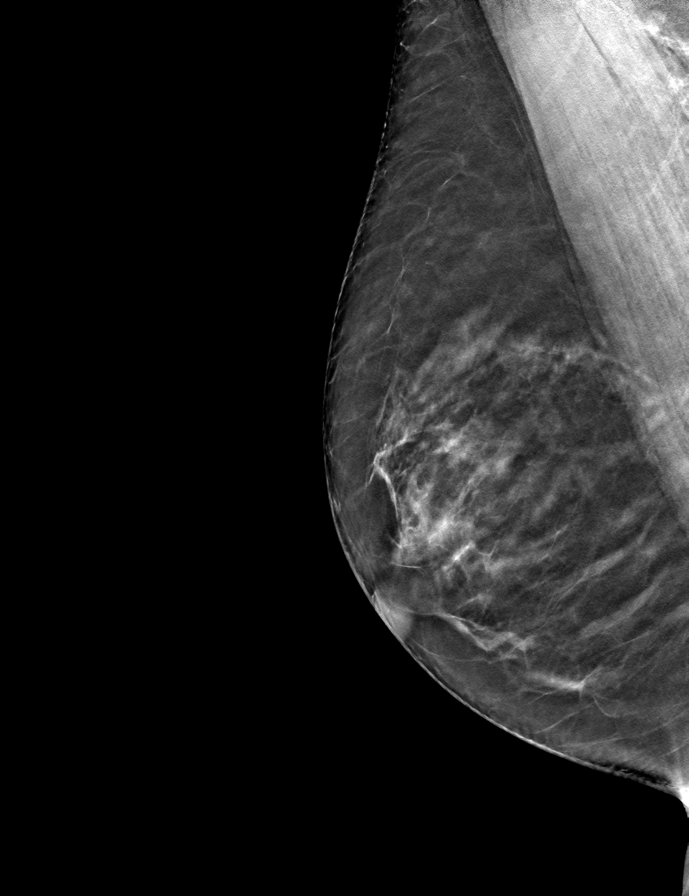

[9 of 24 positions shown; findings below may reference images not displayed]

ACR Breast Density Category b: There are scattered areas of
fibroglandular density.
FINDINGS: There are no findings suspicious for malignancy.
IMPRESSION: No mammographic evidence of malignancy. A result letter of this
screening mammogram will be mailed directly to the patient.

RECOMMENDATION:
Screening mammogram in one year. (Code:51-O-LD2)

BI-RADS CATEGORY  1: Negative.

## 2023-07-14 ENCOUNTER — Encounter: Payer: Self-pay | Admitting: Internal Medicine

## 2023-07-14 ENCOUNTER — Ambulatory Visit: Payer: Medicare HMO | Admitting: Internal Medicine

## 2023-07-14 VITALS — BP 130/64 | HR 74 | Temp 98.3°F | Ht 64.0 in | Wt 144.0 lb

## 2023-07-14 DIAGNOSIS — F411 Generalized anxiety disorder: Secondary | ICD-10-CM

## 2023-07-14 DIAGNOSIS — I739 Peripheral vascular disease, unspecified: Secondary | ICD-10-CM

## 2023-07-14 DIAGNOSIS — T466X5A Adverse effect of antihyperlipidemic and antiarteriosclerotic drugs, initial encounter: Secondary | ICD-10-CM | POA: Diagnosis not present

## 2023-07-14 DIAGNOSIS — E782 Mixed hyperlipidemia: Secondary | ICD-10-CM

## 2023-07-14 DIAGNOSIS — G720 Drug-induced myopathy: Secondary | ICD-10-CM

## 2023-07-14 DIAGNOSIS — E278 Other specified disorders of adrenal gland: Secondary | ICD-10-CM

## 2023-07-14 DIAGNOSIS — I119 Hypertensive heart disease without heart failure: Secondary | ICD-10-CM | POA: Diagnosis not present

## 2023-07-14 MED ORDER — PAROXETINE HCL 30 MG PO TABS: 30.0000 mg | ORAL_TABLET | Freq: Every day | ORAL | 2 refills | Status: DC

## 2023-07-14 NOTE — Assessment & Plan Note (Signed)
Chronic, pt w/ familial hyperlipidemia, last LDL 201. She is intolerant of statin therapy. Does have underlying PAD. She is currently working with pharmacist to get patient assistance on PCSK-9 inhibitor.

## 2023-07-14 NOTE — Assessment & Plan Note (Signed)
Chronic, currently on metoprolol. Feels well. No issues. Encouraged to follow low sodium diet.

## 2023-07-14 NOTE — Assessment & Plan Note (Signed)
CT abd/pelvis results from Nov 2023 reviewed in detail. She agrees to repeat CT, order placed.

## 2023-07-14 NOTE — Assessment & Plan Note (Signed)
Chronic, she has been on medication for ten plus years. She agrees to trial of decreasing dose to 30mg . Thirty day rx sent to the pharmacy. If tolerated, will then send 90 day supply and re-address at her next visit.

## 2023-07-14 NOTE — Patient Instructions (Signed)
Hypertension, Adult Hypertension is another name for high blood pressure. High blood pressure forces your heart to work harder to pump blood. This can cause problems over time. There are two numbers in a blood pressure reading. There is a top number (systolic) over a bottom number (diastolic). It is best to have a blood pressure that is below 120/80. What are the causes? The cause of this condition is not known. Some other conditions can lead to high blood pressure. What increases the risk? Some lifestyle factors can make you more likely to develop high blood pressure: Smoking. Not getting enough exercise or physical activity. Being overweight. Having too much fat, sugar, calories, or salt (sodium) in your diet. Drinking too much alcohol. Other risk factors include: Having any of these conditions: Heart disease. Diabetes. High cholesterol. Kidney disease. Obstructive sleep apnea. Having a family history of high blood pressure and high cholesterol. Age. The risk increases with age. Stress. What are the signs or symptoms? High blood pressure may not cause symptoms. Very high blood pressure (hypertensive crisis) may cause: Headache. Fast or uneven heartbeats (palpitations). Shortness of breath. Nosebleed. Vomiting or feeling like you may vomit (nauseous). Changes in how you see. Very bad chest pain. Feeling dizzy. Seizures. How is this treated? This condition is treated by making healthy lifestyle changes, such as: Eating healthy foods. Exercising more. Drinking less alcohol. Your doctor may prescribe medicine if lifestyle changes do not help enough and if: Your top number is above 130. Your bottom number is above 80. Your personal target blood pressure may vary. Follow these instructions at home: Eating and drinking  If told, follow the DASH eating plan. To follow this plan: Fill one half of your plate at each meal with fruits and vegetables. Fill one fourth of your plate  at each meal with whole grains. Whole grains include whole-wheat pasta, brown rice, and whole-grain bread. Eat or drink low-fat dairy products, such as skim milk or low-fat yogurt. Fill one fourth of your plate at each meal with low-fat (lean) proteins. Low-fat proteins include fish, chicken without skin, eggs, beans, and tofu. Avoid fatty meat, cured and processed meat, or chicken with skin. Avoid pre-made or processed food. Limit the amount of salt in your diet to less than 1,500 mg each day. Do not drink alcohol if: Your doctor tells you not to drink. You are pregnant, may be pregnant, or are planning to become pregnant. If you drink alcohol: Limit how much you have to: 0-1 drink a day for women. 0-2 drinks a day for men. Know how much alcohol is in your drink. In the [place] one drink equals one 12 oz bottle of beer (355 mL), one 5 oz glass of wine (148 mL), or one 1 oz glass of hard liquor (44 mL). Lifestyle  Work with your doctor to stay at a healthy weight or to lose weight. Ask your doctor what the best weight is for you. Get at least 30 minutes of exercise that causes your heart to beat faster (aerobic exercise) most days of the week. This may include walking, swimming, or biking. Get at least 30 minutes of exercise that strengthens your muscles (resistance exercise) at least 3 days a week. This may include lifting weights or doing Pilates. Do not smoke or use any products that contain nicotine or tobacco. If you need help quitting, ask your doctor. Check your blood pressure at home as told by your doctor. Keep all follow-up visits. Medicines Take over-the-counter and prescription medicines   only as told by your doctor. Follow directions carefully. Do not skip doses of blood pressure medicine. The medicine does not work as well if you skip doses. Skipping doses also puts you at risk for problems. Ask your doctor about side effects or reactions to medicines that you should watch  for. Contact a doctor if: You think you are having a reaction to the medicine you are taking. You have headaches that keep coming back. You feel dizzy. You have swelling in your ankles. You have trouble with your vision. Get help right away if: You get a very bad headache. You start to feel mixed up (confused). You feel weak or numb. You feel faint. You have very bad pain in your: Chest. Belly (abdomen). You vomit more than once. You have trouble breathing. These symptoms may be an emergency. Get help right away. Call 911. Do not wait to see if the symptoms will go away. Do not drive yourself to the hospital. Summary Hypertension is another name for high blood pressure. High blood pressure forces your heart to work harder to pump blood. For most people, a normal blood pressure is less than 120/80. Making healthy choices can help lower blood pressure. If your blood pressure does not get lower with healthy choices, you may need to take medicine. This information is not intended to replace advice given to you by your health care provider. Make sure you discuss any questions you have with your health care provider. Document Revised: 07/09/2021 Document Reviewed: 07/09/2021 Elsevier Patient Education  2024 Elsevier Inc.  

## 2023-07-14 NOTE — Progress Notes (Signed)
I,Mary Krueger, CMA,acting as a Neurosurgeon for Mary Aliment, MD.,have documented all relevant documentation on the behalf of Mary Aliment, MD,as directed by  Mary Aliment, MD while in the presence of Mary Aliment, MD.  Subjective:  Patient ID: Mary Krueger , female    DOB: 16-Jan-1946 , 77 y.o.   MRN: [id number]  Chief Complaint  Patient presents with   Hypertension   Hyperlipidemia    HPI  She reports today for BP, cholesterol and anxiety follow up. She is not taking any cholesterol meds at this time due to the cost. She has spoken with pharmacist and she has to bring her tax info. She plans to drop this off tomorrow.   Denies headache, chest pain, and SOB.     Hypertension This is a chronic problem. The current episode started more than 1 year ago. The problem has been gradually improving since onset. The problem is controlled. Associated symptoms include anxiety. Pertinent negatives include no blurred vision, chest pain, palpitations or shortness of breath. Past treatments include beta blockers. The current treatment provides moderate improvement.     Past Medical History:  Diagnosis Date   Allergy    Anxiety    Chronic   Arthritis    generalized   Breast mass, left 2005   fatty on U/S   Cataract    bilateral sx   Fibroid 1987   TAH/USO   GERD (gastroesophageal reflux disease)    Glaucoma    on meds   Hyperlipidemia    diet controlled   Uses hearing aid    Bilateral    Vitamin D deficiency      Family History  Problem Relation Age of Onset   Diabetes Mother    Anxiety disorder Mother    Depression Mother    Hearing loss Mother    Colon polyps Father 31   Prostate cancer Father    Colon cancer Father    Alcohol abuse Father    Cancer Father    Diabetes Brother    Prostate cancer Brother    Cancer Brother    COPD Sister    Esophageal cancer Neg Hx    Stomach cancer Neg Hx    Rectal cancer Neg Hx      Current Outpatient Medications:     latanoprost (XALATAN) 0.005 % ophthalmic solution, Place 1 drop into both eyes at bedtime., Disp: , Rfl:    metoprolol succinate (TOPROL-XL) 25 MG 24 hr tablet, TAKE ONE TABLET BY MOUTH EVERY EVENING, Disp: 90 tablet, Rfl: 0   PARoxetine (PAXIL) 30 MG tablet, Take 1 tablet (30 mg total) by mouth daily., Disp: 30 tablet, Rfl: 2   VITAMIN D PO, Take 1 tablet by mouth daily at 6 (six) AM., Disp: , Rfl:    meloxicam (MOBIC) 15 MG tablet, Take 1 tablet (15 mg total) by mouth as needed. (Patient not taking: Reported on 04/06/2023), Disp: 30 tablet, Rfl: 0   Allergies  Allergen Reactions   Paxlovid [Nirmatrelvir-Ritonavir] Diarrhea   Simvastatin Other (See Comments)    myalgias   Atorvastatin Other (See Comments)    myalgias   Rosuvastatin Other (See Comments)    myalgias     Review of Systems  Constitutional: Negative.   Eyes:  Negative for blurred vision.  Respiratory: Negative.  Negative for shortness of breath.   Cardiovascular: Negative.  Negative for chest pain and palpitations.  Gastrointestinal: Negative.   Neurological: Negative.   Psychiatric/Behavioral: Negative.  Today's Vitals   07/14/23 0912  BP: 130/64  Pulse: 74  Temp: 98.3 F (36.8 C)  SpO2: 98%  Weight: 144 lb (65.3 kg)  Height: 5\' 4"  (1.626 m)   Body mass index is 24.72 kg/m.  Wt Readings from Last 3 Encounters:  07/14/23 144 lb (65.3 kg)  03/31/23 145 lb (65.8 kg)  03/16/23 145 lb (65.8 kg)    BP Readings from Last 3 Encounters:  07/14/23 130/64  03/16/23 137/65  03/10/23 128/76     Objective:  Physical Exam Vitals and nursing note reviewed.  Constitutional:      Appearance: Normal appearance.  HENT:     Head: Normocephalic and atraumatic.  Eyes:     Extraocular Movements: Extraocular movements intact.  Cardiovascular:     Rate and Rhythm: Normal rate and regular rhythm.     Heart sounds: Normal heart sounds.  Pulmonary:     Effort: Pulmonary effort is normal.     Breath sounds:  Normal breath sounds.  Musculoskeletal:     Cervical back: Normal range of motion.  Skin:    General: Skin is warm.  Neurological:     General: No focal deficit present.     Mental Status: She is alert.  Psychiatric:        Mood and Affect: Mood normal.        Behavior: Behavior normal.         Assessment And Plan:  Hypertensive heart disease without heart failure Assessment & Plan: Chronic, currently on metoprolol. Feels well. No issues. Encouraged to follow low sodium diet.    Peripheral arterial disease (HCC) Assessment & Plan: Chronic, has elevated lipids. Currently not on meds. She is statin intolerant.    Mixed hyperlipidemia Assessment & Plan: Chronic, pt w/ familial hyperlipidemia, last LDL 201. She is intolerant of statin therapy. Does have underlying PAD. She is currently working with pharmacist to get patient assistance on PCSK-9 inhibitor.   Orders: -     TSH  Generalized anxiety disorder Assessment & Plan: Chronic, she has been on medication for ten plus years. She agrees to trial of decreasing dose to 30mg . Thirty day rx sent to the pharmacy. If tolerated, will then send 90 day supply and re-address at her next visit.    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 She agrees to repeat CT, order placed.   Orders: -     CT ADRENAL ABDOMEN WO CONTRAST; Future  Other orders -     PARoxetine HCl; Take 1 tablet (30 mg total) by mouth daily.  Dispense: 30 tablet; Refill: 2     Return per pt - pls move phys to Feb/March.  Patient was given opportunity to ask questions. Patient verbalized understanding of the plan and was able to repeat key elements of the plan. All questions were answered to their satisfaction.    I, Mary Aliment, MD, have reviewed all documentation for this visit. The documentation on 07/14/23 for the exam, diagnosis, procedures, and orders are all accurate and  complete.   IF YOU HAVE BEEN REFERRED TO A SPECIALIST, IT MAY TAKE 1-2 WEEKS TO SCHEDULE/PROCESS THE REFERRAL. IF YOU HAVE NOT HEARD FROM US/SPECIALIST IN TWO WEEKS, PLEASE GIVE [place] A CALL AT [phone] X [phone].   THE PATIENT IS ENCOURAGED TO PRACTICE SOCIAL DISTANCING DUE TO THE COVID-19 PANDEMIC.

## 2023-07-14 NOTE — Assessment & Plan Note (Signed)
Chronic, has elevated lipids. Currently not on meds. She is statin intolerant.

## 2023-07-15 LAB — TSH: TSH: 2.39 u[IU]/mL (ref 0.450–4.500)

## 2023-07-22 DIAGNOSIS — D3502 Benign neoplasm of left adrenal gland: Secondary | ICD-10-CM | POA: Diagnosis not present

## 2023-08-04 ENCOUNTER — Other Ambulatory Visit: Payer: Self-pay | Admitting: Internal Medicine

## 2023-08-11 ENCOUNTER — Ambulatory Visit (HOSPITAL_BASED_OUTPATIENT_CLINIC_OR_DEPARTMENT_OTHER): Payer: Medicare HMO | Admitting: Obstetrics & Gynecology

## 2023-08-15 DIAGNOSIS — D3502 Benign neoplasm of left adrenal gland: Secondary | ICD-10-CM | POA: Diagnosis not present

## 2023-08-22 ENCOUNTER — Ambulatory Visit (HOSPITAL_BASED_OUTPATIENT_CLINIC_OR_DEPARTMENT_OTHER): Payer: Medicare HMO | Admitting: Obstetrics & Gynecology

## 2023-09-05 DIAGNOSIS — H4010X1 Unspecified open-angle glaucoma, mild stage: Secondary | ICD-10-CM | POA: Diagnosis not present

## 2023-09-05 DIAGNOSIS — H401131 Primary open-angle glaucoma, bilateral, mild stage: Secondary | ICD-10-CM | POA: Diagnosis not present

## 2023-09-06 ENCOUNTER — Ambulatory Visit (HOSPITAL_BASED_OUTPATIENT_CLINIC_OR_DEPARTMENT_OTHER): Payer: Medicare HMO | Admitting: Obstetrics & Gynecology

## 2023-09-06 ENCOUNTER — Encounter (HOSPITAL_BASED_OUTPATIENT_CLINIC_OR_DEPARTMENT_OTHER): Payer: Self-pay | Admitting: Obstetrics & Gynecology

## 2023-09-06 VITALS — BP 152/72 | HR 79 | Ht 64.0 in | Wt 148.4 lb

## 2023-09-06 DIAGNOSIS — Z01411 Encounter for gynecological examination (general) (routine) with abnormal findings: Secondary | ICD-10-CM

## 2023-09-06 DIAGNOSIS — Z90710 Acquired absence of both cervix and uterus: Secondary | ICD-10-CM | POA: Diagnosis not present

## 2023-09-06 DIAGNOSIS — T7840XA Allergy, unspecified, initial encounter: Secondary | ICD-10-CM

## 2023-09-06 DIAGNOSIS — E278 Other specified disorders of adrenal gland: Secondary | ICD-10-CM

## 2023-09-06 DIAGNOSIS — Z780 Asymptomatic menopausal state: Secondary | ICD-10-CM

## 2023-09-06 MED ORDER — PREDNISONE 5 MG PO TABS
ORAL_TABLET | ORAL | 0 refills | Status: DC
Start: 2023-09-06 — End: 2023-11-08

## 2023-09-06 NOTE — Progress Notes (Unsigned)
77 y.o. G0P0000 Divorced White or Caucasian female here for breast and pelvic exam.  I am also following her for PMP status.  H/o pelvic pressure/pulling and had CT last year showing small renal cyst and adrenal lesion that was intermediate.  Follow up 1 year recommended.  She went to [facility] for additional evaluation as symptoms have continued.  She had the CT repeated there.  I do not have a copy of this.  Pelvis PT was recommended.    Unrelated, she started having a skin rash about four days ago.  It is very itchy and she thinks maybe it is due to a new body wash.  This on her arms, legs, chest.  Not really on torso or back.  She has not started any new medications.    From gyn standpoint, denies vaginal bleeding.  No urinary symptoms.    Patient's last menstrual period was 10/04/1984.          Sexually active: No.  H/O STD:  no  Health Maintenance: PCP:  Dr. Allyne Gee.   Vaccines are up to date:  yes Colonoscopy:  03/16/2023.  Does not need another one for screening. MMG:  04/11/2023 Negative BMD:  02/01/2022 Normal Last pap smear:  not indicated due to hysterectomy hx.   H/o abnormal pap smear:      reports that she quit smoking about 43 years ago. Her smoking use included cigarettes. She started smoking about 61 years ago. She has a 27 pack-year smoking history. She has never used smokeless tobacco. She reports that she does not drink alcohol and does not use drugs.  Past Medical History:  Diagnosis Date  . Allergy   . Anxiety    Chronic  . Arthritis    generalized  . Breast mass, left 2005   fatty on U/S  . Cataract    bilateral sx  . Fibroid 1987   TAH/USO  . GERD (gastroesophageal reflux disease)   . Glaucoma    on meds  . Hyperlipidemia    diet controlled  . Uses hearing aid    Bilateral   . Vitamin D deficiency     Past Surgical History:  Procedure Laterality Date  . ABDOMINAL HYSTERECTOMY  1989  . BALLOON DILATION N/A 03/05/2019   Procedure: BALLOON  DILATION;  Surgeon: Napoleon Form, MD;  Location: [facility] ENDOSCOPY;  Service: Endoscopy;  Laterality: N/A;  TTS balloon dilation  . CATARACT EXTRACTION W/ INTRAOCULAR LENS IMPLANT Bilateral 12/2015 01/2016  . COSMETIC SURGERY     Mini face lift  . ESOPHAGEAL MANOMETRY N/A 03/05/2019   Procedure: ESOPHAGEAL MANOMETRY (EM);  Surgeon: Napoleon Form, MD;  Location: [facility];  Service: Endoscopy;  Laterality: N/A;  . ESOPHAGOGASTRODUODENOSCOPY  01/2019  . ESOPHAGOGASTRODUODENOSCOPY (EGD) WITH PROPOFOL N/A 01/17/2019   Procedure: ESOPHAGOGASTRODUODENOSCOPY (EGD) WITH PROPOFOL;  Surgeon: Beverley Fiedler, MD;  Location: [facility] ENDOSCOPY;  Service: Gastroenterology;  Laterality: N/A;  . ESOPHAGOGASTRODUODENOSCOPY (EGD) WITH PROPOFOL N/A 03/05/2019   Procedure: ESOPHAGOGASTRODUODENOSCOPY (EGD) WITH PROPOFOL;  Surgeon: Napoleon Form, MD;  Location: [facility];  Service: Endoscopy;  Laterality: N/A;  . EYE SURGERY  06/2012   eye lift  . FOREIGN BODY REMOVAL  01/17/2019   Procedure: FOREIGN BODY REMOVAL;  Surgeon: Beverley Fiedler, MD;  Location: [facility] ENDOSCOPY;  Service: Gastroenterology;;  . LIPOMA EXCISION Left 2009   left arm  . STOMACH SURGERY  1997   Sphincter  . TONSILLECTOMY AND ADENOIDECTOMY  1972  . TOTAL ABDOMINAL HYSTERECTOMY W/  BILATERAL SALPINGOOPHORECTOMY  1989  . TRIGGER FINGER RELEASE     right    Current Outpatient Medications  Medication Sig Dispense Refill  . latanoprost (XALATAN) 0.005 % ophthalmic solution Place 1 drop into both eyes at bedtime.    . meloxicam (MOBIC) 15 MG tablet Take 1 tablet (15 mg total) by mouth as needed. 30 tablet 0  . metoprolol succinate (TOPROL-XL) 25 MG 24 hr tablet TAKE ONE TABLET BY MOUTH EVERY EVENING 90 tablet 0  . PARoxetine (PAXIL) 30 MG tablet Take 1 tablet (30 mg total) by mouth daily. 30 tablet 2  . VITAMIN D PO Take 1 tablet by mouth daily at 6 (six) AM.     No current facility-administered medications for this visit.    Family  History  Problem Relation Age of Onset  . Diabetes Mother   . Anxiety disorder Mother   . Depression Mother   . Hearing loss Mother   . Colon polyps Father 81  . Prostate cancer Father   . Colon cancer Father   . Alcohol abuse Father   . Cancer Father   . Diabetes Brother   . Prostate cancer Brother   . Cancer Brother   . COPD Sister   . Esophageal cancer Neg Hx   . Stomach cancer Neg Hx   . Rectal cancer Neg Hx     Review of Systems  Constitutional: Negative.   Gastrointestinal: Negative.   Allergic/Immunologic:       Itching and skin rash    Exam:   BP (!) 152/72 (BP Location: Right Arm, Patient Position: Sitting, Cuff Size: Normal)   Pulse 79   Ht 5\' 4"  (1.626 m)   Wt 148 lb 6.4 oz (67.3 kg)   LMP 10/04/1984   BMI 25.47 kg/m   Height: 5\' 4"  (162.6 cm)  General appearance: alert, cooperative and appears stated age Breasts: normal appearance, no masses or tenderness Abdomen: soft, non-tender; bowel sounds normal; no masses,  no organomegaly Lymph nodes: Cervical, supraclavicular, and axillary nodes normal.  No abnormal inguinal nodes palpated Neurologic: Grossly normal  Pelvic: External genitalia:  no lesions              Urethra:  normal appearing urethra with no masses, tenderness or lesions              Bartholins and Skenes: normal                 Vagina: normal appearing vagina with atrophic changes and no discharge, no lesions              Cervix: absent              Pap taken: No. Bimanual Exam:  Uterus:  uterus absent              Adnexa: no mass, fullness, tenderness               Rectovaginal: Confirms               Anus:  normal sphincter tone, no lesions     Chaperone, Ina Homes, CMA, was present for exam.  Assessment/Plan: There are no diagnoses linked to this encounter.

## 2023-09-07 ENCOUNTER — Ambulatory Visit: Payer: Medicare HMO | Admitting: Dermatology

## 2023-09-07 VITALS — BP 134/86 | HR 80

## 2023-09-07 DIAGNOSIS — R21 Rash and other nonspecific skin eruption: Secondary | ICD-10-CM

## 2023-09-07 DIAGNOSIS — L404 Guttate psoriasis: Secondary | ICD-10-CM | POA: Diagnosis not present

## 2023-09-07 DIAGNOSIS — D485 Neoplasm of uncertain behavior of skin: Secondary | ICD-10-CM

## 2023-09-07 MED ORDER — CLOBETASOL PROPIONATE 0.05 % EX CREA: 1.0000 | TOPICAL_CREAM | Freq: Two times a day (BID) | CUTANEOUS | 0 refills | Status: DC

## 2023-09-07 NOTE — Patient Instructions (Addendum)

## 2023-09-07 NOTE — Progress Notes (Signed)
   New Patient Visit   Subjective  Mary Krueger is a 77 y.o. female who presents for the following: Allergic Reaction  Patient states she has rash located at the scattered that she would like to have examined. Patient reports the areas have been there for 1 week. She reports the areas are bothersome. She reports the areas are itchy and burning. Patient rates irritation 8 out of 10. She states that the areas have spread. Patient reports she has not previously been treated for these areas. Patient reports she was prescribe prednisone taper. She is on her second dose today. She also reports she has tried a new shower gel right before the rash appeared.   The following portions of the chart were reviewed this encounter and updated as appropriate: medications, allergies, medical history  Review of Systems:  No other skin or systemic complaints except as noted in HPI or Assessment and Plan.  Objective  Well appearing patient in no apparent distress; mood and affect are within normal limits.  A full examination was performed including scalp, head, eyes, ears, nose, lips, neck, chest, axillae, abdomen, back, buttocks, bilateral upper extremities, bilateral lower extremities, hands, feet, fingers, toes, fingernails, and toenails. All findings within normal limits unless otherwise noted below.   Relevant exam findings are noted in the Assessment and Plan.    Assessment & Plan   Rash (Allergic Contact Dermatitis vs Psoriasis) - Assessment: Patient presents with erythematous scaly plaques scattered on the body, associated with severe itching and burning sensation. Symptoms have been present for several hours. Differential diagnosis includes allergic contact dermatitis and psoriasis, with recent use of a new body wash noted as a potential trigger. A biopsy is being performed to confirm the diagnosis. - Plan:   - Perform skin biopsy to differentiate between dermatitis and psoriasis.   - Obtain  photographs of arms, legs, chest, and back.   - Continue prednisone taper as previously prescribed.   - Prescribe clobetasol cream, to be applied twice daily for two weeks.   - Recommend CeraVe Anti-Itch lotion for daily use, to be applied before clobetasol.   - Suggest oatmeal baths with Aveeno packets for skin soothing.   - Follow-up appointment in two weeks for suture removal and biopsy review.   - Keep biopsy site clean with soap, water, and a small amount of Vaseline.  No follow-ups on file.    Documentation: I have reviewed the above documentation for accuracy and completeness, and I agree with the above.  Langston Reusing, DO

## 2023-09-08 ENCOUNTER — Encounter (HOSPITAL_BASED_OUTPATIENT_CLINIC_OR_DEPARTMENT_OTHER): Payer: Self-pay | Admitting: *Deleted

## 2023-09-08 LAB — SURGICAL PATHOLOGY

## 2023-09-15 ENCOUNTER — Encounter: Payer: Medicare HMO | Admitting: Internal Medicine

## 2023-09-21 ENCOUNTER — Ambulatory Visit: Payer: Medicare HMO | Admitting: Dermatology

## 2023-09-21 VITALS — BP 112/71

## 2023-09-21 DIAGNOSIS — L409 Psoriasis, unspecified: Secondary | ICD-10-CM | POA: Diagnosis not present

## 2023-09-21 MED ORDER — TACROLIMUS 0.1 % EX OINT: TOPICAL_OINTMENT | Freq: Every day | CUTANEOUS | 0 refills | Status: DC

## 2023-09-21 NOTE — Patient Instructions (Signed)

## 2023-09-21 NOTE — Progress Notes (Signed)
   Follow-Up Visit   Subjective  Mary Krueger is a 77 y.o. female who presents for the following: psoriasis   Patient present today for follow up visit for psoriasis . Patient was last evaluated on 09/07/23. Bx was done to confirm Dx. Rx clobetasol 0.05% cream and OTC CeraVe Itch Relief moisturizing lotion. Patient reports sxs are better. Patient denies medication changes.  The following portions of the chart were reviewed this encounter and updated as appropriate: medications, allergies, medical history  Review of Systems:  No other skin or systemic complaints except as noted in HPI or Assessment and Plan.  Objective  Well appearing patient in no apparent distress; mood and affect are within normal limits.  A focused examination was performed of the following areas: posterior right arm   Relevant exam findings are noted in the Assessment and Plan.    Assessment & Plan  PSORIASIS Exam: Well-demarcated erythematous papules/plaques with silvery scale, guttate pink scaly papules.  improved  patient denies joint pain  Psoriasis is a chronic non-curable, but treatable genetic/hereditary disease that may have other systemic features affecting other organ systems such as joints (Psoriatic Arthritis). It is associated with an increased risk of inflammatory bowel disease, heart disease, non-alcoholic fatty liver disease, and depression.  Treatments include light and laser treatments; topical medications; and systemic medications including oral and injectables.  Treatment Plan: - Rx tacrolimus 0.1% cream to use on the area when taking a break from clobetasol.      PSORIASIS   Related Medications tacrolimus (PROTOPIC) 0.1 % ointment Apply topically daily. Use on affected areas for 2 weeks while on break from using clobetasol.  No follow-ups on file.    Documentation: I have reviewed the above documentation for accuracy and completeness, and I agree with the above.   I,  Shirron Marcha Solders, CMA, am acting as scribe for [employer], DO.   Langston Reusing, DO

## 2023-10-26 ENCOUNTER — Other Ambulatory Visit: Payer: Self-pay | Admitting: Internal Medicine

## 2023-10-27 ENCOUNTER — Encounter (HOSPITAL_BASED_OUTPATIENT_CLINIC_OR_DEPARTMENT_OTHER): Payer: Self-pay | Admitting: Family

## 2023-10-27 ENCOUNTER — Ambulatory Visit (INDEPENDENT_AMBULATORY_CARE_PROVIDER_SITE_OTHER): Payer: Medicare HMO | Admitting: Family

## 2023-10-27 VITALS — BP 124/69 | HR 75 | Ht 64.0 in | Wt 148.4 lb

## 2023-10-27 DIAGNOSIS — I739 Peripheral vascular disease, unspecified: Secondary | ICD-10-CM | POA: Diagnosis not present

## 2023-10-27 DIAGNOSIS — I10 Essential (primary) hypertension: Secondary | ICD-10-CM | POA: Diagnosis not present

## 2023-10-27 DIAGNOSIS — E7849 Other hyperlipidemia: Secondary | ICD-10-CM

## 2023-10-27 NOTE — Progress Notes (Signed)
Cardiology Office Note:  .   Date:  10/27/2023  ID:  Mary Krueger, DOB [birth year], MRN [id number] PCP: Dorothyann Peng, MD  [facility] Providers Cardiologist:  Chilton Si, MD    History of Present Illness: .   Mary Krueger is a 78 y.o. 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   Previously evaluated in 2018 for palpitations by Dr. Antoine Poche with no workup recommended at that time.  Subsequently established with Dr. Duke Salvia for treatment of hyperlipidemia.  She has previous intolerance to atorvastatin, rosuvastatin, simvastatin with myalgias.  She was recommended for initiation of PCSK9 was started on Praluent with good response.  Prior ABI by insurance company 03/2021 with results of 0.74 on left and 0.89 on the right.  Repeat study 04/2022 right ABI 1.08, left ABI 1.11 with no evidence of PAD.   Last seen 04/2022 noted to be off Praluent due to lapse in grant coverage and was recommended to resume.  Visit with PCP 07/2023 with CT abdomen pelvis 08/23/2022 with adrenal mass recommended for repeat CT.  Presents today for follow up. Notes a few months ago she started having pain in her back that radiates down her right leg in particular. Most pain in her right calf. She attributes some of this pain to sciatica. Notes the back does feel better more recently. Notes this improves as she continues walking. She has been dealing with a persistent whole body rash which she is following with dermatology and diagnosed with dermatitis. She did not hear back about patient assistance for Praluent from manufacturer.  Reports no shortness of breath nor dyspnea on exertion. Reports no chest pain, pressure, or tightness. No edema, orthopnea, PND. Reports no palpitations.    ROS: Please see the history of present illness.    All other systems reviewed and are negative.   Studies Reviewed: Marland Kitchen   EKG Interpretation Date/Time:  Thursday October 27 2023  08:33:47 EST Ventricular Rate:  75 PR Interval:  152 QRS Duration:  66 QT Interval:  360 QTC Calculation: 402 R Axis:   50  Text Interpretation: Normal sinus rhythm Confirmed by Gillian Shields ([id number]) on 10/27/2023 8:48:13 AM        Risk Assessment/Calculations:             Physical Exam:   VS:  BP 124/69 (BP Location: Right Arm)   Pulse 75   Ht 5\' 4"  (1.626 m)   Wt 148 lb 6.4 oz (67.3 kg)   LMP 10/04/1984   SpO2 97%   BMI 25.47 kg/m    Wt Readings from Last 3 Encounters:  10/27/23 148 lb 6.4 oz (67.3 kg)  09/06/23 148 lb 6.4 oz (67.3 kg)  07/14/23 144 lb (65.3 kg)    GEN: Well nourished, well developed in no acute distress NECK: No JVD; No carotid bruits CARDIAC: RRR, no murmurs, rubs, gallops RESPIRATORY:  Clear to auscultation without rales, wheezing or rhonchi  ABDOMEN: Soft, non-tender, non-distended EXTREMITIES:  No edema; No deformity   ASSESSMENT AND PLAN: .    Sciatia - Back pain radiating down leg consistent with sciatica. Improved with ambulation. Discussed benefits of staying active, stretches for sciatica. Handout provided.   PAD -No claudication. No indication for repeat ABI. Less active in cold weather. Refer to PREP exercise program.   HLD, LDL <70 / Familial hyperlipidemia -03/2023 total cholesterol 282, LDL 201. Longstanding history of hyperlipidemia with LDL >190 diagnostic of familial hyperlipidemia. Previously had good  control on Praluent but was cost prohibitive. Will route to PharmD team for assistance with [employer] grant (has had previously)   HTN - BP well controlled. Continue current antihypertensive regimen.         Dispo: follow up in 1 year   Signed, Alver Sorrow, NP

## 2023-10-27 NOTE — Patient Instructions (Signed)
Medication Instructions:   We will submit Mary Krueger for Praluent and let you know when approved Our goal is to resume Praluent injections every 2 weeks  *If you need a refill on your cardiac medications before your next appointment, please call your pharmacy*  Testing/Procedures: Your EKG looked good today!  Follow-Up: At [facility], you and your Mary needs are our priority.  As part of our continuing mission to provide you with exceptional heart care, we have created designated Provider Care Teams.  These Care Teams include your primary Cardiologist (physician) and Advanced Practice Providers (APPs -  Physician Assistants and Nurse Practitioners) who all work together to provide you with the care you need, when you need it.  We recommend signing up for the patient portal called "MyChart".  Sign up information is provided on this After Visit Summary.  MyChart is used to connect with patients for Virtual Visits (Telemedicine).  Patients are able to view lab/test results, encounter notes, upcoming appointments, etc.  Non-urgent messages can be sent to your provider as well.   To learn more about what you can do with MyChart, go to [email or web address].    Your next appointment:   1 year(s)  Provider:   Chilton Si, MD    Other Instructions

## 2023-10-28 ENCOUNTER — Telehealth: Payer: Self-pay

## 2023-10-28 NOTE — Telephone Encounter (Signed)
Called to discuss PREP program, would like to attend next class on February 18, every T/Th 10-11:15; will contact early Feb to confirm and set up assessment visit.

## 2023-10-31 DIAGNOSIS — M6281 Muscle weakness (generalized): Secondary | ICD-10-CM | POA: Diagnosis not present

## 2023-10-31 DIAGNOSIS — M545 Low back pain, unspecified: Secondary | ICD-10-CM | POA: Diagnosis not present

## 2023-10-31 DIAGNOSIS — R102 Pelvic and perineal pain: Secondary | ICD-10-CM | POA: Diagnosis not present

## 2023-11-02 ENCOUNTER — Telehealth: Payer: Self-pay | Admitting: Pharmacy Technician

## 2023-11-02 ENCOUNTER — Other Ambulatory Visit: Payer: Self-pay | Admitting: Internal Medicine

## 2023-11-02 ENCOUNTER — Other Ambulatory Visit (HOSPITAL_COMMUNITY): Payer: Self-pay

## 2023-11-02 NOTE — Telephone Encounter (Signed)
-----   Message from Cheree Ditto sent at 11/02/2023  8:54 AM EST ----- Please complete PA for Praluent (or Repatha if preferred) ----- Message ----- From: Alver Sorrow, NP Sent: 10/27/2023   9:14 AM EST To: Cheree Ditto, RPH  Apparently praluent PAP never got back to [place] after sending. Could we submit for [employer] ? She's had previously.

## 2023-11-02 NOTE — Telephone Encounter (Signed)
Pharmacy Patient Advocate Encounter   Received notification from Physician's Office that prior authorization for repatha is required/requested.   Insurance verification completed.   The patient is insured through [employer] .   Per test claim: PA required; PA submitted to above mentioned insurance via CoverMyMeds Key/confirmation #/EOC [id number] Status is pending

## 2023-11-02 NOTE — Telephone Encounter (Signed)
Pharmacy Patient Advocate Encounter  Received notification from [employer] that Prior Authorization for repatha has been APPROVED from 11/02/23 to 10/03/24. Ran test claim, Copay is $144.24 one month (DEDUCTIBLE). This test claim was processed through [facility]- copay amounts may vary at other pharmacies due to pharmacy/plan contracts, or as the patient moves through the different stages of their insurance plan.   PA #/Case ID/Reference #: [id number]

## 2023-11-08 ENCOUNTER — Encounter: Payer: Self-pay | Admitting: Internal Medicine

## 2023-11-08 ENCOUNTER — Ambulatory Visit (INDEPENDENT_AMBULATORY_CARE_PROVIDER_SITE_OTHER): Payer: Medicare HMO | Admitting: Internal Medicine

## 2023-11-08 VITALS — BP 118/78 | HR 98 | Temp 98.1°F | Ht 64.0 in | Wt 149.2 lb

## 2023-11-08 DIAGNOSIS — E278 Other specified disorders of adrenal gland: Secondary | ICD-10-CM | POA: Diagnosis not present

## 2023-11-08 DIAGNOSIS — Z0000 Encounter for general adult medical examination without abnormal findings: Secondary | ICD-10-CM | POA: Diagnosis not present

## 2023-11-08 DIAGNOSIS — M5431 Sciatica, right side: Secondary | ICD-10-CM | POA: Diagnosis not present

## 2023-11-08 DIAGNOSIS — I739 Peripheral vascular disease, unspecified: Secondary | ICD-10-CM | POA: Diagnosis not present

## 2023-11-08 DIAGNOSIS — L409 Psoriasis, unspecified: Secondary | ICD-10-CM | POA: Diagnosis not present

## 2023-11-08 DIAGNOSIS — G720 Drug-induced myopathy: Secondary | ICD-10-CM

## 2023-11-08 DIAGNOSIS — F411 Generalized anxiety disorder: Secondary | ICD-10-CM

## 2023-11-08 DIAGNOSIS — T466X5A Adverse effect of antihyperlipidemic and antiarteriosclerotic drugs, initial encounter: Secondary | ICD-10-CM | POA: Diagnosis not present

## 2023-11-08 DIAGNOSIS — I119 Hypertensive heart disease without heart failure: Secondary | ICD-10-CM | POA: Diagnosis not present

## 2023-11-08 HISTORY — DX: Encounter for general adult medical examination without abnormal findings: Z00.00

## 2023-11-08 LAB — POCT URINALYSIS DIPSTICK
Bilirubin, UA: NEGATIVE
Glucose, UA: NEGATIVE
Ketones, UA: NEGATIVE
Leukocytes, UA: NEGATIVE
Nitrite, UA: NEGATIVE
Protein, UA: NEGATIVE
Spec Grav, UA: 1.02 (ref 1.010–1.025)
Urobilinogen, UA: 0.2 U/dL
pH, UA: 5.5 (ref 5.0–8.0)

## 2023-11-08 MED ORDER — BACLOFEN 10 MG PO TABS: ORAL_TABLET | ORAL | 0 refills | Status: DC

## 2023-11-08 MED ORDER — TRIAMCINOLONE ACETONIDE 40 MG/ML IJ SUSP
40.0000 mg | Freq: Once | INTRAMUSCULAR | Status: AC
  Administered 2023-11-08: 40 mg via INTRAMUSCULAR

## 2023-11-08 MED ORDER — PAROXETINE HCL 20 MG PO TABS: 20.0000 mg | ORAL_TABLET | Freq: Every day | ORAL | 2 refills | Status: DC

## 2023-11-08 MED ORDER — MELOXICAM 15 MG PO TABS: 15.0000 mg | ORAL_TABLET | ORAL | 0 refills | Status: DC | PRN

## 2023-11-08 NOTE — Assessment & Plan Note (Signed)

## 2023-11-08 NOTE — Assessment & Plan Note (Signed)
She was given Kenalog, 40mg  IM x1. I will send rx baclofen to use prn. She is encouraged to perform stretching exercises to help alleviate sx. I offered PT if sx do not improve, she declines at this time.

## 2023-11-08 NOTE — Patient Instructions (Signed)

## 2023-11-08 NOTE — Assessment & Plan Note (Addendum)
Chronic, has elevated lipids. Unfortunately, she is statin intolerant. Cardiology has started process to get Praluent approved again.

## 2023-11-08 NOTE — Assessment & Plan Note (Signed)
Chronic, currently on metoprolol. Feels well. No issues. Encouraged to follow low sodium diet.

## 2023-11-08 NOTE — Assessment & Plan Note (Addendum)
Derm notes reviewed, pt actually diagnosed with psoriasis. She has been prescribed tacrolimus to use topically.

## 2023-11-08 NOTE — Assessment & Plan Note (Signed)
 Chronic, she has been on medication for ten plus years. She has done well with paroxetine  30mg . She denies having any mood changes.  She agrees to try to further lower the dose to 20mg  daily.  She will start lower dose once she has completed 30mg  dose supply.

## 2023-11-08 NOTE — Assessment & Plan Note (Signed)
CT abd/pelvis results from Nov 2024 reviewed in detail. She is also followed by Urology, they are ordering yearly imaging studies.

## 2023-11-08 NOTE — Progress Notes (Signed)
I,Victoria T Deloria Lair, CMA,acting as a Neurosurgeon for Gwynneth Aliment, MD.,have documented all relevant documentation on the behalf of Gwynneth Aliment, MD,as directed by  Gwynneth Aliment, MD while in the presence of Gwynneth Aliment, MD.  Subjective:    Patient ID: Mary Krueger , female    DOB: [birth year] , 78 y.o.   MRN: [id number]  Chief Complaint  Patient presents with   Annual Exam   Hypertension   Hyperlipidemia    HPI  Patient here for annual exam. She is followed by Dr.Miller for her GYN exams. She reports compliance with meds. She denies headaches, chest pain and shortness of breath.   She complains of back pain. She states the pain starts at her lower back, and then radiates down her right leg into her foot. She does feel numbness & tingling in her right foot.     Hypertension This is a chronic problem. The current episode started more than 1 year ago. The problem has been gradually improving since onset. The problem is controlled. Pertinent negatives include no blurred vision, chest pain, headaches, orthopnea or shortness of breath. Risk factors for coronary artery disease include post-menopausal state. Past treatments include beta blockers. There are no compliance problems.      Past Medical History:  Diagnosis Date   Allergy    Annual physical exam 11/08/2023   Anxiety    Chronic   Arthritis    generalized   Breast mass, left 2005   fatty on U/S   Cataract    bilateral sx   Fibroid 1987   TAH/USO   GERD (gastroesophageal reflux disease)    Glaucoma    on meds   Hyperlipidemia    diet controlled   Uses hearing aid    Bilateral    Vitamin D deficiency      Family History  Problem Relation Age of Onset   Diabetes Mother    Anxiety disorder Mother    Depression Mother    Hearing loss Mother    Colon polyps Father 60   Prostate cancer Father    Colon cancer Father    Alcohol abuse Father    Cancer Father    Diabetes Brother    Prostate cancer Brother     Cancer Brother    COPD Sister    Esophageal cancer Neg Hx    Stomach cancer Neg Hx    Rectal cancer Neg Hx      Current Outpatient Medications:    baclofen (LIORESAL) 10 MG tablet, One tab po at bedtime prn, Disp: 30 tablet, Rfl: 0   latanoprost (XALATAN) 0.005 % ophthalmic solution, Place 1 drop into both eyes at bedtime., Disp: , Rfl:    metoprolol succinate (TOPROL-XL) 25 MG 24 hr tablet, TAKE ONE TABLET BY MOUTH EVERY EVENING, Disp: 90 tablet, Rfl: 0   VITAMIN D PO, Take 1 tablet by mouth daily at 6 (six) AM., Disp: , Rfl:    clobetasol cream (TEMOVATE) 0.05 %, Apply 1 Application topically 2 (two) times daily. Apply 2 times a day for 2 weeks then STOP, Disp: 60 g, Rfl: 0   meloxicam (MOBIC) 15 MG tablet, Take 1 tablet (15 mg total) by mouth as needed., Disp: 30 tablet, Rfl: 0   PARoxetine (PAXIL) 20 MG tablet, Take 1 tablet (20 mg total) by mouth daily., Disp: 30 tablet, Rfl: 2   tacrolimus (PROTOPIC) 0.1 % ointment, Apply topically daily. Use on affected areas for 2 weeks while on break  from using clobetasol., Disp: 100 g, Rfl: 0   Allergies  Allergen Reactions   Paxlovid [Nirmatrelvir-Ritonavir] Diarrhea   Simvastatin Other (See Comments)    myalgias   Atorvastatin Other (See Comments)    myalgias   Rosuvastatin Other (See Comments)    myalgias      The patient states she uses post menopausal status for birth control. Patient's last menstrual period was 10/04/1984.. Negative for Dysmenorrhea. Negative for: breast discharge, breast lump(s), breast pain and breast self exam. Associated symptoms include abnormal vaginal bleeding. Pertinent negatives include abnormal bleeding (hematology), anxiety, decreased libido, depression, difficulty falling sleep, dyspareunia, history of infertility, nocturia, sexual dysfunction, sleep disturbances, urinary incontinence, urinary urgency, vaginal discharge and vaginal itching. Diet regular.The patient states her exercise level is  intermittent,  she has also been referred to PREP program by Cardiology.  Social History   Substance and Sexual Activity  Alcohol Use No   . The patient's tobacco use is:  Social History   Tobacco Use  Smoking Status Former   Current packs/day: 0.00   Average packs/day: 1.5 packs/day for 18.0 years (27.0 ttl pk-yrs)   Types: Cigarettes   Start date: 18   Quit date: 10/05/1979   Years since quitting: 44.1  Smokeless Tobacco Never  Tobacco Comments   quit in 1981  . She has been exposed to passive smoke. The patient's alcohol use is:  Social History   Substance and Sexual Activity  Alcohol Use No   Review of Systems  Constitutional: Negative.   HENT: Negative.    Eyes: Negative.  Negative for blurred vision.  Respiratory: Negative.  Negative for shortness of breath.   Cardiovascular: Negative.  Negative for chest pain and orthopnea.  Gastrointestinal: Negative.   Endocrine: Negative.   Genitourinary: Negative.   Musculoskeletal:  Positive for back pain.  Skin:  Positive for rash.       She has recently been eval by Derm for generalized rash. Sx started about a month ago. She is not sure what may have triggered her sx. Originally evaluated by GYN; she was prescribed prednisone, but admits she didn't take the full course. Derm has done biopsy, states dx is dermatitis. Most bothersome sx is itching.   Allergic/Immunologic: Negative.   Neurological: Negative.  Negative for headaches.  Hematological: Negative.   Psychiatric/Behavioral: Negative.       Today's Vitals   11/08/23 1157  BP: 118/78  Pulse: 98  Temp: 98.1 F (36.7 C)  SpO2: 98%  Weight: 149 lb 3.2 oz (67.7 kg)  Height: 5\' 4"  (1.626 m)   Body mass index is 25.61 kg/m.  Wt Readings from Last 3 Encounters:  11/08/23 149 lb 3.2 oz (67.7 kg)  10/27/23 148 lb 6.4 oz (67.3 kg)  09/06/23 148 lb 6.4 oz (67.3 kg)     Objective:  Physical Exam Vitals and nursing note reviewed.  Constitutional:      Appearance: Normal  appearance.  HENT:     Head: Normocephalic and atraumatic.     Right Ear: Tympanic membrane, ear canal and external ear normal.     Left Ear: Tympanic membrane, ear canal and external ear normal.     Nose: Nose normal.     Mouth/Throat:     Mouth: Mucous membranes are moist.     Pharynx: Oropharynx is clear.  Eyes:     Extraocular Movements: Extraocular movements intact.     Conjunctiva/sclera: Conjunctivae normal.     Pupils: Pupils are equal, round, and reactive to  light.  Cardiovascular:     Rate and Rhythm: Normal rate and regular rhythm.     Pulses: Normal pulses.     Heart sounds: Normal heart sounds.  Pulmonary:     Effort: Pulmonary effort is normal.     Breath sounds: Normal breath sounds.  Chest:  Breasts:    Tanner Score is 5.     Right: Normal.     Left: Normal.  Abdominal:     General: Abdomen is flat. Bowel sounds are normal.     Palpations: Abdomen is soft.  Genitourinary:    Comments: deferred Musculoskeletal:        General: Tenderness present. Normal range of motion.     Cervical back: Normal range of motion and neck supple.     Comments: Neg straight leg test  Skin:    General: Skin is warm and dry.     Comments: Generalized erythematous, papular, scaly rash on anterior chest, arms, back She wore pants during exam, legs not examined.   Neurological:     General: No focal deficit present.     Mental Status: She is alert and oriented to person, place, and time.  Psychiatric:        Mood and Affect: Mood normal.        Behavior: Behavior normal.         Assessment And Plan:     Annual physical exam Assessment & Plan: A full exam was performed.  Importance of monthly self breast exams was discussed with the patient.  She is advised to get 3045 minutes of regular exercise, no less than four to five days per week. Both weight-bearing and aerobic exercises are recommended.  She is advised to follow a healthy diet with at least six fruits/veggies per  day, decrease intake of red meat and other saturated fats and to increase fish intake to twice weekly.  Meats/fish should not be fried -- baked, boiled or broiled is preferable. It is also important to cut back on your sugar intake.  Be sure to read labels - try to avoid anything with added sugar, high fructose corn syrup or other sweeteners.  If you must use a sweetener, you can try stevia or monkfruit.  It is also important to avoid artificially sweetened foods/beverages and diet drinks. Lastly, wear SPF 50 sunscreen on exposed skin and when in direct sunlight for an extended period of time.  Be sure to avoid fast food restaurants and aim for at least 60 ounces of water daily.       Hypertensive heart disease without heart failure Assessment & Plan: Chronic, currently on metoprolol. Feels well. No issues. Encouraged to follow low sodium diet.   Orders: -     POCT urinalysis dipstick -     Microalbumin / creatinine urine ratio -     CMP14+EGFR -     Lipid panel -     CBC with Differential/Platelet  Peripheral arterial disease (HCC) Assessment & Plan: Chronic, has elevated lipids. Unfortunately, she is statin intolerant. Cardiology has started process to get Praluent approved again.    Generalized anxiety disorder Assessment & Plan: Chronic, she has been on medication for ten plus years. She has done well with paroxetine 30mg . She denies having any mood changes.  She agrees to try to further lower the dose to 20mg  daily.  She will start lower dose once she has completed 30mg  dose supply.     Psoriasis Assessment & Plan: Derm notes reviewed, pt  actually diagnosed with psoriasis. She has been prescribed tacrolimus to use topically.    Right sciatic nerve pain Assessment & Plan: She was given Kenalog, 40mg  IM x1. I will send rx baclofen to use prn. She is encouraged to perform stretching exercises to help alleviate sx. I offered PT if sx do not improve, she declines at this time.    Orders: -     Meloxicam; Take 1 tablet (15 mg total) by mouth as needed.  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 She is also followed by Urology, they are ordering yearly imaging studies.    Statin myopathy  Other orders -     PARoxetine HCl; Take 1 tablet (20 mg total) by mouth daily.  Dispense: 30 tablet; Refill: 2 -     Baclofen; One tab po at bedtime prn  Dispense: 30 tablet; Refill: 0     Return for 1 year HM, 6 month chol f/u. Marland Kitchen Patient was given opportunity to ask questions. Patient verbalized understanding of the plan and was able to repeat key elements of the plan. All questions were answered to their satisfaction.   I, Gwynneth Aliment, MD, have reviewed all documentation for this visit. The documentation on 11/08/23 for the exam, diagnosis, procedures, and orders are all accurate and complete.

## 2023-11-09 ENCOUNTER — Other Ambulatory Visit: Payer: Self-pay

## 2023-11-09 LAB — CBC WITH DIFFERENTIAL/PLATELET
Basophils Absolute: 0.1 10*3/uL (ref 0.0–0.2)
Basos: 1 %
EOS (ABSOLUTE): 0.2 10*3/uL (ref 0.0–0.4)
Eos: 3 %
Hematocrit: 44.1 % (ref 34.0–46.6)
Hemoglobin: 15.1 g/dL (ref 11.1–15.9)
Immature Grans (Abs): 0 10*3/uL (ref 0.0–0.1)
Immature Granulocytes: 0 %
Lymphocytes Absolute: 1.5 10*3/uL (ref 0.7–3.1)
Lymphs: 22 %
MCH: 31.9 pg (ref 26.6–33.0)
MCHC: 34.2 g/dL (ref 31.5–35.7)
MCV: 93 fL (ref 79–97)
Monocytes Absolute: 0.5 10*3/uL (ref 0.1–0.9)
Monocytes: 7 %
Neutrophils Absolute: 4.4 10*3/uL (ref 1.4–7.0)
Neutrophils: 67 %
Platelets: 400 10*3/uL (ref 150–450)
RBC: 4.73 x10E6/uL (ref 3.77–5.28)
RDW: 11.8 % (ref 11.7–15.4)
WBC: 6.7 10*3/uL (ref 3.4–10.8)

## 2023-11-09 LAB — CMP14+EGFR
ALT: 27 [IU]/L (ref 0–32)
AST: 26 [IU]/L (ref 0–40)
Albumin: 4.5 g/dL (ref 3.8–4.8)
Alkaline Phosphatase: 91 [IU]/L (ref 44–121)
BUN/Creatinine Ratio: 16 (ref 12–28)
BUN: 15 mg/dL (ref 8–27)
Bilirubin Total: 0.2 mg/dL (ref 0.0–1.2)
CO2: 26 mmol/L (ref 20–29)
Calcium: 9.3 mg/dL (ref 8.7–10.3)
Chloride: 100 mmol/L (ref 96–106)
Creatinine, Ser: 0.96 mg/dL (ref 0.57–1.00)
Globulin, Total: 2.3 g/dL (ref 1.5–4.5)
Glucose: 85 mg/dL (ref 70–99)
Potassium: 4.5 mmol/L (ref 3.5–5.2)
Sodium: 140 mmol/L (ref 134–144)
Total Protein: 6.8 g/dL (ref 6.0–8.5)
eGFR: 60 mL/min/{1.73_m2} (ref >59)

## 2023-11-09 LAB — MICROALBUMIN / CREATININE URINE RATIO
Creatinine, Urine: 141.3 mg/dL
Microalb/Creat Ratio: 17 mg/g{creat} (ref 0–29)
Microalbumin, Urine: 23.4 ug/mL

## 2023-11-09 LAB — LIPID PANEL
Chol/HDL Ratio: 5.9 {ratio} — ABNORMAL HIGH (ref 0.0–4.4)
Cholesterol, Total: 342 mg/dL — ABNORMAL HIGH (ref 100–199)
HDL: 58 mg/dL (ref >39)
LDL Chol Calc (NIH): 252 mg/dL — ABNORMAL HIGH (ref 0–99)
Triglycerides: 167 mg/dL — ABNORMAL HIGH (ref 0–149)
VLDL Cholesterol Cal: 32 mg/dL (ref 5–40)

## 2023-11-09 MED ORDER — BACLOFEN 20 MG PO TABS: ORAL_TABLET | ORAL | 0 refills | Status: DC

## 2023-11-09 MED ORDER — PAROXETINE HCL 20 MG PO TABS: 20.0000 mg | ORAL_TABLET | Freq: Every day | ORAL | 2 refills | Status: DC

## 2023-11-11 ENCOUNTER — Telehealth: Payer: Self-pay

## 2023-11-11 NOTE — Telephone Encounter (Signed)
 Called to confirm participation in next PREP class at [facility] on 2/18; assessment visit scheduled for 2/11 at 11am

## 2023-11-15 NOTE — Progress Notes (Signed)
[facility] PREP Evaluation  Patient Details  Name: Mary Krueger MRN: [id number] Date of Birth: December 03, 1945 Age: 78 y.o. PCP: Mary Peng, MD  Vitals:   11/15/23 1147  BP: (!) 140/80  Pulse: 78  SpO2: 98%  Weight: 146 lb (66.2 kg)     [facility] Eval - 11/15/23 1100       [facility] "PREP" Location   [facility] "PREP" Location [facility]      Referral    Referring Provider Mary Krueger    Reason for referral Hypertension;High Cholesterol    Program Start Date 11/22/23      Measurement   Waist Circumference 35.5 inches    Hip Circumference 40 inches    Body fat 41 percent      Information for Trainer   Goals --   Establish strength training program to build muscle, increase stamina   Current Exercise walking    Orthopedic Concerns --   recent injection for sciatica; bursitis in shoulders   Pertinent Medical History --   hypercholesterolemai, HTN   Medications that affect exercise Beta blocker      Timed Up and Go (TUGS)   Timed Up and Go Low risk <9 seconds      Mobility and Daily Activities   I find it easy to walk up or down two or more flights of stairs. 4    I have no trouble taking out the trash. 4    I do housework such as vacuuming and dusting on my own without difficulty. 4    I can easily lift a gallon of milk (8lbs). 4    I can easily walk a mile. 4    I have no trouble reaching into high cupboards or reaching down to pick up something from the floor. 4    I do not have trouble doing out-door work such as Loss adjuster, chartered, raking leaves, or gardening. 2      Mobility and Daily Activities   I feel younger than my age. 4    I feel independent. 4    I feel energetic. 3    I live an active life.  4    I feel strong. 4    I feel healthy. 4    I feel active as other people my age. 4      How fit and strong are you.   Fit and Strong Total Score 53            Past Medical History:  Diagnosis Date   Allergy    Annual physical exam 11/08/2023   Anxiety    Chronic    Arthritis    generalized   Breast mass, left 2005   fatty on U/S   Cataract    bilateral sx   Fibroid 1987   TAH/USO   GERD (gastroesophageal reflux disease)    Glaucoma    on meds   Hyperlipidemia    diet controlled   Uses hearing aid    Bilateral    Vitamin D deficiency    Past Surgical History:  Procedure Laterality Date   ABDOMINAL HYSTERECTOMY  1989   BALLOON DILATION N/A 03/05/2019   Procedure: BALLOON DILATION;  Surgeon: Mary Form, MD;  Location: [facility] END[facility]COPY;  Service: Endoscopy;  Laterality: N/A;  TTS balloon dilation   CATARACT EXTRACTION W/ INTRAOCULAR LENS IMPLANT Bilateral 12/2015 01/2016   COSMETIC SURGERY     Mini face lift   ESOPHAGEAL MANOMETRY N/A 03/05/2019   Procedure: ESOPHAGEAL  MANOMETRY (EM);  Surgeon: Mary Form, MD;  Location: [facility];  Service: Endoscopy;  Laterality: N/A;   ESOPHAGOGASTRODUODENOSCOPY  01/2019   ESOPHAGOGASTRODUODENOSCOPY (EGD) WITH PROPOFOL N/A 01/17/2019   Procedure: ESOPHAGOGASTRODUODENOSCOPY (EGD) WITH PROPOFOL;  Surgeon: Mary Fiedler, MD;  Location: [facility] ENDOSCOPY;  Service: Gastroenterology;  Laterality: N/A;   ESOPHAGOGASTRODUODENOSCOPY (EGD) WITH PROPOFOL N/A 03/05/2019   Procedure: ESOPHAGOGASTRODUODENOSCOPY (EGD) WITH PROPOFOL;  Surgeon: Mary Form, MD;  Location: [facility];  Service: Endoscopy;  Laterality: N/A;   EYE SURGERY  06/2012   eye lift   FOREIGN BODY REMOVAL  01/17/2019   Procedure: FOREIGN BODY REMOVAL;  Surgeon: Mary Fiedler, MD;  Location: [facility] ENDOSCOPY;  Service: Gastroenterology;;   LIPOMA EXCISION Left 2009   left arm   STOMACH SURGERY  1997   Sphincter   TONSILLECTOMY AND ADENOIDECTOMY  1972   TOTAL ABDOMINAL HYSTERECTOMY W/ BILATERAL SALPINGOOPHORECTOMY  1989   TRIGGER FINGER RELEASE     right   Social History   Tobacco Use  Smoking Status Former   Current packs/day: 0.00   Average packs/day: 1.5 packs/day for 18.0 years (27.0 ttl pk-yrs)   Types: Cigarettes    Start date: 42   Quit date: 10/05/1979   Years since quitting: 44.1  Smokeless Tobacco Never  Tobacco Comments   quit in 1981  To begin PREP class at [facility] on 2/18, every T/Th 10-11:15  Mary Krueger Mary Krueger 11/15/2023, 11:50 AM

## 2023-11-18 ENCOUNTER — Telehealth: Payer: Self-pay | Admitting: Family

## 2023-11-18 NOTE — Telephone Encounter (Signed)
Patient was calling in regards to her Repatha medication

## 2023-11-18 NOTE — Telephone Encounter (Signed)
Left message for patient to call back

## 2023-11-21 NOTE — Telephone Encounter (Signed)
 Left message to call back

## 2023-11-22 ENCOUNTER — Telehealth: Payer: Self-pay

## 2023-11-22 ENCOUNTER — Other Ambulatory Visit (HOSPITAL_COMMUNITY): Payer: Self-pay

## 2023-11-22 NOTE — Telephone Encounter (Signed)
Re enrolled in grant. Patient notified via mychart.

## 2023-11-22 NOTE — Telephone Encounter (Signed)
Enrolling in grant, see separate encounter

## 2023-11-22 NOTE — Telephone Encounter (Signed)
Patient Advocate Encounter   The patient was approved for a [employer] grant that will help cover the cost of REPATHA Total amount awarded, $2,500.  Effective: 10/23/23 - 10/21/24   [id number] [id number]   Patient informed via Dorcas Carrow, CPhT  Pharmacy Patient Advocate Specialist  Direct Number: [phone] Fax: [phone]

## 2023-11-25 MED ORDER — REPATHA SURECLICK 140 MG/ML ~~LOC~~ SOAJ
140.0000 mg | SUBCUTANEOUS | 3 refills | Status: DC
Start: 2023-11-25 — End: 2024-05-29

## 2023-11-25 NOTE — Telephone Encounter (Signed)
Patient is calling to to get Repatha called in to pharmacy as soon as possible. It has been approved and she would like to start medication

## 2023-11-29 NOTE — Progress Notes (Signed)
 [facility] PRE[facility] Weekly Session  Patient Details  Name: Mary Krueger MRN: [id number] Date of Birth: [birth year] Age: 78 y.o. PCP: Dorothyann Peng, MD  There were no vitals filed for this visit.   [facility] Weekly seesion - 11/29/23 1300       [facility] "PREP" Location   [facility] "PREP" Location [facility]      Weekly Session   Topic Discussed Goal setting and welcome to the program   Introductions, review of notebook, tour of facility; offered option for short warm up on cardio machine   Classes attended to date 1             Coron Rossano B Tiffanee Mcnee 11/29/2023, 1:31 PM

## 2023-12-06 NOTE — Progress Notes (Signed)
 [facility] PREP Weekly Session  Patient Details  Name: Mary Krueger MRN: [id number] Date of Birth: April 24, 1946 Age: 78 y.o. PCP: Dorothyann Peng, MD  There were no vitals filed for this visit.   [facility] Weekly seesion - 12/06/23 1200       [facility] "PREP" Location   [facility] "PREP" Location [facility]      Weekly Session   Topic Discussed Importance of resistance training;Other ways to be active   Goal by the end of the program: 150 min/wk of cardio; strength training 2-3 times/wk for 20-40 minutes   Classes attended to date 3             Ashtian Villacis B Asmara Backs 12/06/2023, 12:25 PM

## 2023-12-12 ENCOUNTER — Other Ambulatory Visit: Payer: Self-pay | Admitting: Internal Medicine

## 2023-12-12 NOTE — Telephone Encounter (Signed)
 Copied from CRM [id number]. Topic: Clinical - Medication Refill >> Dec 12, 2023 10:22 AM Nada Libman H wrote: Most Recent Primary Care Visit:  Provider: Dorothyann Peng  Department: [facility]  Visit Type: PHYSICAL  Date: 11/08/2023  Medication: PARoxetine (PAXIL) 30 MG tablet [[id number]]  Has the patient contacted their pharmacy? No (Agent: If no, request that the patient contact the pharmacy for the refill. If patient does not wish to contact the pharmacy document the reason why and proceed with request.) (Agent: If yes, when and what did the pharmacy advise?)  Is this the correct pharmacy for this prescription? Yes If no, delete pharmacy and type the correct one.  This is the patient's preferred pharmacy:  [facility] [id number] - [place], [place] - [address] [place] [address] Phone: [phone] Fax: [phone]   Has the prescription been filled recently? No  Is the patient out of the medication? Yes  Has the patient been seen for an appointment in the last year OR does the patient have an upcoming appointment? No  Can we respond through MyChart? Yes  Agent: Please be advised that Rx refills may take up to 3 business days. We ask that you follow-up with your pharmacy.

## 2023-12-13 NOTE — Progress Notes (Signed)
 [facility] PREP Weekly Session  Patient Details  Name: Mary Krueger MRN: [id number] Date of Birth: [birth year] Age: 78 y.o. PCP: Dorothyann Peng, MD  Vitals:   12/13/23 1138  Weight: 148 lb (67.1 kg)     [facility] Weekly seesion - 12/13/23 1100       [facility] "PREP" Location   [facility] "PREP" Location [facility]      Weekly Session   Topic Discussed Healthy eating tips   Foods to decrease, foods to increase; eat the rainbow of colors; introduced to [employer]   Minutes exercised this week 300 minutes    Classes attended to date 5             Ples Trudel B Jozelyn Kuwahara 12/13/2023, 11:39 AM

## 2023-12-20 ENCOUNTER — Encounter: Payer: Self-pay | Admitting: Dermatology

## 2023-12-20 ENCOUNTER — Ambulatory Visit (INDEPENDENT_AMBULATORY_CARE_PROVIDER_SITE_OTHER): Payer: Medicare HMO | Admitting: Dermatology

## 2023-12-20 VITALS — BP 105/65 | HR 78

## 2023-12-20 DIAGNOSIS — L820 Inflamed seborrheic keratosis: Secondary | ICD-10-CM | POA: Diagnosis not present

## 2023-12-20 DIAGNOSIS — L821 Other seborrheic keratosis: Secondary | ICD-10-CM | POA: Diagnosis not present

## 2023-12-20 DIAGNOSIS — L304 Erythema intertrigo: Secondary | ICD-10-CM | POA: Diagnosis not present

## 2023-12-20 DIAGNOSIS — L409 Psoriasis, unspecified: Secondary | ICD-10-CM | POA: Diagnosis not present

## 2023-12-20 MED ORDER — NYSTATIN-TRIAMCINOLONE 100000-0.1 UNIT/GM-% EX OINT: 1.0000 | TOPICAL_OINTMENT | Freq: Two times a day (BID) | CUTANEOUS | 0 refills | Status: DC

## 2023-12-20 NOTE — Patient Instructions (Signed)

## 2023-12-20 NOTE — Progress Notes (Signed)
 [facility] PREP Weekly Session  Patient Details  Name: Mary Krueger MRN: [id number] Date of Birth: Sep 14, 1946 Age: 78 y.o. PCP: Dorothyann Peng, MD  Vitals:   12/20/23 1138  Weight: 146 lb 3.2 oz (66.3 kg)     [facility] Weekly seesion - 12/20/23 1100       [facility] "PREP" Location   [facility] "PREP" Location [facility]      Weekly Session   Topic Discussed Health habits   Sugar Demo   Minutes exercised this week 150 minutes    Classes attended to date 7             Mary Krueger 12/20/2023, 11:39 AM

## 2023-12-20 NOTE — Progress Notes (Unsigned)
 Follow-Up Visit   Subjective  Mary Krueger is a 78 y.o. female who presents for the following: Psoriasis  Patient present today for follow up visit for psoriasis. Patient was last evaluated on 09/21/23. At this visit patient was prescribed tacrolimus 0.1% cream to use on the area when taking a break from clobetasol. Patient reports sxs are  improved . Patient denies medication changes. Patient reports she has a new irritation located underneath her breast that appeared 3 days ago. She reports the areas are not bothersome but are red an flaky. She has not tried any topicals on the areas.  The following portions of the chart were reviewed this encounter and updated as appropriate: medications, allergies, medical history  Review of Systems:  No other skin or systemic complaints except as noted in HPI or Assessment and Plan.  Objective  Well appearing patient in no apparent distress; mood and affect are within normal limits.  A full examination was performed including scalp, head, eyes, ears, nose, lips, neck, chest, axillae, abdomen, back, buttocks, bilateral upper extremities, bilateral lower extremities, hands, feet, fingers, toes, fingernails, and toenails. All findings within normal limits unless otherwise noted below.   Relevant exam findings are noted in the Assessment and Plan.  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 Following review of the hematoxylin and eosin sections, a PAS stain was obtained to exclude a fungal infection. The PAS stain is negative for fungal organisms. 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   Assessment & Plan   PSORIASIS Exam: Resolved Well-demarcated  erythematous papules/plaques with silvery scale, guttate pink scaly papules.  Well Controlled  Patient denies joint pain  Psoriasis is a chronic non-curable, but treatable genetic/hereditary disease that may have other systemic features affecting other organ systems such as joints (Psoriatic Arthritis). It is associated with an increased risk of inflammatory bowel disease, heart disease, non-alcoholic fatty liver disease, and depression.  Treatments include light and laser treatments; topical medications; and systemic medications including oral and injectables.  - Assessment: Patient's psoriasis has largely cleared up, with arms now clear and only minimal involvement on the legs. Previous treatment with clobetasol and tacrolimus was effective. Patient discontinued medication use after improvement due to cost concerns. - Plan:    Pathology report reviewed with patient in detail.  - Use clobetasol twice daily for 2 weeks on remaining affected areas on legs    If psoriasis recurs, use clobetasol first, then alternate with tacrolimus until clear    Consider sun exposure as a beneficial adjunct therapy    INTERTRIGO Exam: Erythematous macerated patches in body folds  Flared  Intertrigo is a chronic recurrent rash that occurs in skin fold areas that may be associated with friction; heat; moisture; yeast; fungus; and bacteria.  It is exacerbated by increased movement / activity; sweating; and higher atmospheric temperature.  Use of an absorbant powder such as Zeasorb AF powder or other OTC antifungal powder to the area daily can prevent rash recurrence. Other options to help keep the area dry include blow drying the area after bathing or using antiperspirant products such as Duradry sweat minimizing gel.  Treatment Plan: - We will prescribe Nystatin- TMC to apply 2 times daily until areas clear  SEBORRHEIC KERATOSIS - Stuck-on, waxy, tan-brown papules and/or plaques  - Benign-appearing -  Discussed benign etiology and prognosis. - Observe - Call for any changes  INFLAMED SEBORRHEIC KERATOSIS Neck - Anterior Destruction of lesion - Neck - Anterior Complexity: simple   Destruction method: cryotherapy   Informed consent: discussed and consent obtained   Timeout:  patient name, date of birth, surgical site, and procedure verified Lesion destroyed using liquid nitrogen: Yes   Cryotherapy cycles:  1 Post-procedure details: wound care instructions given   INTERTRIGO   Related Medications nystatin-triamcinolone ointment (MYCOLOG) Apply 1 Application topically 2 (two) times daily. PSORIASIS   Related Medications tacrolimus (PROTOPIC) 0.1 % ointment Apply topically daily. Use on affected areas for 2 weeks while on break from using clobetasol.  Return in about 6 months (around 06/21/2024) for Psoriasis F/U.  Documentation: I have reviewed the above documentation for accuracy and completeness, and I agree with the above.  Stasia Cavalier, am acting as scribe for Langston Reusing, DO.  Langston Reusing, DO

## 2023-12-21 ENCOUNTER — Ambulatory Visit

## 2023-12-21 ENCOUNTER — Ambulatory Visit: Payer: Self-pay | Admitting: Internal Medicine

## 2023-12-21 ENCOUNTER — Encounter: Payer: Self-pay | Admitting: Physician Assistant

## 2023-12-21 ENCOUNTER — Ambulatory Visit (INDEPENDENT_AMBULATORY_CARE_PROVIDER_SITE_OTHER): Admitting: Physician Assistant

## 2023-12-21 VITALS — BP 146/70 | HR 73 | Temp 98.0°F | Ht 64.0 in | Wt 146.4 lb

## 2023-12-21 DIAGNOSIS — M5441 Lumbago with sciatica, right side: Secondary | ICD-10-CM

## 2023-12-21 DIAGNOSIS — G8929 Other chronic pain: Secondary | ICD-10-CM

## 2023-12-21 DIAGNOSIS — I119 Hypertensive heart disease without heart failure: Secondary | ICD-10-CM | POA: Diagnosis not present

## 2023-12-21 DIAGNOSIS — M419 Scoliosis, unspecified: Secondary | ICD-10-CM | POA: Diagnosis not present

## 2023-12-21 DIAGNOSIS — M47816 Spondylosis without myelopathy or radiculopathy, lumbar region: Secondary | ICD-10-CM | POA: Diagnosis not present

## 2023-12-21 DIAGNOSIS — M4316 Spondylolisthesis, lumbar region: Secondary | ICD-10-CM | POA: Diagnosis not present

## 2023-12-21 DIAGNOSIS — M545 Low back pain, unspecified: Secondary | ICD-10-CM | POA: Diagnosis not present

## 2023-12-21 MED ORDER — MELOXICAM 7.5 MG PO TABS: 7.5000 mg | ORAL_TABLET | Freq: Every day | ORAL | 0 refills | Status: DC

## 2023-12-21 NOTE — Patient Instructions (Signed)
 It was great to see you!  Start meloxicam 7.5 mg daily x 1 week, then as needed after that  Xray today  Consider OTC (available over the counter without a prescription) lidocaine patches  Continue gentle stretches  A referral has been placed for you to see one of our fantastic providers at [facility]. Someone from their office will be in touch soon regarding scheduling your appointment.  Their location: [facility]  [address] on the 1st floor Phone number [phone] Fax [phone].   This location is across the street from the entrance to [employer] and in the same complex as the [facility]    Take care,  Jarold Motto PA-C

## 2023-12-21 NOTE — Telephone Encounter (Signed)
  Chief Complaint: lower back pain Symptoms: moderate lower back pain radiates to both legs, right leg numbness and tingling, weakness worse after walking, rash, night sweats Frequency: x few months Pertinent Negatives: Patient denies loss of bladder or bowel control. Disposition: [] ED /[] Urgent Care (no appt availability in office) / [x] Appointment(In office/virtual)/ []  [place] [facility]/ [] Home Care/ [] Refused Recommended Disposition /[] [facility]/ []  Follow-up with PCP Additional Notes: Patient states she has been having back pain x few months and was seen at office visit and given cortisone shot which she states did not help at all. Patient seen yesterday by dermatologist for a rash. She states she thinks the night sweats and rash are related to her back. She states she has not taken anything for pain. She started a new exercise program at the [facility] a week ago. Patient agreeable to acute visit today, she states she can not make appts at PCP office due to she needs an afternoon appt. Scheduled with [facility].  Copied from CRM [id number]. Topic: Clinical - Red Word Triage >> Dec 21, 2023 10:32 AM [name] wrote: Red Word that prompted transfer to Nurse Triage: The patient is having back pain, leg weakness, rash, and night sweats. She has had a cortisone shot for her back previously but it has not helped. Reason for Disposition  [1] Pain radiates into the thigh or further down the leg AND [2] both legs  Answer Assessment - Initial Assessment Questions 1. ONSET: "When did the pain begin?"      Few months ago, states nothing worse or better since last office visit.  2. LOCATION: "Where does it hurt?" (upper, mid or lower back)     Lower back.  3. SEVERITY: "How bad is the pain?"  (e.g., Scale 1-10; mild, moderate, or severe)   - MILD (1-3): Doesn't interfere with normal activities.    - MODERATE (4-7): Interferes with normal activities or awakens from sleep.    -  SEVERE (8-10): Excruciating pain, unable to do any normal activities.      4-5/10,denies any pain treatment.  4. PATTERN: "Is the pain constant?" (e.g., yes, no; constant, intermittent)      Sitting or lying down it is fine, comes and goes. Pain is present and at its worst when walking.  5. RADIATION: "Does the pain shoot into your legs or somewhere else?"     Radiates to both hips, and down legs. Worse on right side.  6. CAUSE:  "What do you think is causing the back pain?"      Unsure, she states she has not had an Xray or imaging.  7. BACK OVERUSE:  "Any recent lifting of heavy objects, strenuous work or exercise?"     Denies.  8. MEDICINES: "What have you taken so far for the pain?" (e.g., nothing, acetaminophen, NSAIDS)     Denies taking any medication for pain.  9. NEUROLOGIC SYMPTOMS: "Do you have any weakness, numbness, or problems with bowel/bladder control?"     Weakness, numbness and tingling to right leg.  10. OTHER SYMPTOMS: "Do you have any other symptoms?" (e.g., fever, abdomen pain, burning with urination, blood in urine)       Night sweats, rash "it's coming back again" and was seen and treated by dermatologist yesterday.  11. PREGNANCY: "Is there any chance you are pregnant?" "When was your last menstrual period?"       N/A.  Protocols used: Back Pain-A-AH

## 2023-12-21 NOTE — Progress Notes (Signed)
 Mary Krueger is a 78 y.o. female here for a new problem.  History of Present Illness:   Chief Complaint  Patient presents with   Back Pain    Pt c/o low back pain radiates down right leg, she says it feels like her leg is going to give out on her and numbness and tingling.    Back Pain  Patient complains of low back pain that has persisted for the past few months. Pain tends to radiate down her right leg and tends to worsen after walking for sometime. She was seen by her PCP Dr. Allyne Gee in February, since that visit, she has been experiencing increased numbness.    Associated symptoms include tingling along with occasional leg weakness.  She was previously given a cortisone shot back in February without much relief. Did have a shot about 7 years ago which did provide relief at that time.   She was also prescribed baclofen but states she wasn't able to tolerate it as it caused to sleepiness/fatigued for several days.   Reports taking meloxicam as needed, tries to limit intake as if can effect her kidney function.   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   Denies an urinary or GI symptoms, numbness in her inner thighs.   HTN Currently taking metoprolol 25 mg xl. At home blood pressure readings are: not checked. Patient denies chest pain, SOB, blurred vision, dizziness, unusual headaches, lower leg swelling. Patient is compliant with medication. Denies excessive caffeine intake, stimulant usage, excessive alcohol intake, or increase in salt consumption.  BP Readings from Last 3 Encounters:  12/21/23 (!) 146/70  12/20/23 105/65  11/15/23 (!) 140/80      Past Medical History:  Diagnosis Date   Allergy    Annual physical exam 11/08/2023   Anxiety    Chronic   Arthritis    generalized   Breast mass, left 2005   fatty on U/S   Cataract    bilateral sx   Fibroid 1987   TAH/USO   GERD  (gastroesophageal reflux disease)    Glaucoma    on meds   Hyperlipidemia    diet controlled   Uses hearing aid    Bilateral    Vitamin D deficiency      Social History   Tobacco Use   Smoking status: Former    Current packs/day: 0.00    Average packs/day: 1.5 packs/day for 18.0 years (27.0 ttl pk-yrs)    Types: Cigarettes    Start date: 1963    Quit date: 10/05/1979    Years since quitting: 44.2    Passive exposure: Never   Smokeless tobacco: Never   Tobacco comments:    quit in 1981  Vaping Use   Vaping status: Never Used  Substance Use Topics   Alcohol use: No   Drug use: No    Past Surgical History:  Procedure Laterality Date   ABDOMINAL HYSTERECTOMY  1989   BALLOON DILATION N/A 03/05/2019   Procedure: BALLOON DILATION;  Surgeon: Napoleon Form, MD;  Location: [facility] ENDOSCOPY;  Service: Endoscopy;  Laterality: N/A;  TTS balloon dilation   CATARACT EXTRACTION W/ INTRAOCULAR LENS IMPLANT Bilateral 12/2015 01/2016   COSMETIC SURGERY     Mini face lift   ESOPHAGEAL MANOMETRY N/A 03/05/2019   Procedure: ESOPHAGEAL MANOMETRY (EM);  Surgeon: Napoleon Form, MD;  Location: [facility];  Service: Endoscopy;  Laterality: N/A;  ESOPHAGOGASTRODUODENOSCOPY  01/2019   ESOPHAGOGASTRODUODENOSCOPY (EGD) WITH PROPOFOL N/A 01/17/2019   Procedure: ESOPHAGOGASTRODUODENOSCOPY (EGD) WITH PROPOFOL;  Surgeon: Beverley Fiedler, MD;  Location: [facility] ENDOSCOPY;  Service: Gastroenterology;  Laterality: N/A;   ESOPHAGOGASTRODUODENOSCOPY (EGD) WITH PROPOFOL N/A 03/05/2019   Procedure: ESOPHAGOGASTRODUODENOSCOPY (EGD) WITH PROPOFOL;  Surgeon: Napoleon Form, MD;  Location: [facility] END[facility];  Service: Endoscopy;  Laterality: N/A;   EYE SURGERY  06/2012   eye lift   FOREIGN BODY REMOVAL  01/17/2019   Procedure: FOREIGN BODY REMOVAL;  Surgeon: Beverley Fiedler, MD;  Location: [facility] ENDOSCOPY;  Service: Gastroenterology;;   LIPOMA EXCISION Left 2009   left arm   STOMACH SURGERY  1997   Sphincter    TONSILLECTOMY AND ADENOIDECTOMY  1972   TOTAL ABDOMINAL HYSTERECTOMY W/ BILATERAL SALPINGOOPHORECTOMY  1989   TRIGGER FINGER RELEASE     right    Family History  Problem Relation Age of Onset   Diabetes Mother    Anxiety disorder Mother    Depression Mother    Hearing loss Mother    Colon polyps Father 56   Prostate cancer Father    Colon cancer Father    Alcohol abuse Father    Cancer Father    Diabetes Brother    Prostate cancer Brother    Cancer Brother    COPD Sister    Esophageal cancer Neg Hx    Stomach cancer Neg Hx    Rectal cancer Neg Hx     Allergies  Allergen Reactions   Paxlovid [Nirmatrelvir-Ritonavir] Diarrhea   Simvastatin Other (See Comments)    myalgias   Atorvastatin Other (See Comments)    myalgias   Rosuvastatin Other (See Comments)    myalgias    Current Medications:   Current Outpatient Medications:    Evolocumab (REPATHA SURECLICK) 140 MG/ML SOAJ, Inject 140 mg into the skin every 14 (fourteen) days., Disp: 6 mL, Rfl: 3   latanoprost (XALATAN) 0.005 % ophthalmic solution, Place 1 drop into both eyes at bedtime., Disp: , Rfl:    meloxicam (MOBIC) 7.5 MG tablet, Take 1 tablet (7.5 mg total) by mouth daily., Disp: 30 tablet, Rfl: 0   metoprolol succinate (TOPROL-XL) 25 MG 24 hr tablet, TAKE ONE TABLET BY MOUTH EVERY EVENING, Disp: 90 tablet, Rfl: 0   PARoxetine (PAXIL) 30 MG tablet, Take 30 mg by mouth daily., Disp: , Rfl:    VITAMIN D PO, Take 1 tablet by mouth daily at 6 (six) AM., Disp: , Rfl:    Review of Systems:   Review of Systems  Gastrointestinal: Negative.   Genitourinary: Negative.   Musculoskeletal:  Positive for back pain.  Neurological:  Positive for tingling and weakness.       +numbness   Negative unless otherwise specified per HPI.   Vitals:   Vitals:   12/21/23 1449 12/21/23 1510  BP: (!) 150/76 (!) 146/70  Pulse: 73   Temp: 98 F (36.7 C)   TempSrc: Temporal   SpO2: 98%   Weight: 146 lb 6.1 oz (66.4 kg)    Height: 5\' 4"  (1.626 m)      Body mass index is 25.13 kg/m.  Physical Exam:   Physical Exam Constitutional:      Appearance: Normal appearance. She is well-developed.  HENT:     Head: Normocephalic and atraumatic.  Eyes:     General: Lids are normal.     Extraocular Movements: Extraocular movements intact.     Conjunctiva/sclera: Conjunctivae normal.  Pulmonary:  Effort: Pulmonary effort is normal.  Musculoskeletal:        General: Normal range of motion.     Cervical back: Normal range of motion and neck supple.     Comments: No decreased ROM 2/2 pain with flexion/extension, lateral side bends, or rotation. Reproducible tenderness with deep palpation to bilateral paraspinal muscles. No bony tenderness. No evidence of erythema, rash or ecchymosis.    Skin:    General: Skin is warm and dry.  Neurological:     Mental Status: She is alert and oriented to person, place, and time.     Deep Tendon Reflexes:     Reflex Scores:      Patellar reflexes are 2+ on the right side and 2+ on the left side.    Comments: Normal gait Normal perceived sensation to b/l lower extremities  Psychiatric:        Attention and Perception: Attention and perception normal.        Mood and Affect: Mood normal.        Behavior: Behavior normal.        Thought Content: Thought content normal.        Judgment: Judgment normal.     Assessment and Plan:   Chronic bilateral low back pain with right-sided sciatica No red flags Will order dedicated imaging - xray ordered Start meloxicam 7.5 mg daily x 1 week, then as needed after that Consider OTC (available over the counter without a prescription) lidocaine patches Continue gentle stretches I have also placed sports medicine referral since she has had an increase in symptom(s) Recommend ER evaluate if any new/worsening symptom(s)   Hypertensive heart disease without heart failure Above goal today No evidence of end-organ damage on my  exam Recommend patient monitor home blood pressure at least a few times weekly Continue 25 mg toprol xl daily If home monitoring shows consistent elevation, or any symptom(s) develop, recommend reach out to Primary Care Provider (PCP) for further advice on next steps   Jarold Motto, PA-C  I,Safa M Kadhim,acting as a scribe for [employer], PA.,have documented all relevant documentation on the behalf of Jarold Motto, PA,as directed by  Jarold Motto, PA while in the presence of Jarold Motto, [place].  I, Jarold Motto, [place], have reviewed all documentation for this visit. The documentation on 12/21/23 for the exam, diagnosis, procedures, and orders are all accurate and complete.

## 2023-12-27 ENCOUNTER — Encounter: Payer: Self-pay | Admitting: Family Medicine

## 2023-12-27 ENCOUNTER — Ambulatory Visit: Admitting: Family Medicine

## 2023-12-27 VITALS — BP 134/80 | HR 97 | Ht 64.0 in | Wt 147.0 lb

## 2023-12-27 DIAGNOSIS — G8929 Other chronic pain: Secondary | ICD-10-CM

## 2023-12-27 DIAGNOSIS — M5441 Lumbago with sciatica, right side: Secondary | ICD-10-CM | POA: Diagnosis not present

## 2023-12-27 DIAGNOSIS — M5442 Lumbago with sciatica, left side: Secondary | ICD-10-CM | POA: Diagnosis not present

## 2023-12-27 MED ORDER — PREDNISONE 50 MG PO TABS: ORAL_TABLET | ORAL | 0 refills | Status: DC

## 2023-12-27 NOTE — Patient Instructions (Addendum)
 Thank you for coming in today.   I've sent a prescription for Prednisone to your pharmacy.   I've referred you to Physical Therapy.  Let us know if you don't hear from them in one week.   Check back in 6 weeks

## 2023-12-27 NOTE — Progress Notes (Signed)
 [facility] PRE[facility] Weekly Session  Patient Details  Name: Mary Krueger MRN: [id number] Date of Birth: 06/18/1946 Age: 78 y.o. PCP: Dorothyann Peng, MD  Vitals:   12/27/23 1141  Weight: 147 lb (66.7 kg)     [facility] Weekly seesion - 12/27/23 1100       [facility] "PREP" Location   [facility] "PREP" Location [facility]      Weekly Session   Topic Discussed Restaurant Eating   Salt demo; review of low sodium flavoring tips   Classes attended to date 63             Normajean Nash B Serai Tukes 12/27/2023, 11:45 AM

## 2023-12-27 NOTE — Progress Notes (Signed)
   I, Stevenson Clinch, CMA acting as a scribe for Clementeen Graham, MD.  Mary Krueger is a 78 y.o. female who presents to [facility] today for LBP x a few months. Pt locates pain to bilat lowe back radiating into bilat legs and left groin. Groin pain with certain movements. Some improvement of sx with Meloxicam.   Radiating pain: B LE LE numbness/tingling: R LE LE weakness: R LE Aggravates: ambulation Treatments tried: meloxicam  Dx imaging: 12/21/23 L-spine XR  Pertinent review of systems: No fevers or chills  Relevant historical information: Peripheral arterial disease   Exam:  BP 134/80   Pulse 97   Ht 5\' 4"  (1.626 m)   Wt 147 lb (66.7 kg)   LMP 10/04/1984   SpO2 97%   BMI 25.23 kg/m  General: Well Developed, well nourished, and in no acute distress.   MSK: L-spine: Normal appearing Nontender palpation spinal midline. Normal lumbar motion. Lower extremity strength is intact. Reflexes are intact. Negative slump test. Pain is reproduced with back extension.   Lab and Radiology Results  X-ray images lumbar spine obtained December 21, 2023 personally and independently interpreted. No level DDD.  No acute lumbar fractures.  Possible vertebral compression fracture at T11.  This may be present on prior CT scans. Await formal radiology review   Assessment and Plan: 78 y.o. female with chronic low back pain with radicular symptoms both lower extremities in an L5 or S1 dermatomal pattern.  She does experience some right leg and foot numbness at times but no significant weakness.  Plan for short course of prednisone and trial of physical therapy.  We talked about gabapentin and she would like to avoid that.  If not improving consider MRI.  Recheck in 6 weeks.   PDMP not reviewed this encounter. Orders Placed This Encounter  Procedures   Ambulatory referral to Physical Therapy    Referral Priority:   Routine    Referral Type:   Physical  Medicine    Referral Reason:   Specialty Services Required    Requested Specialty:   Physical Therapy    Number of Visits Requested:   1   Meds ordered this encounter  Medications   predniSONE (DELTASONE) 50 MG tablet    Sig: Take 1 pill daily for 5 days    Dispense:  5 tablet    Refill:  0     Discussed warning signs or symptoms. Please see discharge instructions. Patient expresses understanding.   The above documentation has been reviewed and is accurate and complete Clementeen Graham, M.D.

## 2024-01-03 NOTE — Progress Notes (Signed)
 [facility] PRE[facility] Weekly Session  Patient Details  Name: Mary Krueger MRN: [id number] Date of Birth: 1946/07/20 Age: 78 y.o. PCP: Dorothyann Peng, MD  Vitals:   01/03/24 1114  Weight: 148 lb (67.1 kg)     [facility] Weekly seesion - 01/03/24 1100       [facility] "PREP" Location   [facility] "PREP[facility] Location [facility]      Weekly Session   Topic Discussed Stress management and problem solving   importance of 7-9 hrs/night of sleep; finger tip mudra breathwork; sleep guidelines   Minutes exercised this week 90 minutes    Classes attended to date 10             Mary Krueger 01/03/2024, 11:15 AM

## 2024-01-10 NOTE — Progress Notes (Signed)
 [facility] PRE[facility] Weekly Session  Patient Details  Name: Mary Krueger MRN: [id number] Date of Birth: [birth year] Age: 78 y.o. PCP: Dorothyann Peng, MD  Vitals:   01/10/24 1123  Weight: 145 lb 9.6 oz (66 kg)     [facility] Weekly seesion - 01/10/24 1100       [facility] "PREP" Location   [facility] "PREP[facility] Location [facility]      Weekly Session   Topic Discussed Expectations and non-scale victories   Halfway thru program, review/revisit/restate goals; staying positive   Classes attended to date 79             Neoma Uhrich B Remedios Mckone 01/10/2024, 11:24 AM

## 2024-01-17 NOTE — Progress Notes (Signed)
 [facility] PRE[facility] Weekly Session  Patient Details  Name: Mary Krueger MRN: [id number] Date of Birth: 10/31/1945 Age: 78 y.o. PCP: Cleave Curling, MD  Vitals:   01/17/24 1122  Weight: 147 lb 8 oz (66.9 kg)     [facility] Weekly seesion - 01/17/24 1100       [facility] "PREP" Location   [facility] "PREP[facility] Location [facility]      Weekly Session   Topic Discussed Other   Portion control; visualize your portion size demo; review of Red. sugar craisins food label.   Classes attended to date 12             Michaeleen Down B Miki Blank 01/17/2024, 11:22 AM

## 2024-01-24 NOTE — Progress Notes (Signed)
 [facility] PRE[facility] Weekly Session  Patient Details  Name: Mary Krueger MRN: [id number] Date of Birth: 1946/09/20 Age: 78 y.o. PCP: Cleave Curling, MD  Vitals:   01/24/24 1116  Weight: 147 lb 8 oz (66.9 kg)     [facility] Weekly seesion - 01/24/24 1100       [facility] "PREP" Location   [facility] "PREP[facility] Location [facility]      Weekly Session   Topic Discussed Eating for the season;Finding support   Managing Holiday eating---wt maintenance vs. wt loss   Minutes exercised this week 60 minutes    Classes attended to date 20             Juanetta Negash B Berta Denson 01/24/2024, 11:16 AM

## 2024-01-31 ENCOUNTER — Other Ambulatory Visit: Payer: Self-pay | Admitting: Physician Assistant

## 2024-01-31 NOTE — Progress Notes (Signed)
 [facility] PRE[facility] Weekly Session  Patient Details  Name: Mary Krueger MRN: [id number] Date of Birth: Sep 27, 1946 Age: 78 y.o. PCP: Cleave Curling, MD  Vitals:   01/31/24 1153  Weight: 148 lb 9.6 oz (67.4 kg)     [facility] Weekly seesion - 01/31/24 1100       [facility] "PREP" Location   [facility] "PREP[facility] Location [facility]      Weekly Session   Topic Discussed Calorie breakdown   Carbs, fats and proteins; simple vs. complex carbs; nutritional supplement trustworthy organizations; membership talk w/Nick   Minutes exercised this week 60 minutes    Classes attended to date 46             Jarelle Ates B Carli Lefevers 01/31/2024, 11:54 AM

## 2024-02-01 ENCOUNTER — Other Ambulatory Visit: Payer: Self-pay | Admitting: Physician Assistant

## 2024-02-01 NOTE — Therapy (Signed)
 OUTPATIENT PHYSICAL THERAPY THORACOLUMBAR EVALUATION   Patient Name: Mary Krueger MRN: [id number] DOB:[birth year], 78 y.o., female Today's Date: 02/02/2024  END OF SESSION:  PT End of Session - 02/02/24 1351     Visit Number 1    Number of Visits 16    Date for PT Re-Evaluation 04/26/24    Authorization Type aetna medicare $10 copay    PT Start Time 1353    PT Stop Time 1430    PT Time Calculation (min) 37 min    Activity Tolerance Patient tolerated treatment well    Behavior During Therapy [facility] for tasks assessed/performed             Past Medical History:  Diagnosis Date   Allergy    Annual physical exam 11/08/2023   Anxiety    Chronic   Arthritis    generalized   Breast mass, left 2005   fatty on U/S   Cataract    bilateral sx   Fibroid 1987   TAH/USO   GERD (gastroesophageal reflux disease)    Glaucoma    on meds   Hyperlipidemia    diet controlled   Uses hearing aid    Bilateral    Vitamin D  deficiency    Past Surgical History:  Procedure Laterality Date   ABDOMINAL HYSTERECTOMY  1989   BALLOON DILATION N/A 03/05/2019   Procedure: BALLOON DILATION;  Surgeon: Sergio Dandy, MD;  Location: [facility] ENDOSCOPY;  Service: Endoscopy;  Laterality: N/A;  TTS balloon dilation   CATARACT EXTRACTION W/ INTRAOCULAR LENS IMPLANT Bilateral 12/2015 01/2016   COSMETIC SURGERY     Mini face lift   ESOPHAGEAL MANOMETRY N/A 03/05/2019   Procedure: ESOPHAGEAL MANOMETRY (EM);  Surgeon: Sergio Dandy, MD;  Location: [facility];  Service: Endoscopy;  Laterality: N/A;   ESOPHAGOGASTRODUODENOSCOPY  01/2019   ESOPHAGOGASTRODUODENOSCOPY (EGD) WITH PROPOFOL  N/A 01/17/2019   Procedure: ESOPHAGOGASTRODUODENOSCOPY (EGD) WITH PROPOFOL ;  Surgeon: Nannette Babe, MD;  Location: [facility] ENDOSCOPY;  Service: Gastroenterology;  Laterality: N/A;   ESOPHAGOGASTRODUODENOSCOPY (EGD) WITH PROPOFOL  N/A 03/05/2019   Procedure: ESOPHAGOGASTRODUODENOSCOPY (EGD) WITH PROPOFOL ;  Surgeon:  Sergio Dandy, MD;  Location: [facility];  Service: Endoscopy;  Laterality: N/A;   EYE SURGERY  06/2012   eye lift   FOREIGN BODY REMOVAL  01/17/2019   Procedure: FOREIGN BODY REMOVAL;  Surgeon: Nannette Babe, MD;  Location: [facility] ENDOSCOPY;  Service: Gastroenterology;;   LIPOMA EXCISION Left 2009   left arm   STOMACH SURGERY  1997   Sphincter   TONSILLECTOMY AND ADENOIDECTOMY  1972   TOTAL ABDOMINAL HYSTERECTOMY W/ BILATERAL SALPINGOOPHORECTOMY  1989   TRIGGER FINGER RELEASE     right   Patient Active Problem List   Diagnosis Date Noted   Annual physical exam 11/08/2023   Psoriasis 11/08/2023   Right sciatic nerve pain 11/08/2023   Hypertensive heart disease without heart failure 07/14/2023   Statin myopathy 07/14/2023   Adrenal mass (HCC) 07/14/2023   Cough 03/28/2022   Generalized weakness 03/27/2022   COVID-19 virus infection 03/27/2022   Peripheral arterial disease (HCC) 02/09/2022   Vitamin D  deficiency 08/12/2021   Hordeolum externum of left upper eyelid 08/12/2021   Estrogen deficiency 08/12/2021   Achalasia of esophagus    Esophageal dysphagia    Pure hypercholesterolemia 12/14/2018   Tachycardia 06/17/2018   Abnormal glucose 06/17/2018   Primary snoring 07/12/2016   SHOULDER PAIN, RIGHT, CHRONIC 12/17/2008   Mixed hyperlipidemia 10/24/2008   Generalized anxiety disorder 10/24/2008  PCP: Cleave Curling, MD  REFERRING PROVIDER: Syliva Even, MD  REFERRING DIAG: [phone] (ICD-10-CM) - Chronic bilateral low back pain without sciatica  Rationale for Evaluation and Treatment: Rehabilitation  THERAPY DIAG:  Other low back pain  Muscle weakness (generalized)  ONSET DATE: Around thanksgiving 2024  SUBJECTIVE:                                                                                                                                                                                           SUBJECTIVE STATEMENT: States she has pain going down  both legs and her right leg starts to tingle and it feels like she loses control over it. Walking makes it worse.  Sitting makes it better reports she does not usually take medication but she did take Tylenol  today and it seems to be helping.  Reports flexion also makes it better.  She also has difficulties rolling over in bed due to pain in the left hip.  She has been participating in a [employer] program and that is also painful at times in her back and legs   PERTINENT HISTORY:  hysterectomy  PAIN:  Are you having pain? Yes: NPRS scale: 2/10 at rest, 5/10  Pain location: back and gong down both legs R>L  Pain description: numb tingling ache Aggravating factors: working out, walking Relieving factors: sitting, tylenol    PRECAUTIONS: None  RED FLAGS: None   WEIGHT BEARING RESTRICTIONS: No  FALLS:  Has patient fallen in last 6 months? No   OCCUPATION: not currently working, walking and working out  PLOF: Independent  PATIENT GOALS: pain relief      OBJECTIVE:  Note: Objective measures were completed at Evaluation unless otherwise noted.  DIAGNOSTIC FINDINGS:  Lumbar xray 12/1923 IMPRESSION: 1. No fracture or acute finding. 2. Scoliosis and degenerative changes as detailed. Degenerative changes at L4-L5 have progressed since the prior radiographs.  PATIENT SURVEYS:  Patient-specific activity functional scoring scheme (Point to one number):  "0" represents "unable to perform." "10" represents "able to perform at prior level. 0 1 2 3 4 5 6 7 8 9  10 (Date and Score) Activity Initial  Activity Eval     Walking   4    Working out   5    Stairs  5    Additional Additional Total score = sum of the activity scores/number of activities Minimum detectable change (90%CI) for average score = 2 points Minimum detectable change (90%CI) for single activity score = 3 points PSFS developed by: Melbourne Spitz., & Binkley, J. (1995). Assessing disability  and change on individual  patients: a  report of a patient specific measure. Physiotherapy [place], 47, [phone]. Reproduced with the permission of the authors  Score: 14/3=4.66   COGNITION: Overall cognitive status: Within functional limits for tasks assessed     SENSATION: Not tested    POSTURE: rounded shoulders, forward head, posterior pelvic tilt, and flexed trunk   PALPATION: Tenderness to palpation along right glutes causing numbness into right lower extremity no tenderness to palpation noted in lumbar spine  LUMBAR ROM:   AROM eval  Flexion WFL (feels good)  Extension 50% limited  Right lateral flexion 50% limited *  Left lateral flexion 50% limited *  Right rotation   Left rotation    (Blank rows = not tested) *Pain   LE Measurements Lower Extremity Right EVAL Left EVAL   A/PROM MMT A/PROM MMT  Hip Flexion WFL 4 WFL* 4  Hip Extension  3+*  3+  Hip Abduction   *   Hip Adduction      Hip Internal rotation 45  45*   Hip External rotation 45  45   Knee Flexion  4  4  Knee Extension  4+  4+  Ankle Dorsiflexion  4+  4+  Ankle Plantarflexion      Ankle Inversion      Ankle Eversion       (Blank rows = not tested) * pain   LUMBAR SPECIAL TESTS:  neg: Ely's test B, SLR B, slump B,  FUNCTIONAL TESTS:  Pain with bed mobilities    TREATMENT DATE:                                                                                                                               02/02/2024  Therapeutic Exercise:  Aerobic: Supine: LTR on ball 2 minutes, 90/90 relief 3 minutes, hip add isometric x10 5" holds, bent knee fall outs x10 5" holds B Prone:  Seated:  Standing: Neuromuscular Re-education: Manual Therapy: lumbar traction on ball 5 minutes, percussion gun for vibration to left leg - tolerated well 6 minutes Therapeutic Activity: Self Care: Trigger Point Dry Needling:  Modalities: thermotherapy to lumbar spine during supine interventions     PATIENT  EDUCATION:  Education details: on current presentation, on HEP, on clinical outcomes score and POC Person educated: Patient Education method: Programmer, multimedia, Demonstration, and Handouts Education comprehension: verbalized understanding   HOME EXERCISE PROGRAM: [id number]  ASSESSMENT:  CLINICAL IMPRESSION: Patient presents to physical therapy with complaints of chronic low back pain radiating into bilateral lower extremities.  Patient with presentation of intermittent numbness as well as left hip pain that is more muscular in nature.  Patient presents with limitations in range of motion, strength and posture that is likely contributing to current presentation.  Patient would greatly benefit from skilled PT to address physical impairments and improve overall function at home and in the community.  OBJECTIVE IMPAIRMENTS: decreased activity tolerance, decreased ROM, decreased strength, improper body mechanics, postural dysfunction, and pain.  ACTIVITY LIMITATIONS: standing, sleeping, and locomotion level  PARTICIPATION LIMITATIONS: cleaning and community activity  PERSONAL FACTORS: Age and Fitness are also affecting patient's functional outcome.   REHAB POTENTIAL: Good  CLINICAL DECISION MAKING: Stable/uncomplicated  EVALUATION COMPLEXITY: Low   GOALS: Goals reviewed with patient? yes  SHORT TERM GOALS: Target date: 03/15/2024   Patient will be independent in self management strategies to improve quality of life and functional outcomes. Baseline: New Program Goal status: INITIAL  2.  Patient will report at least 50% improvement in overall symptoms and/or function to demonstrate improved functional mobility Baseline: 0% better Goal status: INITIAL  3.  Patient will demonstrate pain-free lumbar range of motion in all directions Baseline: Painful Goal status: INITIAL    LONG TERM GOALS: Target date: 04/26/2024    Patient will report at least 75% improvement in overall symptoms  and/or function to demonstrate improved functional mobility Baseline: 0% better Goal status: INITIAL  2.  Patient will score at least 2 points higher on PSFS average to demonstrate change in overall function. Baseline: see above Goal status: INITIAL  3.  Patient will be able to roll over in bed without pain to improve bed mobilities Baseline: Painful in hip Goal status: INITIAL  4.  Patient will demonstrate pain-free hip range of motion in all directions Baseline: Painful Goal status: INITIAL   PLAN:  PT FREQUENCY: 1-2x/week for total of 16 visits over 12 weeks certification  PT DURATION: 12 weeks  PLANNED INTERVENTIONS: 97110-Therapeutic exercises, 97530- Therapeutic activity, 97112- Neuromuscular re-education, 97535- Self Care, 16109- Manual therapy, [phone]- Gait training, [phone]- Orthotic Fit/training, [phone]- Canalith repositioning, [id number]- Aquatic Therapy, 97014- Electrical stimulation (unattended), [phone]- Ionotophoresis 4mg /ml Dexamethasone , Patient/Family education, Balance training, Stair training, Taping, Dry Needling, Joint mobilization, Joint manipulation, Spinal manipulation, Spinal mobilization, Cryotherapy, and Moist heat   PLAN FOR NEXT SESSION: Manual interventions to left hip and groin, trial long axis traction and lumbar traction again, 9090 relief, isometrics and lower extremity and core strengthening exercises   2:55 PM, 02/02/24 Tabitha Ewings, DPT Physical Therapy with [place]

## 2024-02-02 ENCOUNTER — Encounter: Payer: Self-pay | Admitting: Physical Therapy

## 2024-02-02 ENCOUNTER — Other Ambulatory Visit: Payer: Self-pay | Admitting: Internal Medicine

## 2024-02-02 ENCOUNTER — Ambulatory Visit: Admitting: Physical Therapy

## 2024-02-02 DIAGNOSIS — M5459 Other low back pain: Secondary | ICD-10-CM

## 2024-02-02 DIAGNOSIS — M6281 Muscle weakness (generalized): Secondary | ICD-10-CM | POA: Diagnosis not present

## 2024-02-02 NOTE — Therapy (Addendum)
 OUTPATIENT PHYSICAL THERAPY THORACOLUMBAR TREATMENT PHYSICAL THERAPY DISCHARGE SUMMARY  Visits from Start of Care: 2  Current functional level related to goals / functional outcomes: Could not be assessed due to unplanned discharge   Remaining deficits: Could not be assessed due to unplanned discharge   Education / Equipment: Could not be assessed due to unplanned discharge   Patient agrees to discharge. Patient goals were not met. Patient is being discharged due to not returning since the last visit.  2:35 PM, 04/04/24 Mary Krueger, DPT Physical Therapy with [place]    Patient Name: Mary Krueger MRN: [id number] DOB:13-Aug-1946, 78 y.o., female Today's Date: 02/06/2024  END OF SESSION:  PT End of Session - 02/06/24 1018     Visit Number 2    Number of Visits 16    Date for PT Re-Evaluation 04/26/24    Authorization Type aetna medicare $10 copay    PT Start Time 1018    PT Stop Time 1056    PT Time Calculation (min) 38 min    Activity Tolerance Patient tolerated treatment well    Behavior During Therapy WFL for tasks assessed/performed              Past Medical History:  Diagnosis Date   Allergy    Annual physical exam 11/08/2023   Anxiety    Chronic   Arthritis    generalized   Breast mass, left 2005   fatty on U/S   Cataract    bilateral sx   Fibroid 1987   TAH/USO   GERD (gastroesophageal reflux disease)    Glaucoma    on meds   Hyperlipidemia    diet controlled   Uses hearing aid    Bilateral    Vitamin D  deficiency    Past Surgical History:  Procedure Laterality Date   ABDOMINAL HYSTERECTOMY  1989   BALLOON DILATION N/A 03/05/2019   Procedure: BALLOON DILATION;  Surgeon: Shila Gustav GAILS, MD;  Location: [facility];  Service: Endoscopy;  Laterality: N/A;  TTS balloon dilation   CATARACT EXTRACTION W/ INTRAOCULAR LENS IMPLANT Bilateral 12/2015 01/2016   COSMETIC SURGERY     Mini face lift   ESOPHAGEAL MANOMETRY N/A 03/05/2019    Procedure: ESOPHAGEAL MANOMETRY (EM);  Surgeon: Shila Gustav GAILS, MD;  Location: [facility];  Service: Endoscopy;  Laterality: N/A;   ESOPHAGOGASTRODUODENOSCOPY  01/2019   ESOPHAGOGASTRODUODENOSCOPY (EGD) WITH PROPOFOL  N/A 01/17/2019   Procedure: ESOPHAGOGASTRODUODENOSCOPY (EGD) WITH PROPOFOL ;  Surgeon: Albertus Gordy HERO, MD;  Location: [facility] ENDOSCOPY;  Service: Gastroenterology;  Laterality: N/A;   ESOPHAGOGASTRODUODENOSCOPY (EGD) WITH PROPOFOL  N/A 03/05/2019   Procedure: ESOPHAGOGASTRODUODENOSCOPY (EGD) WITH PROPOFOL ;  Surgeon: Shila Gustav GAILS, MD;  Location: [facility];  Service: Endoscopy;  Laterality: N/A;   EYE SURGERY  06/2012   eye lift   FOREIGN BODY REMOVAL  01/17/2019   Procedure: FOREIGN BODY REMOVAL;  Surgeon: Albertus Gordy HERO, MD;  Location: [facility] ENDOSCOPY;  Service: Gastroenterology;;   LIPOMA EXCISION Left 2009   left arm   STOMACH SURGERY  1997   Sphincter   TONSILLECTOMY AND ADENOIDECTOMY  1972   TOTAL ABDOMINAL HYSTERECTOMY W/ BILATERAL SALPINGOOPHORECTOMY  1989   TRIGGER FINGER RELEASE     right   Patient Active Problem List   Diagnosis Date Noted   Annual physical exam 11/08/2023   Psoriasis 11/08/2023   Right sciatic nerve pain 11/08/2023   Hypertensive heart disease without heart failure 07/14/2023   Statin myopathy 07/14/2023   Adrenal mass (HCC) 07/14/2023   Cough  03/28/2022   Generalized weakness 03/27/2022   COVID-19 virus infection 03/27/2022   Peripheral arterial disease (HCC) 02/09/2022   Vitamin D  deficiency 08/12/2021   Hordeolum externum of left upper eyelid 08/12/2021   Estrogen deficiency 08/12/2021   Achalasia of esophagus    Esophageal dysphagia    Pure hypercholesterolemia 12/14/2018   Tachycardia 06/17/2018   Abnormal glucose 06/17/2018   Primary snoring 07/12/2016   SHOULDER PAIN, RIGHT, CHRONIC 12/17/2008   Mixed hyperlipidemia 10/24/2008   Generalized anxiety disorder 10/24/2008    PCP: Jarold Medici, MD  REFERRING PROVIDER:  Joane Artist RAMAN, MD  REFERRING DIAG: [phone] (ICD-10-CM) - Chronic bilateral low back pain without sciatica  Rationale for Evaluation and Treatment: Rehabilitation  THERAPY DIAG:  Other low back pain  Muscle weakness (generalized)  ONSET DATE: Around thanksgiving 2024  SUBJECTIVE:                                                                                                                                                                                           SUBJECTIVE STATEMENT: 02/06/2024 Felt better after last session for a coulple of days and is now having some pain in her back and left hip.   EVAL:States she has pain going down both legs and her right leg starts to tingle and it feels like she loses control over it. Walking makes it worse.  Sitting makes it better reports she does not usually take medication but she did take Tylenol  today and it seems to be helping.  Reports flexion also makes it better.  She also has difficulties rolling over in bed due to pain in the left hip.  She has been participating in a [employer] program and that is also painful at times in her back and legs   PERTINENT HISTORY:  hysterectomy  PAIN:  Are you having pain? Yes: NPRS scale: 7/10  Pain location: back and gong down both legs R>L  Pain description: ache Aggravating factors: working out, walking Relieving factors: sitting, tylenol    PRECAUTIONS: None  RED FLAGS: None   WEIGHT BEARING RESTRICTIONS: No  FALLS:  Has patient fallen in last 6 months? No   OCCUPATION: not currently working, walking and working out  PLOF: Independent  PATIENT GOALS: pain relief      OBJECTIVE:  Note: Objective measures were completed at Evaluation unless otherwise noted.  DIAGNOSTIC FINDINGS:  Lumbar xray 12/1923 IMPRESSION: 1. No fracture or acute finding. 2. Scoliosis and degenerative changes as detailed. Degenerative changes at L4-L5 have progressed since the prior  radiographs.  PATIENT SURVEYS:  Patient-specific activity functional scoring scheme (Point to one number):  0 represents "  unable to perform." 10 represents "able to perform at prior level. 0 1 2 3 4 5 6 7 8 9  10 (Date and Score) Activity Initial  Activity Eval     Walking   4    Working out   5    Stairs  5    Additional Additional Total score = sum of the activity scores/number of activities Minimum detectable change (90%CI) for average score = 2 points Minimum detectable change (90%CI) for single activity score = 3 points PSFS developed by: Rosalee MYRTIS Marvis KYM Charlet CHRISTELLA., & Binkley, J. (1995). Assessing disability and change on individual  patients: a report of a patient specific measure. Physiotherapy [place], 47, [phone]. Reproduced with the permission of the authors  Score: 14/3=4.66   COGNITION: Overall cognitive status: Within functional limits for tasks assessed     SENSATION: Not tested    POSTURE: rounded shoulders, forward head, posterior pelvic tilt, and flexed trunk   PALPATION: Tenderness to palpation along right glutes causing numbness into right lower extremity no tenderness to palpation noted in lumbar spine  LUMBAR ROM:   AROM eval  Flexion WFL (feels good)  Extension 50% limited  Right lateral flexion 50% limited *  Left lateral flexion 50% limited *  Right rotation   Left rotation    (Blank rows = not tested) *Pain   LE Measurements Lower Extremity Right EVAL Left EVAL   A/PROM MMT A/PROM MMT  Hip Flexion WFL 4 WFL* 4  Hip Extension  3+*  3+  Hip Abduction   *   Hip Adduction      Hip Internal rotation 45  45*   Hip External rotation 45  45   Knee Flexion  4  4  Knee Extension  4+  4+  Ankle Dorsiflexion  4+  4+  Ankle Plantarflexion      Ankle Inversion      Ankle Eversion       (Blank rows = not tested) * pain   LUMBAR SPECIAL TESTS:  neg: Ely's test B, SLR B, slump B,  FUNCTIONAL TESTS:  Pain with bed  mobilities    TREATMENT DATE:                                                                                                                               02/06/2024  Therapeutic Exercise:  Review of HEP and anatomy Supine: LTR on ball 2 minutes, 90/90 relief 3 minutes, SAQs 2 minutes - alternating  legs hip add isometric x10 5 holds, bent knee fall outs 2 minutes holds B--> THEN WITH PELVIC TILT 2 MINUTES  Prone:  Seated:  Standing: Neuromuscular Re-education: pelvic tilts - verbal and tactile cues - tolerated well 3 minutes  Manual Therapy:percussion gun for vibration to left leg - tolerated well 8 minutes, lateral left hip mob grade II/II and long axis traction of left hip - tolerated well  Therapeutic Activity: Self Care: Trigger  Point Dry Needling:  Modalities: thermotherapy to lumbar spine during supine interventions     PATIENT EDUCATION:  Education details: on HEP Person educated: Patient Education method: Programmer, multimedia, Facilities manager, and Handouts Education comprehension: verbalized understanding   HOME EXERCISE PROGRAM: [id number]  ASSESSMENT:  CLINICAL IMPRESSION: 5/5/2025Patient tolerated use of percussion gun and traction/joint mobilization of hip well. Added pelvic tilts to HEP which improved tolerance to previous exercises. Less pain noted end of session. Will continue with current POC as tolerated.   Eval: Patient presents to physical therapy with complaints of chronic low back pain radiating into bilateral lower extremities.  Patient with presentation of intermittent numbness as well as left hip pain that is more muscular in nature.  Patient presents with limitations in range of motion, strength and posture that is likely contributing to current presentation.  Patient would greatly benefit from skilled PT to address physical impairments and improve overall function at home and in the community.  OBJECTIVE IMPAIRMENTS: decreased activity tolerance, decreased ROM,  decreased strength, improper body mechanics, postural dysfunction, and pain.   ACTIVITY LIMITATIONS: standing, sleeping, and locomotion level  PARTICIPATION LIMITATIONS: cleaning and community activity  PERSONAL FACTORS: Age and Fitness are also affecting patient's functional outcome.   REHAB POTENTIAL: Good  CLINICAL DECISION MAKING: Stable/uncomplicated  EVALUATION COMPLEXITY: Low   GOALS: Goals reviewed with patient? yes  SHORT TERM GOALS: Target date: 03/15/2024   Patient will be independent in self management strategies to improve quality of life and functional outcomes. Baseline: New Program Goal status: INITIAL  2.  Patient will report at least 50% improvement in overall symptoms and/or function to demonstrate improved functional mobility Baseline: 0% better Goal status: INITIAL  3.  Patient will demonstrate pain-free lumbar range of motion in all directions Baseline: Painful Goal status: INITIAL    LONG TERM GOALS: Target date: 04/26/2024    Patient will report at least 75% improvement in overall symptoms and/or function to demonstrate improved functional mobility Baseline: 0% better Goal status: INITIAL  2.  Patient will score at least 2 points higher on PSFS average to demonstrate change in overall function. Baseline: see above Goal status: INITIAL  3.  Patient will be able to roll over in bed without pain to improve bed mobilities Baseline: Painful in hip Goal status: INITIAL  4.  Patient will demonstrate pain-free hip range of motion in all directions Baseline: Painful Goal status: INITIAL   PLAN:  PT FREQUENCY: 1-2x/week for total of 16 visits over 12 weeks certification  PT DURATION: 12 weeks  PLANNED INTERVENTIONS: 97110-Therapeutic exercises, 97530- Therapeutic activity, 97112- Neuromuscular re-education, 97535- Self Care, 02859- Manual therapy, [phone]- Gait training, [phone]- Orthotic Fit/training, [phone]- Canalith repositioning, [id number]- Aquatic  Therapy, 97014- Electrical stimulation (unattended), [phone]- Ionotophoresis 4mg /ml Dexamethasone , Patient/Family education, Balance training, Stair training, Taping, Dry Needling, Joint mobilization, Joint manipulation, Spinal manipulation, Spinal mobilization, Cryotherapy, and Moist heat   PLAN FOR NEXT SESSION: continue with left hip traction, Manual interventions to left hip and groin, trial long axis traction and lumbar traction again, 9090 relief, isometrics and lower extremity and core strengthening exercises   11:02 AM, 02/06/24 Mary Krueger, DPT Physical Therapy with [place]

## 2024-02-06 ENCOUNTER — Ambulatory Visit: Admitting: Physical Therapy

## 2024-02-06 ENCOUNTER — Encounter: Payer: Self-pay | Admitting: Physical Therapy

## 2024-02-06 DIAGNOSIS — M6281 Muscle weakness (generalized): Secondary | ICD-10-CM | POA: Diagnosis not present

## 2024-02-06 DIAGNOSIS — M5459 Other low back pain: Secondary | ICD-10-CM | POA: Diagnosis not present

## 2024-02-07 ENCOUNTER — Ambulatory Visit: Admitting: Family Medicine

## 2024-02-07 VITALS — BP 128/68 | HR 69 | Ht 64.0 in | Wt 148.0 lb

## 2024-02-07 DIAGNOSIS — M5442 Lumbago with sciatica, left side: Secondary | ICD-10-CM | POA: Diagnosis not present

## 2024-02-07 DIAGNOSIS — M5441 Lumbago with sciatica, right side: Secondary | ICD-10-CM

## 2024-02-07 DIAGNOSIS — G8929 Other chronic pain: Secondary | ICD-10-CM | POA: Diagnosis not present

## 2024-02-07 NOTE — Patient Instructions (Addendum)
 Thank you for coming in today.   You should hear from MRI scheduling within 1 week. If you do not hear please let me know.    Check back after we get the MRI results.

## 2024-02-07 NOTE — Progress Notes (Signed)
 [employer] PRE[facility] Weekly Session  Patient Details  Name: Mary Krueger MRN: [id number] Date of Birth: 1946-06-20 Age: 78 y.o. PCP: Cleave Curling, MD  There were no vitals filed for this visit.   [employer] Weekly seesion - 02/07/24 1100       [employer] "PREP" Location   [facility] "PREP" Location [facility]      Weekly Session   Topic Discussed Hitting roadblocks   Reviewed end of program documentation, reviewed 100 calorie food labels.   Classes attended to date 24             Jim Motts 02/07/2024, 11:44 AM

## 2024-02-07 NOTE — Progress Notes (Signed)
   Joanna Muck, PhD, LAT, ATC acting as a scribe for Garlan Juniper, MD.  Mary Krueger is a 78 y.o. female who presents to [facility] today for f/u LBP. Pt was last seen by Dr. Alease Hunter on 12/27/23 and was prescribed prednisone  and referred to PT, completing 2 visits.  Today, pt reports low back is really hurting today. She relates the increased pain do to doing water aerobics last night. Pt locates pain to both sides of the low back, w/ weakness into the R leg, radiating pain into lateral aspect of the L hip.   He notes at times she has weakness into her legs.  This happens sporadically.  Today is a good day.  Dx imaging: 12/21/23 L-spine XR   Pertinent review of systems: No fevers or chills  Relevant historical information: Peripheral arterial disease   Exam:  BP 128/68   Pulse 69   Ht 5\' 4"  (1.626 m)   Wt 148 lb (67.1 kg)   LMP 10/04/1984   SpO2 99%   BMI 25.40 kg/m  General: Well Developed, well nourished, and in no acute distress.   MSK: L-spine: Normal appearing Nontender palpation spinal midline decreased lumbar motion.  Lower extremity strength is intact.    Lab and Radiology Results  EXAM: LUMBAR SPINE - COMPLETE 4+ VIEW   COMPARISON:  07/13/2010.   FINDINGS: No fracture or bone lesion.   Mild curvature, convex to the right of the lower thoracic spine and to the left the lumbar spine, apex at L4.   Grade 1 anterolisthesis of L4 on L5.   Minor loss of disc height at L3-L4. Moderate loss of disc height at L4-L5. Remaining disc spaces are well preserved.   There are facet degenerative changes bilaterally at L2-L3, L3-L4 and L4-L5.   Atherosclerotic calcifications noted along the abdominal aorta. Soft tissues otherwise unremarkable.   IMPRESSION: 1. No fracture or acute finding. 2. Scoliosis and degenerative changes as detailed. Degenerative changes at L4-L5 have progressed since the prior radiographs.      Electronically Signed   By: Amanda Jungling M.D.   On: 01/04/2024 10:44 I, Garlan Juniper, personally (independently) visualized and performed the interpretation of the images attached in this note.      Assessment and Plan: 78 y.o. female with lumbar radiculopathy.  Unfortunately not improving sufficiently with physical therapy over the last 6 weeks.  Plan for MRI lumbar spine to further characterize source of pain.  Okay to continue PT while waiting on this MRI.  Anticipate either an epidural steroid injection or return to clinic following MRI results.   PDMP not reviewed this encounter. Orders Placed This Encounter  Procedures   MR Lumbar Spine Wo Contrast    Standing Status:   Future    Expiration Date:   02/06/2025    What is the patient's sedation requirement?:   No Sedation    Does the patient have a pacemaker or implanted devices?:   No    Preferred imaging location?:   [facility] (table limit-550lbs)   No orders of the defined types were placed in this encounter.    Discussed warning signs or symptoms. Please see discharge instructions. Patient expresses understanding.   The above documentation has been reviewed and is accurate and complete Garlan Juniper, M.D.

## 2024-02-10 ENCOUNTER — Other Ambulatory Visit: Payer: Self-pay | Admitting: Internal Medicine

## 2024-02-14 ENCOUNTER — Telehealth: Payer: Self-pay | Admitting: Pharmacist

## 2024-02-14 ENCOUNTER — Encounter: Admitting: Physical Therapy

## 2024-02-14 NOTE — Telephone Encounter (Signed)
 Received request for chronic replacement for Repatha .  Called [facility] and gave them verbal for placement of 1 pen.

## 2024-02-14 NOTE — Progress Notes (Signed)
 [employer] [facility] Weekly Session  Patient Details  Name: Mary Krueger MRN: [id number] Date of Birth: 21-Apr-1946 Age: 78 y.o. PCP: Cleave Curling, MD  Vitals:   02/14/24 1113  Weight: 146 lb 8 oz (66.5 kg)     [facility] Weekly seesion - 02/14/24 1100       [facility] "PREP" Location   [facility] "PREP" Location [facility]      Weekly Session   Topic Discussed Other   How fit and strong survey completed; fit testing completed; final documents reviewed, final assessment visit scheduled for Thursday   Classes attended to date 38             Mary Krueger B Klayton Monie 02/14/2024, 11:14 AM

## 2024-02-16 NOTE — Progress Notes (Signed)
 [employer] PREP Evaluation  Patient Details  Name: Mary Krueger MRN: [id number] Date of Birth: 1946/03/30 Age: 78 y.o. PCP: Cleave Curling, MD  Vitals:   02/16/24 1112  BP: 130/76  Pulse: 88  SpO2: 98%  Weight: 145 lb 12.8 oz (66.1 kg)     [facility] Eval - 02/16/24 1100       [facility] "PREP" Location   [facility] "PREP[facility] Location [facility]      Referral    Program Start Date 11/22/23    Program End Date 02/16/24      Measurement   Waist Circumference 35.5 inches    Waist Circumference End Program 34.5 inches    Hip Circumference 40 inches    Hip Circumference End Program 40 inches    Body fat 38 percent      Mobility and Daily Activities   I find it easy to walk up or down two or more flights of stairs. 3    I have no trouble taking out the trash. 2    I do housework such as vacuuming and dusting on my own without difficulty. 2    I can easily lift a gallon of milk (8lbs). 4    I can easily walk a mile. 4    I have no trouble reaching into high cupboards or reaching down to pick up something from the floor. 4    I do not have trouble doing out-door work such as Loss adjuster, chartered, raking leaves, or gardening. 3      Mobility and Daily Activities   I feel younger than my age. 3    I feel independent. 3    I feel energetic. 2    I live an active life.  4    I feel strong. 4    I feel healthy. 3    I feel active as other people my age. 3      How fit and strong are you.   Fit and Strong Total Score 44            Past Medical History:  Diagnosis Date   Allergy    Annual physical exam 11/08/2023   Anxiety    Chronic   Arthritis    generalized   Breast mass, left 2005   fatty on U/S   Cataract    bilateral sx   Fibroid 1987   TAH/USO   GERD (gastroesophageal reflux disease)    Glaucoma    on meds   Hyperlipidemia    diet controlled   Uses hearing aid    Bilateral    Vitamin D  deficiency    Past Surgical History:  Procedure Laterality Date   ABDOMINAL  HYSTERECTOMY  1989   BALLOON DILATION N/A 03/05/2019   Procedure: BALLOON DILATION;  Surgeon: Sergio Dandy, MD;  Location: [facility];  Service: Endoscopy;  Laterality: N/A;  TTS balloon dilation   CATARACT EXTRACTION W/ INTRAOCULAR LENS IMPLANT Bilateral 12/2015 01/2016   COSMETIC SURGERY     Mini face lift   ESOPHAGEAL MANOMETRY N/A 03/05/2019   Procedure: ESOPHAGEAL MANOMETRY (EM);  Surgeon: Sergio Dandy, MD;  Location: [facility];  Service: Endoscopy;  Laterality: N/A;   ESOPHAGOGASTRODUODENOSCOPY  01/2019   ESOPHAGOGASTRODUODENOSCOPY (EGD) WITH PROPOFOL  N/A 01/17/2019   Procedure: ESOPHAGOGASTRODUODENOSCOPY (EGD) WITH PROPOFOL ;  Surgeon: Nannette Babe, MD;  Location: [facility];  Service: Gastroenterology;  Laterality: N/A;   ESOPHAGOGASTRODUODENOSCOPY (EGD) WITH PROPOFOL  N/A 03/05/2019   Procedure: ESOPHAGOGASTRODUODENOSCOPY (EGD) WITH PROPOFOL ;  Surgeon: Nandigam, Kavitha V, MD;  Location: [facility];  Service: Endoscopy;  Laterality: N/A;   EYE SURGERY  06/2012   eye lift   FOREIGN BODY REMOVAL  01/17/2019   Procedure: FOREIGN BODY REMOVAL;  Surgeon: Nannette Babe, MD;  Location: [facility];  Service: Gastroenterology;;   LIPOMA EXCISION Left 2009   left arm   STOMACH SURGERY  1997   Sphincter   TONSILLECTOMY AND ADENOIDECTOMY  1972   TOTAL ABDOMINAL HYSTERECTOMY W/ BILATERAL SALPINGOOPHORECTOMY  1989   TRIGGER FINGER RELEASE     right   Social History   Tobacco Use  Smoking Status Former   Current packs/day: 0.00   Average packs/day: 1.5 packs/day for 18.0 years (27.0 ttl pk-yrs)   Types: Cigarettes   Start date: 75   Quit date: 10/05/1979   Years since quitting: 44.3   Passive exposure: Never  Smokeless Tobacco Never  Tobacco Comments   quit in 1981  Inches lost: 1 BP at start of program: 140/80 02/16/24: 130/76 Education sessions: 12 Workout sessions completed: 10   Mary Krueger Mary Krueger 02/16/2024, 11:18 AM

## 2024-02-21 ENCOUNTER — Encounter: Admitting: Physical Therapy

## 2024-02-23 ENCOUNTER — Encounter: Admitting: Physical Therapy

## 2024-02-26 ENCOUNTER — Ambulatory Visit
Admission: RE | Admit: 2024-02-26 | Discharge: 2024-02-26 | Disposition: A | Discharge status: Home / Self Care | Source: Ambulatory Visit | Attending: Family Medicine | Admitting: Family Medicine

## 2024-02-26 DIAGNOSIS — M4722 Other spondylosis with radiculopathy, cervical region: Secondary | ICD-10-CM | POA: Diagnosis not present

## 2024-02-26 DIAGNOSIS — M48061 Spinal stenosis, lumbar region without neurogenic claudication: Secondary | ICD-10-CM | POA: Diagnosis not present

## 2024-02-26 DIAGNOSIS — G8929 Other chronic pain: Secondary | ICD-10-CM

## 2024-02-28 ENCOUNTER — Encounter: Admitting: Physical Therapy

## 2024-03-01 ENCOUNTER — Encounter: Admitting: Physical Therapy

## 2024-03-06 ENCOUNTER — Encounter: Admitting: Physical Therapy

## 2024-03-08 ENCOUNTER — Encounter: Admitting: Physical Therapy

## 2024-03-14 ENCOUNTER — Ambulatory Visit: Payer: Self-pay | Admitting: Family Medicine

## 2024-03-14 DIAGNOSIS — G8929 Other chronic pain: Secondary | ICD-10-CM

## 2024-03-14 NOTE — Progress Notes (Signed)
 Low back MRI shows spinal stenosis and potentially pinched nerves.  You do have arthritis as well in the back which could cause back pain.  I will order a back injection which could help.  You should hear from radiology soon about scheduling.  Recommend schedule with me after the back injection.

## 2024-03-28 ENCOUNTER — Telehealth: Payer: Self-pay | Admitting: Family Medicine

## 2024-03-28 NOTE — Telephone Encounter (Signed)
 Noted

## 2024-03-28 NOTE — Telephone Encounter (Signed)
 Pt called, will be cancelling upcoming epidural. She feels great, has changed diet and added exercise. Has overall body aches but noted this is a possible side effect of the Repatha . Plans to discuss with the prescribing physician. Just FYI per pt request.

## 2024-03-28 NOTE — Discharge Instructions (Signed)

## 2024-03-29 ENCOUNTER — Inpatient Hospital Stay
Admission: RE | Admit: 2024-03-29 | Discharge: 2024-03-29 | Disposition: A | Discharge status: Home / Self Care | Source: Ambulatory Visit | Attending: Family Medicine | Admitting: Family Medicine

## 2024-04-04 ENCOUNTER — Ambulatory Visit

## 2024-04-04 ENCOUNTER — Other Ambulatory Visit: Payer: Self-pay | Admitting: Internal Medicine

## 2024-04-04 VITALS — BP 118/60 | HR 72 | Temp 97.8°F | Ht 64.0 in | Wt 146.4 lb

## 2024-04-04 DIAGNOSIS — Z0000 Encounter for general adult medical examination without abnormal findings: Secondary | ICD-10-CM | POA: Diagnosis not present

## 2024-04-04 MED ORDER — PAROXETINE HCL 30 MG PO TABS: 30.0000 mg | ORAL_TABLET | Freq: Every day | ORAL | 1 refills | Status: AC

## 2024-04-04 NOTE — Patient Instructions (Signed)
 [name] , Thank you for taking time out of your busy schedule to complete your Annual Wellness Visit with me. I enjoyed our conversation and look forward to speaking with you again next year. I, as well as your care team,  appreciate your ongoing commitment to your health goals. Please review the following plan we discussed and let me know if I can assist you in the future. Your Game plan/ To Do List    Referrals: If you haven't heard from the office you've been referred to, please reach out to them at the phone provided.  N/a Follow up Visits: Next Medicare AWV with our clinical staff: office will schedule   Have you seen your provider in the last 6 months (3 months if uncontrolled diabetes)? Yes Next Office Visit with your provider: 05/07/2024 at 10:40  Clinician Recommendations:  Aim for 30 minutes of exercise or brisk walking, 6-8 glasses of water, and 5 servings of fruits and vegetables each day.       This is a list of the screening recommended for you and due dates:  Health Maintenance  Topic Date Due   COVID-19 Vaccine (3 - Pfizer risk series) 04/20/2024*   Zoster (Shingles) Vaccine (2 of 2) 07/05/2024*   Hepatitis B Vaccine (3 of 3 - 19+ 3-dose series) 04/04/2025*   Flu Shot  05/04/2024   Medicare Annual Wellness Visit  04/04/2025   DTaP/Tdap/Td vaccine (2 - Td or Tdap) 06/22/2027   Pneumococcal Vaccine for age over 23  Completed   DEXA scan (bone density measurement)  Completed   HPV Vaccine  Aged Out   Meningitis B Vaccine  Aged Out   Colon Cancer Screening  Discontinued   Hepatitis C Screening  Discontinued  *Topic was postponed. The date shown is not the original due date.    Advanced directives: (In Chart) A copy of your advanced directives are scanned into your chart should your provider ever need it. Advance Care Planning is important because it:  [x]  Makes sure you receive the medical care that is consistent with your values, goals, and preferences  [x]  It  provides guidance to your family and loved ones and reduces their decisional burden about whether or not they are making the right decisions based on your wishes.  Follow the link provided in your after visit summary or read over the paperwork we have mailed to you to help you started getting your Advance Directives in place. If you need assistance in completing these, please reach out to us  so that we can help you!  See attachments for Preventive Care and Fall Prevention Tips.

## 2024-04-04 NOTE — Progress Notes (Signed)
 Subjective:   Mary Krueger is a 78 y.o. who presents for a Medicare Wellness preventive visit.  As a reminder, Annual Wellness Visits don't include a physical exam, and some assessments may be limited, especially if this visit is performed virtually. We may recommend an in-person follow-up visit with your provider if needed.  Visit Complete: In person    Persons Participating in Visit: Patient.  AWV Questionnaire: No: Patient Medicare AWV questionnaire was not completed prior to this visit.  Cardiac Risk Factors include: advanced age (>64men, >28 women);dyslipidemia;hypertension     Objective:    Today's Vitals   04/04/24 1553 04/04/24 1554  BP: 118/60   Pulse: 72   Temp: 97.8 F (36.6 C)   TempSrc: Oral   SpO2: 98%   Weight: 146 lb 6.4 oz (66.4 kg)   Height: 5' 4 (1.626 m)   PainSc:  3    Body mass index is 25.13 kg/m.     04/04/2024    3:59 PM 02/02/2024    2:00 PM 03/31/2023    4:01 PM 03/27/2022    3:34 PM 01/07/2021    9:35 AM 12/26/2019   10:13 AM 03/02/2019    5:36 PM  Advanced Directives  Does Patient Have a Medical Advance Directive? Yes Yes Yes No Yes Yes Yes  Type of Estate agent of [place];Living will Living will Living will  Healthcare Power of [place];Living will Healthcare Power of [place];Living will Living will  Copy of Healthcare Power of Attorney in Chart? Yes - validated most recent copy scanned in chart (See row information)    No - copy requested No - copy requested No - copy requested   Would patient like information on creating a medical advance directive?    No - Patient declined        Data saved with a previous flowsheet row definition    Current Medications (verified) Outpatient Encounter Medications as of 04/04/2024  Medication Sig   Evolocumab  (REPATHA  SURECLICK) 140 MG/ML SOAJ Inject 140 mg into the skin every 14 (fourteen) days.   latanoprost  (XALATAN ) 0.005 % ophthalmic solution Place 1 drop into both eyes  at bedtime.   PARoxetine  (PAXIL ) 30 MG tablet TAKE 1 TABLET BY MOUTH DAILY   VITAMIN D  PO Take 1 tablet by mouth daily at 6 (six) AM.   No facility-administered encounter medications on file as of 04/04/2024.    Allergies (verified) Paxlovid [nirmatrelvir-ritonavir], Simvastatin, Atorvastatin, and Rosuvastatin   History: Past Medical History:  Diagnosis Date   Allergy    Annual physical exam 11/08/2023   Anxiety    Chronic   Arthritis    generalized   Breast mass, left 2005   fatty on U/S   Cataract    bilateral sx   Fibroid 1987   TAH/USO   GERD (gastroesophageal reflux disease)    Glaucoma    on meds   Hyperlipidemia    diet controlled   Uses hearing aid    Bilateral    Vitamin D  deficiency    Past Surgical History:  Procedure Laterality Date   ABDOMINAL HYSTERECTOMY  1989   BALLOON DILATION N/A 03/05/2019   Procedure: BALLOON DILATION;  Surgeon: Shila Gustav GAILS, MD;  Location: [facility];  Service: Endoscopy;  Laterality: N/A;  TTS balloon dilation   CATARACT EXTRACTION W/ INTRAOCULAR LENS IMPLANT Bilateral 12/2015 01/2016   COSMETIC SURGERY     Mini face lift   ESOPHAGEAL MANOMETRY N/A 03/05/2019   Procedure: ESOPHAGEAL MANOMETRY (EM);  Surgeon: Nandigam, Kavitha V, MD;  Location: [facility];  Service: Endoscopy;  Laterality: N/A;   ESOPHAGOGASTRODUODENOSCOPY  01/2019   ESOPHAGOGASTRODUODENOSCOPY (EGD) WITH PROPOFOL  N/A 01/17/2019   Procedure: ESOPHAGOGASTRODUODENOSCOPY (EGD) WITH PROPOFOL ;  Surgeon: Albertus Gordy HERO, MD;  Location: [facility] ENDOSCOPY;  Service: Gastroenterology;  Laterality: N/A;   ESOPHAGOGASTRODUODENOSCOPY (EGD) WITH PROPOFOL  N/A 03/05/2019   Procedure: ESOPHAGOGASTRODUODENOSCOPY (EGD) WITH PROPOFOL ;  Surgeon: Shila Gustav GAILS, MD;  Location: [facility];  Service: Endoscopy;  Laterality: N/A;   EYE SURGERY  06/2012   eye lift   FOREIGN BODY REMOVAL  01/17/2019   Procedure: FOREIGN BODY REMOVAL;  Surgeon: Albertus Gordy HERO, MD;  Location: [facility]  ENDOSCOPY;  Service: Gastroenterology;;   LIPOMA EXCISION Left 2009   left arm   STOMACH SURGERY  1997   Sphincter   TONSILLECTOMY AND ADENOIDECTOMY  1972   TOTAL ABDOMINAL HYSTERECTOMY W/ BILATERAL SALPINGOOPHORECTOMY  1989   TRIGGER FINGER RELEASE     right   Family History  Problem Relation Age of Onset   Diabetes Mother    Anxiety disorder Mother    Depression Mother    Hearing loss Mother    Colon polyps Father 67   Prostate cancer Father    Colon cancer Father    Alcohol abuse Father    Cancer Father    Diabetes Brother    Prostate cancer Brother    Cancer Brother    COPD Sister    Esophageal cancer Neg Hx    Stomach cancer Neg Hx    Rectal cancer Neg Hx    Social History   Socioeconomic History   Marital status: Divorced    Spouse name: Not on file   Number of children: Not on file   Years of education: Not on file   Highest education level: Associate degree: occupational, Scientist, product/process development, or vocational program  Occupational History   Occupation: retired  Tobacco Use   Smoking status: Former    Current packs/day: 0.00    Average packs/day: 1.5 packs/day for 18.0 years (27.0 ttl pk-yrs)    Types: Cigarettes    Start date: 1963    Quit date: 10/05/1979    Years since quitting: 44.5    Passive exposure: Never   Smokeless tobacco: Never   Tobacco comments:    quit in 1981  Vaping Use   Vaping status: Never Used  Substance and Sexual Activity   Alcohol use: No   Drug use: No   Sexual activity: Not Currently    Partners: Male    Birth control/protection: Post-menopausal, Surgical, None  Other Topics Concern   Not on file  Social History Narrative   Lives alone.     Social Drivers of Corporate investment banker Strain: Low Risk  (04/04/2024)   Overall Financial Resource Strain (CARDIA)    Difficulty of Paying Living Expenses: Not hard at all  Food Insecurity: No Food Insecurity (04/04/2024)   Hunger Vital Sign    Worried About Running Out of Food in the Last  Year: Never true    Ran Out of Food in the Last Year: Never true  Transportation Needs: No Transportation Needs (04/04/2024)   PRAPARE - Administrator, Civil Service (Medical): No    Lack of Transportation (Non-Medical): No  Physical Activity: Sufficiently Active (04/04/2024)   Exercise Vital Sign    Days of Exercise per Week: 4 days    Minutes of Exercise per Session: 60 min  Stress: No Stress Concern Present (04/04/2024)  [employer] of Occupational Health - Occupational Stress Questionnaire    Feeling of Stress: Not at all  Social Connections: Moderately Isolated (04/04/2024)   Social Connection and Isolation Panel    Frequency of Communication with Friends and Family: More than three times a week    Frequency of Social Gatherings with Friends and Family: More than three times a week    Attends Religious Services: Never    Database administrator or Organizations: Yes    Attends Engineer, structural: More than 4 times per year    Marital Status: Divorced    Tobacco Counseling Counseling given: Not Answered Tobacco comments: quit in 1981    Clinical Intake:  Pre-visit preparation completed: Yes  Pain : 0-10 Pain Score: 3  Pain Location: Generalized Pain Descriptors / Indicators: Aching Pain Onset: More than a month ago Pain Frequency: Constant     Nutritional Status: BMI 25 -29 Overweight Nutritional Risks: None Diabetes: No  Lab Results  Component Value Date   HGBA1C 5.4 12/15/2018   HGBA1C 5.6 05/10/2018     How often do you need to have someone help you when you read instructions, pamphlets, or other written materials from your doctor or pharmacy?: 1 - Never  Interpreter Needed?: No  Information entered by :: NAllen LPN   Activities of Daily Living     04/04/2024    3:55 PM  In your present state of health, do you have any difficulty performing the following activities:  Hearing? 1  Comment declines hearing aids  Vision? 0   Difficulty concentrating or making decisions? 1  Comment memory sometimes  Walking or climbing stairs? 0  Dressing or bathing? 0  Doing errands, shopping? 0  Preparing Food and eating ? N  Using the Toilet? N  In the past six months, have you accidently leaked urine? N  Do you have problems with loss of bowel control? N  Managing your Medications? N  Managing your Finances? N  Housekeeping or managing your Housekeeping? N    Patient Care Team: Jarold Medici, MD as PCP - General (Internal Medicine) Raford Riggs, MD as PCP - Cardiology (Cardiology) Rudy Dorothyann DASEN, RPH-CPP (Pharmacist)  I have updated your Care Teams any recent Medical Services you may have received from other providers in the past year.     Assessment:   This is a routine wellness examination for Mary Krueger.  Hearing/Vision screen Hearing Screening - Comments:: Has hearing issues, but declines hearing aids Vision Screening - Comments:: Regular eye exam, Dr. Cleotilde   Goals Addressed             This Visit's Progress    Patient Stated       04/04/2024, wants to get rid of pain       Depression Screen     04/04/2024    4:01 PM 11/08/2023   12:01 PM 09/06/2023    8:50 AM 07/14/2023    9:13 AM 03/31/2023    4:02 PM 03/10/2023    9:28 AM 09/02/2022   10:07 AM  PHQ 2/9 Scores  PHQ - 2 Score 0 0 0 0 0 0 0  PHQ- 9 Score 0 0  0 0 0     Fall Risk     04/04/2024    4:00 PM 11/08/2023   12:01 PM 09/06/2023    8:50 AM 07/14/2023    9:13 AM 03/31/2023    9:21 AM  Fall Risk   Falls in the past year?  1 0 0 0 0  Comment fell off curb      Number falls in past yr: 0 0 0 0 0  Injury with Fall? 0 0 0 0 0  Risk for fall due to : Medication side effect No Fall Risks  No Fall Risks Medication side effect  Follow up Falls prevention discussed;Falls evaluation completed Falls evaluation completed  Falls evaluation completed Falls prevention discussed;Falls evaluation completed    MEDICARE RISK AT HOME:  Medicare  Risk at Home Any stairs in or around the home?: Yes If so, are there any without handrails?: No Home free of loose throw rugs in walkways, pet beds, electrical cords, etc?: Yes Adequate lighting in your home to reduce risk of falls?: Yes Life alert?: No Use of a cane, walker or w/c?: No Grab bars in the bathroom?: No Shower chair or bench in shower?: No Elevated toilet seat or a handicapped toilet?: Yes  TIMED UP AND GO:  Was the test performed?  No  Cognitive Function: 6CIT completed        04/04/2024    4:02 PM 03/31/2023    4:02 PM 01/07/2021    9:37 AM 12/26/2019   10:16 AM  6CIT Screen  What Year? 0 points 0 points 0 points 0 points  What month? 0 points 0 points 0 points 0 points  What time? 0 points 0 points 0 points 0 points  Count back from 20 0 points 0 points 0 points 0 points  Months in reverse 0 points 0 points 0 points 0 points  Repeat phrase 0 points 0 points 0 points 0 points  Total Score 0 points 0 points 0 points 0 points    Immunizations Immunization History  Administered Date(s) Administered   Fluad Quad(high Dose 65+) 08/11/2020, 09/02/2022, 07/13/2023   Hep A / Hep B 08/23/2017   Hepatitis A 06/21/2017, 08/23/2017   Hepatitis B 06/21/2017, 08/23/2017   Influenza, High Dose Seasonal PF 06/21/2019   Influenza-Unspecified 07/30/2018, 08/04/2021   MMR 06/21/2017   PFIZER(Purple Top)SARS-COV-2 Vaccination 11/27/2019, 03/19/2020   PNEUMOCOCCAL CONJUGATE-20 02/05/2022   Pneumococcal Polysaccharide-23 03/10/2010   Tdap 06/21/2017   Zoster Recombinant(Shingrix) 07/22/2017    Screening Tests Health Maintenance  Topic Date Due   COVID-19 Vaccine (3 - Pfizer risk series) 04/20/2024 (Originally 04/16/2020)   Zoster Vaccines- Shingrix (2 of 2) 07/05/2024 (Originally 09/16/2017)   Hepatitis B Vaccines (3 of 3 - 19+ 3-dose series) 04/04/2025 (Originally 12/19/2017)   INFLUENZA VACCINE  05/04/2024   Medicare Annual Wellness (AWV)  04/04/2025   DTaP/Tdap/Td (2 -  Td or Tdap) 06/22/2027   Pneumococcal Vaccine: 50+ Years  Completed   DEXA SCAN  Completed   HPV VACCINES  Aged Out   Meningococcal B Vaccine  Aged Out   Colonoscopy  Discontinued   Hepatitis C Screening  Discontinued    Health Maintenance  There are no preventive care reminders to display for this patient.  Health Maintenance Items Addressed: Up to date  Additional Screening:  Vision Screening: Recommended annual ophthalmology exams for early detection of glaucoma and other disorders of the eye. Would you like a referral to an eye doctor? No    Dental Screening: Recommended annual dental exams for proper oral hygiene  Community Resource Referral / Chronic Care Management: CRR required this visit?  No   CCM required this visit?  No   Plan:    I have personally reviewed and noted the following in the patient's chart:   Medical and social  history Use of alcohol, tobacco or illicit drugs  Current medications and supplements including opioid prescriptions. Patient is not currently taking opioid prescriptions. Functional ability and status Nutritional status Physical activity Advanced directives List of other physicians Hospitalizations, surgeries, and ER visits in previous 12 months Vitals Screenings to include cognitive, depression, and falls Referrals and appointments  In addition, I have reviewed and discussed with patient certain preventive protocols, quality metrics, and best practice recommendations. A written personalized care plan for preventive services as well as general preventive health recommendations were provided to patient.   Ardella FORBES Dawn, LPN   11/10/7972   After Visit Summary: (In Person-Printed) AVS printed and given to the patient  Notes: Nothing significant to report at this time.

## 2024-04-17 ENCOUNTER — Other Ambulatory Visit: Payer: Self-pay | Admitting: Obstetrics & Gynecology

## 2024-04-17 DIAGNOSIS — Z1231 Encounter for screening mammogram for malignant neoplasm of breast: Secondary | ICD-10-CM

## 2024-05-03 ENCOUNTER — Ambulatory Visit
Admission: RE | Admit: 2024-05-03 | Discharge: 2024-05-03 | Disposition: A | Discharge status: Home / Self Care | Source: Ambulatory Visit | Attending: Obstetrics & Gynecology | Admitting: Obstetrics & Gynecology

## 2024-05-03 DIAGNOSIS — Z1231 Encounter for screening mammogram for malignant neoplasm of breast: Secondary | ICD-10-CM

## 2024-05-06 ENCOUNTER — Other Ambulatory Visit: Payer: Self-pay | Admitting: Internal Medicine

## 2024-05-07 ENCOUNTER — Encounter: Payer: Self-pay | Admitting: Internal Medicine

## 2024-05-07 ENCOUNTER — Ambulatory Visit: Payer: Medicare HMO | Admitting: Internal Medicine

## 2024-05-07 VITALS — BP 126/78 | HR 77 | Temp 98.0°F | Ht 64.0 in | Wt 140.0 lb

## 2024-05-07 DIAGNOSIS — G720 Drug-induced myopathy: Secondary | ICD-10-CM | POA: Diagnosis not present

## 2024-05-07 DIAGNOSIS — T466X5A Adverse effect of antihyperlipidemic and antiarteriosclerotic drugs, initial encounter: Secondary | ICD-10-CM | POA: Diagnosis not present

## 2024-05-07 DIAGNOSIS — R197 Diarrhea, unspecified: Secondary | ICD-10-CM

## 2024-05-07 DIAGNOSIS — M2550 Pain in unspecified joint: Secondary | ICD-10-CM | POA: Diagnosis not present

## 2024-05-07 DIAGNOSIS — I739 Peripheral vascular disease, unspecified: Secondary | ICD-10-CM | POA: Diagnosis not present

## 2024-05-07 DIAGNOSIS — M48062 Spinal stenosis, lumbar region with neurogenic claudication: Secondary | ICD-10-CM | POA: Diagnosis not present

## 2024-05-07 DIAGNOSIS — I119 Hypertensive heart disease without heart failure: Secondary | ICD-10-CM

## 2024-05-07 DIAGNOSIS — E782 Mixed hyperlipidemia: Secondary | ICD-10-CM

## 2024-05-07 NOTE — Progress Notes (Unsigned)
 I,Jameka J Llittleton, CMA,acting as a Neurosurgeon for Mary LOISE Slocumb, MD.,have documented all relevant documentation on the behalf of Mary LOISE Slocumb, MD,as directed by  Mary LOISE Slocumb, MD while in the presence of Mary LOISE Slocumb, MD.  Subjective:  Patient ID: Mary Krueger , female    DOB: [birth year] , 78 y.o.   MRN: [id number]  Chief Complaint  Patient presents with  . Hypertension    Patient presents today for a bp and chol check. Patient reports compliance with her meds. Patient denies having chest pain,sob or headaches at this time.    . Hyperlipidemia    HPI Discussed the use of AI scribe software for clinical note transcription with the patient, who gave verbal consent to proceed.  History of Present Illness      Hypertension This is a chronic problem. The current episode started more than 1 year ago. The problem has been gradually improving since onset. The problem is controlled. Associated symptoms include anxiety. Pertinent negatives include no blurred vision, chest pain, palpitations or shortness of breath. Past treatments include beta blockers. The current treatment provides moderate improvement.     Past Medical History:  Diagnosis Date  . Allergy   . Annual physical exam 11/08/2023  . Anxiety    Chronic  . Arthritis    generalized  . Breast mass, left 2005   fatty on U/S  . Cataract    bilateral sx  . Fibroid 1987   TAH/USO  . GERD (gastroesophageal reflux disease)   . Glaucoma    on meds  . Hyperlipidemia    diet controlled  . Uses hearing aid    Bilateral   . Vitamin D  deficiency      Family History  Problem Relation Age of Onset  . Diabetes Mother   . Anxiety disorder Mother   . Depression Mother   . Hearing loss Mother   . Colon polyps Father 87  . Prostate cancer Father   . Colon cancer Father   . Alcohol abuse Father   . Cancer Father   . Diabetes Brother   . Prostate cancer Brother   . Cancer Brother   . COPD Sister   . Esophageal  cancer Neg Hx   . Stomach cancer Neg Hx   . Rectal cancer Neg Hx      Current Outpatient Medications:  .  Evolocumab  (REPATHA  SURECLICK) 140 MG/ML SOAJ, Inject 140 mg into the skin every 14 (fourteen) days., Disp: 6 mL, Rfl: 3 .  latanoprost  (XALATAN ) 0.005 % ophthalmic solution, Place 1 drop into both eyes at bedtime., Disp: , Rfl:  .  PARoxetine  (PAXIL ) 30 MG tablet, Take 1 tablet (30 mg total) by mouth daily., Disp: 90 tablet, Rfl: 1 .  VITAMIN D  PO, Take 1 tablet by mouth daily at 6 (six) AM., Disp: , Rfl:    Allergies  Allergen Reactions  . Paxlovid [Nirmatrelvir-Ritonavir] Diarrhea  . Simvastatin Other (See Comments)    myalgias  . Atorvastatin Other (See Comments)    myalgias  . Rosuvastatin Other (See Comments)    myalgias     Review of Systems  Constitutional: Negative.   Eyes:  Negative for blurred vision.  Respiratory: Negative.  Negative for shortness of breath.   Cardiovascular: Negative.  Negative for chest pain and palpitations.  Gastrointestinal: Negative.   Neurological: Negative.   Psychiatric/Behavioral: Negative.       Today's Vitals   05/07/24 1049  BP: 126/78  Pulse: 77  Temp:  98 F (36.7 C)  TempSrc: Oral  Weight: 140 lb (63.5 kg)  Height: 5' 4 (1.626 m)  PainSc: 0-No pain   Body mass index is 24.03 kg/m.  Wt Readings from Last 3 Encounters:  05/07/24 140 lb (63.5 kg)  04/04/24 146 lb 6.4 oz (66.4 kg)  02/16/24 145 lb 12.8 oz (66.1 kg)    The ASCVD Risk score (Arnett DK, et al., 2019) failed to calculate for the following reasons:   The 2019 ASCVD risk score is only valid for ages 31 to 64  Objective:  Physical Exam Vitals and nursing note reviewed.  Constitutional:      Appearance: Normal appearance.  HENT:     Head: Normocephalic and atraumatic.  Eyes:     Extraocular Movements: Extraocular movements intact.  Cardiovascular:     Rate and Rhythm: Normal rate and regular rhythm.     Heart sounds: Normal heart sounds.   Pulmonary:     Effort: Pulmonary effort is normal.     Breath sounds: Normal breath sounds.  Musculoskeletal:     Cervical back: Normal range of motion.  Skin:    General: Skin is warm.  Neurological:     General: No focal deficit present.     Mental Status: She is alert.  Psychiatric:        Mood and Affect: Mood normal.        Behavior: Behavior normal.         Assessment And Plan:  Mixed hyperlipidemia -     CBC -     CMP14+EGFR -     Lipid panel  Hypertensive heart disease without heart failure  Peripheral arterial disease (HCC)  Polyarthralgia -     ANA, IFA (with reflex) -     CYCLIC CITRUL PEPTIDE ANTIBODY, IGG/IGA -     Rheumatoid factor -     Sedimentation rate -     Uric acid  Spinal stenosis of lumbar region with neurogenic claudication   Assessment & Plan        Return if symptoms worsen or fail to improve.  Patient was given opportunity to ask questions. Patient verbalized understanding of the plan and was able to repeat key elements of the plan. All questions were answered to their satisfaction.    I, Mary LOISE Slocumb, MD, have reviewed all documentation for this visit. The documentation on 05/07/24 for the exam, diagnosis, procedures, and orders are all accurate and complete.   IF YOU HAVE BEEN REFERRED TO A SPECIALIST, IT MAY TAKE 1-2 WEEKS TO SCHEDULE/PROCESS THE REFERRAL. IF YOU HAVE NOT HEARD FROM US /SPECIALIST IN TWO WEEKS, PLEASE GIVE US  A CALL AT [phone].

## 2024-05-07 NOTE — Patient Instructions (Signed)
 Hypertension, Adult Hypertension is another name for high blood pressure. High blood pressure forces your heart to work harder to pump blood. This can cause problems over time. There are two numbers in a blood pressure reading. There is a top number (systolic) over a bottom number (diastolic). It is best to have a blood pressure that is below 120/80. What are the causes? The cause of this condition is not known. Some other conditions can lead to high blood pressure. What increases the risk? Some lifestyle factors can make you more likely to develop high blood pressure: Smoking. Not getting enough exercise or physical activity. Being overweight. Having too much fat, sugar, calories, or salt (sodium) in your diet. Drinking too much alcohol. Other risk factors include: Having any of these conditions: Heart disease. Diabetes. High cholesterol. Kidney disease. Obstructive sleep apnea. Having a family history of high blood pressure and high cholesterol. Age. The risk increases with age. Stress. What are the signs or symptoms? High blood pressure may not cause symptoms. Very high blood pressure (hypertensive crisis) may cause: Headache. Fast or uneven heartbeats (palpitations). Shortness of breath. Nosebleed. Vomiting or feeling like you may vomit (nauseous). Changes in how you see. Very bad chest pain. Feeling dizzy. Seizures. How is this treated? This condition is treated by making healthy lifestyle changes, such as: Eating healthy foods. Exercising more. Drinking less alcohol. Your doctor may prescribe medicine if lifestyle changes do not help enough and if: Your top number is above 130. Your bottom number is above 80. Your personal target blood pressure may vary. Follow these instructions at home: Eating and drinking  If told, follow the DASH eating plan. To follow this plan: Fill one half of your plate at each meal with fruits and vegetables. Fill one fourth of your plate  at each meal with whole grains. Whole grains include whole-wheat pasta, brown rice, and whole-grain bread. Eat or drink low-fat dairy products, such as skim milk or low-fat yogurt. Fill one fourth of your plate at each meal with low-fat (lean) proteins. Low-fat proteins include fish, chicken without skin, eggs, beans, and tofu. Avoid fatty meat, cured and processed meat, or chicken with skin. Avoid pre-made or processed food. Limit the amount of salt in your diet to less than 1,500 mg each day. Do not drink alcohol if: Your doctor tells you not to drink. You are pregnant, may be pregnant, or are planning to become pregnant. If you drink alcohol: Limit how much you have to: 0-1 drink a day for women. 0-2 drinks a day for men. Know how much alcohol is in your drink. In the [place] one drink equals one 12 oz bottle of beer (355 mL), one 5 oz glass of wine (148 mL), or one 1 oz glass of hard liquor (44 mL). Lifestyle  Work with your doctor to stay at a healthy weight or to lose weight. Ask your doctor what the best weight is for you. Get at least 30 minutes of exercise that causes your heart to beat faster (aerobic exercise) most days of the week. This may include walking, swimming, or biking. Get at least 30 minutes of exercise that strengthens your muscles (resistance exercise) at least 3 days a week. This may include lifting weights or doing Pilates. Do not smoke or use any products that contain nicotine or tobacco. If you need help quitting, ask your doctor. Check your blood pressure at home as told by your doctor. Keep all follow-up visits. Medicines Take over-the-counter and prescription medicines  only as told by your doctor. Follow directions carefully. Do not skip doses of blood pressure medicine. The medicine does not work as well if you skip doses. Skipping doses also puts you at risk for problems. Ask your doctor about side effects or reactions to medicines that you should watch  for. Contact a doctor if: You think you are having a reaction to the medicine you are taking. You have headaches that keep coming back. You feel dizzy. You have swelling in your ankles. You have trouble with your vision. Get help right away if: You get a very bad headache. You start to feel mixed up (confused). You feel weak or numb. You feel faint. You have very bad pain in your: Chest. Belly (abdomen). You vomit more than once. You have trouble breathing. These symptoms may be an emergency. Get help right away. Call 911. Do not wait to see if the symptoms will go away. Do not drive yourself to the hospital. Summary Hypertension is another name for high blood pressure. High blood pressure forces your heart to work harder to pump blood. For most people, a normal blood pressure is less than 120/80. Making healthy choices can help lower blood pressure. If your blood pressure does not get lower with healthy choices, you may need to take medicine. This information is not intended to replace advice given to you by your health care provider. Make sure you discuss any questions you have with your health care provider. Document Revised: 07/09/2021 Document Reviewed: 07/09/2021 Elsevier Patient Education  2024 [employer].

## 2024-05-08 LAB — CMP14+EGFR
ALT: 14 IU/L (ref 0–32)
AST: 17 IU/L (ref 0–40)
Albumin: 4.3 g/dL (ref 3.8–4.8)
Alkaline Phosphatase: 70 IU/L (ref 44–121)
BUN/Creatinine Ratio: 26 (ref 12–28)
BUN: 22 mg/dL (ref 8–27)
Bilirubin Total: <0.2 mg/dL (ref 0.0–1.2)
CO2: 24 mmol/L (ref 20–29)
Calcium: 9.4 mg/dL (ref 8.7–10.3)
Chloride: 103 mmol/L (ref 96–106)
Creatinine, Ser: 0.84 mg/dL (ref 0.57–1.00)
Globulin, Total: 2.5 g/dL (ref 1.5–4.5)
Glucose: 94 mg/dL (ref 70–99)
Potassium: 5.5 mmol/L — ABNORMAL HIGH (ref 3.5–5.2)
Sodium: 141 mmol/L (ref 134–144)
Total Protein: 6.8 g/dL (ref 6.0–8.5)
eGFR: 70 mL/min/1.73 (ref >59)

## 2024-05-08 LAB — LIPID PANEL
Chol/HDL Ratio: 4.4 ratio (ref 0.0–4.4)
Cholesterol, Total: 270 mg/dL — ABNORMAL HIGH (ref 100–199)
HDL: 61 mg/dL (ref >39)
LDL Chol Calc (NIH): 180 mg/dL — ABNORMAL HIGH (ref 0–99)
Triglycerides: 160 mg/dL — ABNORMAL HIGH (ref 0–149)
VLDL Cholesterol Cal: 29 mg/dL (ref 5–40)

## 2024-05-08 LAB — CBC
Hematocrit: 43.9 % (ref 34.0–46.6)
Hemoglobin: 14.3 g/dL (ref 11.1–15.9)
MCH: 31.2 pg (ref 26.6–33.0)
MCHC: 32.6 g/dL (ref 31.5–35.7)
MCV: 96 fL (ref 79–97)
Platelets: 345 x10E3/uL (ref 150–450)
RBC: 4.58 x10E6/uL (ref 3.77–5.28)
RDW: 11.9 % (ref 11.7–15.4)
WBC: 6.1 x10E3/uL (ref 3.4–10.8)

## 2024-05-08 LAB — SEDIMENTATION RATE: Sed Rate: 6 mm/h (ref 0–40)

## 2024-05-08 LAB — CYCLIC CITRUL PEPTIDE ANTIBODY, IGG/IGA: Cyclic Citrullin Peptide Ab: 7 U (ref 0–19)

## 2024-05-08 LAB — RHEUMATOID FACTOR: Rheumatoid fact SerPl-aCnc: <10 [IU]/mL (ref <14.0)

## 2024-05-08 LAB — ANTINUCLEAR ANTIBODIES, IFA: ANA Titer 1: NEGATIVE

## 2024-05-08 LAB — URIC ACID: Uric Acid: 5.3 mg/dL (ref 3.1–7.9)

## 2024-05-09 ENCOUNTER — Encounter: Payer: Self-pay | Admitting: Internal Medicine

## 2024-05-09 DIAGNOSIS — R197 Diarrhea, unspecified: Secondary | ICD-10-CM | POA: Insufficient documentation

## 2024-05-09 DIAGNOSIS — M48062 Spinal stenosis, lumbar region with neurogenic claudication: Secondary | ICD-10-CM | POA: Insufficient documentation

## 2024-05-09 NOTE — Assessment & Plan Note (Signed)
 Chronic, well controlled.  She is currently on metoprolol . Feels well. No issues. Encouraged to follow low sodium diet.

## 2024-05-09 NOTE — Assessment & Plan Note (Signed)
 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 Improved post-Repatha  discontinuation. Regular exercise and dietary changes beneficial. - Encourage walking a couple of days a week and swimming three nights a week. - Advise cutting out sweets to reduce inflammation. - she has had improvement with PT

## 2024-05-09 NOTE — Assessment & Plan Note (Signed)
 Chronic, has elevated lipids. Unfortunately, she is statin intolerant. LDL goal is less than 70. She is encouraged to continue with a regular exercise regimen.

## 2024-05-09 NOTE — Assessment & Plan Note (Signed)
 Diarrhea likely from lactose intolerance due to increased cottage cheese intake. Symptoms improved with cessation. - Advise against daily consumption of cottage cheese. - Recommend Lactaid pills on days when consuming cottage cheese.

## 2024-05-09 NOTE — Assessment & Plan Note (Signed)
 Repatha  discontinued due to musculoskeletal pain. Previously tolerated Praluent . - Coordinate with PharmD team to switch to Praluent . - Inform cardiologist Reche about discontinuation of Repatha .

## 2024-05-14 ENCOUNTER — Ambulatory Visit: Payer: Self-pay | Admitting: Internal Medicine

## 2024-05-17 ENCOUNTER — Other Ambulatory Visit: Payer: Self-pay | Admitting: Internal Medicine

## 2024-05-21 ENCOUNTER — Other Ambulatory Visit: Payer: Self-pay | Admitting: Internal Medicine

## 2024-05-29 ENCOUNTER — Telehealth (HOSPITAL_BASED_OUTPATIENT_CLINIC_OR_DEPARTMENT_OTHER): Payer: Self-pay | Admitting: Cardiovascular Disease

## 2024-05-29 ENCOUNTER — Ambulatory Visit: Payer: Self-pay | Admitting: *Deleted

## 2024-05-29 ENCOUNTER — Other Ambulatory Visit: Payer: Self-pay | Admitting: Internal Medicine

## 2024-05-29 MED ORDER — METOPROLOL SUCCINATE ER 25 MG PO TB24: 25.0000 mg | ORAL_TABLET | Freq: Every day | ORAL | 1 refills | Status: AC

## 2024-05-29 NOTE — Telephone Encounter (Addendum)
 FYI Only or Action Required?: FYI only for provider. Recommended patient contact cardiologist   Patient was last seen in primary care on 05/07/2024 by Jarold Medici, MD.  Called Nurse Triage reporting: medication problem/ heart problem   Symptoms began a week ago.  Interventions attempted: Other: none .  Symptoms are: stable.  Triage Disposition: Call PCP Now  Patient/caregiver understands and will follow disposition?: Yes            Copied from CRM #[id number]. Topic: Clinical - Red Word Triage >> May 29, 2024  1:52 PM Antwanette L wrote: Red Word that prompted transfer to Nurse Triage: Pt is experiencing nervousness and fast pulse. The pt was taking Metoprolol  Succinate 25 mg. The pt has been off of the meds for 15 days and Dr. Jarold will not refill it Reason for Disposition  [1] Caller has URGENT medicine question about med that primary care doctor (or NP/PA) or specialist prescribed AND [2] triager unable to answer question  Answer Assessment - Initial Assessment Questions Patient requesting is she should be taking medications metoprolol  25 mg and keeps getting refills for repatha  which she has stopped due to side effects. Patient reports PCP will not refill metoprolol . Patient denies sx of fast heart rate now and no chest pain no difficulty breathing . Nervousness comes and goes. Recommended patient to contact cardiologist and attempted to transfer patient to office. Gave # to patient prior to transfer. [facility] cardiologist office aware of patient request. Call disconnected during transfer.   Called patient back to complete triage call and patient reports she was able to speak with cardiologist office and a nurse will be calling back for f/u regarding medication questions and f/u.  Recommended if sx worsen call back as needed to PCP office.    1. NAME of MEDICINE: What medicine(s) are you calling about?     Metoprolol  succinate 25 mg and repatha  2. QUESTION: What is your  question? (e.g., double dose of medicine, side effect)     Should I be taking metoprolol  ? PCP will not refill medication. 3. PRESCRIBER: Who prescribed the medicine? Reason: if prescribed by specialist, call should be referred to that group.     Raford, MD cardiologist 4. SYMPTOMS: Do you have any symptoms? If Yes, ask: What symptoms are you having?  How bad are the symptoms (e.g., mild, moderate, severe)     Fast heart rate at times denies now. Nervousness .  5. PREGNANCY:  Is there any chance that you are pregnant? When was your last menstrual period?     na  Protocols used: Medication Question Call-A-AH

## 2024-05-29 NOTE — Telephone Encounter (Signed)
 Spoke with patient who stated she has been taking the Toprol  25 mg daily  When she was seen by Dr Joane 5/6 was removed from her list  When she saw Dr Jarold 8/4 she was taking.   Refilled as requested   She is not taking Repatha  secondary to side effects, removed from list and cancelled at [employer]

## 2024-05-29 NOTE — Telephone Encounter (Signed)
 Ok to refill the metoprolol .    TCR

## 2024-05-29 NOTE — Telephone Encounter (Signed)
 Pt c/o medication issue:  1. Name of Medication: Metoprolol   2. How are you currently taking this medication (dosage and times per day)?   3. Are you having a reaction (difficulty breathing--STAT)?   4. What is your medication issue?    Caller Stoney) stated patient has been without this medication for 15 days and stated patient wants to be back on this medication.  Caller stated patient will call back regarding next steps.

## 2024-06-26 ENCOUNTER — Ambulatory Visit: Admitting: Dermatology

## 2024-06-26 VITALS — BP 125/69

## 2024-06-26 DIAGNOSIS — L409 Psoriasis, unspecified: Secondary | ICD-10-CM

## 2024-06-26 DIAGNOSIS — L408 Other psoriasis: Secondary | ICD-10-CM

## 2024-06-26 NOTE — Patient Instructions (Signed)
 VISIT SUMMARY:  Today, you had a follow-up appointment to discuss your psoriasis, which primarily affects your arms and nails. We reviewed your current treatment and discussed options to better manage your symptoms.  YOUR PLAN:  -PSORIASIS WITH NAIL INVOLVEMENT: Psoriasis is a chronic skin condition that causes red, scaly patches, often on the arms and nails. We discussed that your inconsistent use of clobetasol  may be contributing to your symptoms. You should apply clobetasol  twice daily for two weeks on, then two weeks off, until the lesions clear. If clobetasol  is too expensive, you can switch to triamcinolone . If topical treatments are not effective after six months, we may consider an oral medication called Otezla. Additionally, using La Roche-Posay urea moisturizing and exfoliating lotion during the winter and getting some sun exposure can help improve your skin. We will also monitor your joint pain and consider oral medication if it worsens.  INSTRUCTIONS:  Please follow the treatment plan for your psoriasis as discussed. Apply clobetasol  twice daily for two weeks on, then two weeks off, until the lesions clear. If clobetasol  is too expensive, switch to triamcinolone . Use La Roche-Posay urea moisturizing and exfoliating lotion during the winter and try to get some sun exposure. If your joint pain worsens, let us  know so we can consider oral medication. We will reassess your condition in six months to determine if an oral medication like Otezla is needed.   Important Information  Due to recent changes in healthcare laws, you may see results of your pathology and/or laboratory studies on MyChart before the doctors have had a chance to review them. We understand that in some cases there may be results that are confusing or concerning to you. Please understand that not all results are received at the same time and often the doctors may need to interpret multiple results in order to provide you with  the best plan of care or course of treatment. Therefore, we ask that you please give us  2 business days to thoroughly review all your results before contacting the office for clarification. Should we see a critical lab result, you will be contacted sooner.   If You Need Anything After Your Visit  If you have any questions or concerns for your doctor, please call our main line at [phone] If no one answers, please leave a voicemail as directed and we will return your call as soon as possible. Messages left after 4 pm will be answered the following business day.   You may also send us  a message via MyChart. We typically respond to MyChart messages within 1-2 business days.  For prescription refills, please ask your pharmacy to contact our office. Our fax number is [phone].  If you have an urgent issue when the clinic is closed that cannot wait until the next business day, you can page your doctor at the number below.    Please note that while we do our best to be available for urgent issues outside of office hours, we are not available 24/7.   If you have an urgent issue and are unable to reach us , you may choose to seek medical care at your doctor's office, retail clinic, urgent care center, or emergency room.  If you have a medical emergency, please immediately call 911 or go to the emergency department. In the event of inclement weather, please call our main line at [phone] for an update on the status of any delays or closures.  Dermatology Medication Tips: Please keep the boxes that topical  medications come in in order to help keep track of the instructions about where and how to use these. Pharmacies typically print the medication instructions only on the boxes and not directly on the medication tubes.   If your medication is too expensive, please contact our office at [phone] or send us  a message through MyChart.   We are unable to tell what your co-pay for medications  will be in advance as this is different depending on your insurance coverage. However, we may be able to find a substitute medication at lower cost or fill out paperwork to get insurance to cover a needed medication.   If a prior authorization is required to get your medication covered by your insurance company, please allow us  1-2 business days to complete this process.  Drug prices often vary depending on where the prescription is filled and some pharmacies may offer cheaper prices.  The website www.goodrx.com contains coupons for medications through different pharmacies. The prices here do not account for what the cost may be with help from insurance (it may be cheaper with your insurance), but the website can give you the price if you did not use any insurance.  - You can print the associated coupon and take it with your prescription to the pharmacy.  - You may also stop by our office during regular business hours and pick up a GoodRx coupon card.  - If you need your prescription sent electronically to a different pharmacy, notify our office through [facility] or by phone at [phone]

## 2024-06-26 NOTE — Progress Notes (Signed)
   Follow-Up Visit   Subjective  Mary Krueger is a 78 y.o. female who presents for the following: Psoriasis follow up of arms and legs. It is clear and has not flared. She is not using clobetasol . She does have a few spots on her arms. She is not sure if they are the same so she did not use the clobetasol .   The following portions of the chart were reviewed this encounter and updated as appropriate: medications, allergies, medical history  Review of Systems:  No other skin or systemic complaints except as noted in HPI or Assessment and Plan.  Objective  Well appearing patient in no apparent distress; mood and affect are within normal limits.   A focused examination was performed of the following areas: Legs   Relevant exam findings are noted in the Assessment and Plan.    Assessment & Plan   Psoriasis with nail involvement Psoriasis affects arms and nails with chronic flare-ups. Inconsistent clobetasol  use contributes to symptoms. Joint pain requires monitoring. Topical treatments preferred initially; Otezla discussed as an oral option if needed.  - Prescribe clobetasol  twice daily for two weeks on, two weeks off, until lesions clear. - Switch to triamcinolone  if clobetasol  is too expensive. - Consider Otezla if topical treatment is ineffective after six months. - Advise use of La Roche-Posay urea moisturizing and exfoliating lotion during winter. - Encourage sun exposure to help clear skin lesions. - Monitor for joint pain and consider oral medication if it worsens.     No follow-ups on file.  I, Roseline Hutchinson, CMA, am acting as scribe for [employer], DO .   Documentation: I have reviewed the above documentation for accuracy and completeness, and I agree with the above.  Delon Lenis, DO

## 2024-06-27 ENCOUNTER — Encounter: Payer: Self-pay | Admitting: Dermatology

## 2024-07-02 ENCOUNTER — Encounter: Payer: Self-pay | Admitting: Internal Medicine

## 2024-09-12 ENCOUNTER — Encounter (HOSPITAL_BASED_OUTPATIENT_CLINIC_OR_DEPARTMENT_OTHER): Payer: Self-pay | Admitting: Cardiovascular Disease

## 2024-10-31 ENCOUNTER — Ambulatory Visit: Admitting: Gastroenterology

## 2024-10-31 ENCOUNTER — Encounter: Payer: Self-pay | Admitting: Gastroenterology

## 2024-10-31 VITALS — BP 136/62 | HR 96 | Ht 64.0 in | Wt 145.0 lb

## 2024-10-31 DIAGNOSIS — R197 Diarrhea, unspecified: Secondary | ICD-10-CM

## 2024-10-31 DIAGNOSIS — R195 Other fecal abnormalities: Secondary | ICD-10-CM | POA: Insufficient documentation

## 2024-10-31 DIAGNOSIS — S39011A Strain of muscle, fascia and tendon of abdomen, initial encounter: Secondary | ICD-10-CM | POA: Diagnosis not present

## 2024-10-31 DIAGNOSIS — R140 Abdominal distension (gaseous): Secondary | ICD-10-CM | POA: Diagnosis not present

## 2024-10-31 DIAGNOSIS — R143 Flatulence: Secondary | ICD-10-CM | POA: Insufficient documentation

## 2024-10-31 NOTE — Progress Notes (Signed)
 "    10/31/2024 Mary Krueger [id number] 09-Mar-1946   Discussed the use of AI scribe software for clinical note transcription with the patient, who gave verbal consent to proceed.  History of Present Illness Mary Krueger is an 79 year old female who presents with six months of persistent bloating, gas, and loose stools.  She is a patient of Dr. Trenna.  For the past six months, she has experienced daily bloating and increased gas, along with loose stools occurring primarily once each morning. Stools are loose but not profuse, and she denies any prior similar symptoms. Bloating and gas are present daily, and after lunch she sometimes feels as though she might explode. She avoids peppery or hot foods, as these worsen her symptoms. No blood has been seen in her stool. There is no clear association of symptoms with specific foods, and she does not experience nocturnal bowel movements.  Approximately six months ago, she began a new exercise program at the [facility] and changed her diet to include cottage cheese with blueberries. She does not use artificial sweeteners, magnesium supplements, diet pills, or new medications. Artificial sweeteners were discontinued about five months ago per her physician's recommendation. She drinks mostly water, occasionally half and half, and does not use supplements for sleep or muscle cramps.  She describes a sensation of straining or pulling in her lower abdomen, especially when she feels the need to urinate. She does not get up at night to have bowel movements, only to urinate. During a viral illness a couple of months ago, she developed localized abdominal pain, which she attributes to coughing and possibly pulling a muscle. The abdominal discomfort has persisted since that time, but is not described as severe pain.  Colonoscopy in 2024 and CT scan of the abdomen and pelvis in November 2024 were reviewed. Blood work from August 2024 was normal. Recent  gynecological exam reportedly did not reveal any abnormalities according to the patient.  Colonoscopy June 2024 reviewed.  Past Medical History:  Diagnosis Date   Allergy    Annual physical exam 11/08/2023   Anxiety    Chronic   Arthritis    generalized   Breast mass, left 2005   fatty on U/S   Cataract    bilateral sx   Fibroid 1987   TAH/USO   GERD (gastroesophageal reflux disease)    Glaucoma    on meds   Hyperlipidemia    diet controlled   Uses hearing aid    Bilateral    Vitamin D  deficiency    Past Surgical History:  Procedure Laterality Date   ABDOMINAL HYSTERECTOMY  1989   BALLOON DILATION N/A 03/05/2019   Procedure: BALLOON DILATION;  Surgeon: Shila Gustav GAILS, MD;  Location: [facility];  Service: Endoscopy;  Laterality: N/A;  TTS balloon dilation   CATARACT EXTRACTION W/ INTRAOCULAR LENS IMPLANT Bilateral 12/2015 01/2016   COSMETIC SURGERY     Mini face lift   ESOPHAGEAL MANOMETRY N/A 03/05/2019   Procedure: ESOPHAGEAL MANOMETRY (EM);  Surgeon: Shila Gustav GAILS, MD;  Location: [facility];  Service: Endoscopy;  Laterality: N/A;   ESOPHAGOGASTRODUODENOSCOPY  01/2019   ESOPHAGOGASTRODUODENOSCOPY (EGD) WITH PROPOFOL  N/A 01/17/2019   Procedure: ESOPHAGOGASTRODUODENOSCOPY (EGD) WITH PROPOFOL ;  Surgeon: Albertus Gordy HERO, MD;  Location[facility] END[facility]COPY;  Service: Gastroenterology;  Laterality: N/A;   ESOPHAGOGASTRODUODENOSCOPY (EGD) WITH PROPOFOL  N/A 03/05/2019   Procedure: ESOPHAGOGASTRODUODENOSCOPY (EGD) WITH PROPOFOL ;  Surgeon: Shila Gustav GAILS, MD;  Location: [facility];  Service: Endoscopy;  Laterality: N/A;  EYE SURGERY  06/2012   eye lift   FOREIGN BODY REMOVAL  01/17/2019   Procedure: FOREIGN BODY REMOVAL;  Surgeon: Albertus Gordy HERO, MD;  Location: [facility];  Service: Gastroenterology;;   LIPOMA EXCISION Left 2009   left arm   STOMACH SURGERY  1997   Sphincter   TONSILLECTOMY AND ADENOIDECTOMY  1972   TOTAL ABDOMINAL HYSTERECTOMY W/ BILATERAL  SALPINGOOPHORECTOMY  1989   TRIGGER FINGER RELEASE     right    reports that she quit smoking about 45 years ago. Her smoking use included cigarettes. She started smoking about 63 years ago. She has a 27 pack-year smoking history. She has never been exposed to tobacco smoke. She has never used smokeless tobacco. She reports that she does not drink alcohol and does not use drugs. family history includes Alcohol abuse in her father; Anxiety disorder in her mother; COPD in her sister; Cancer in her brother and father; Colon cancer in her father; Colon polyps (age of onset: 45) in her father; Depression in her mother; Diabetes in her brother and mother; Hearing loss in her mother; Prostate cancer in her brother and father. Allergies[1]    Outpatient Encounter Medications as of 10/31/2024  Medication Sig   latanoprost  (XALATAN ) 0.005 % ophthalmic solution Place 1 drop into both eyes at bedtime.   metoprolol  succinate (TOPROL  XL) 25 MG 24 hr tablet Take 1 tablet (25 mg total) by mouth daily.   PARoxetine  (PAXIL ) 30 MG tablet Take 1 tablet (30 mg total) by mouth daily.   VITAMIN D  PO Take 1 tablet by mouth daily at 6 (six) AM.   No facility-administered encounter medications on file as of 10/31/2024.    REVIEW OF SYSTEMS  : All other systems reviewed and negative except where noted in the History of Present Illness.   PHYSICAL EXAM: BP 136/62   Pulse 96   Ht 5' 4 (1.626 m)   Wt 145 lb (65.8 kg)   LMP 10/04/1984   SpO2 99%   BMI 24.89 kg/m  General: Well developed white female in no acute distress Head: Normocephalic and atraumatic Eyes:  Sclerae anicteric, conjunctiva pink. Ears: Normal auditory acuity Lungs: Clear throughout to auscultation; no W/R/R. Heart: Regular rate and rhythm; no M/R/G. Abdomen: Soft, non-distended.  BS present.  Mild localized right lower quadrant tenderness. Musculoskeletal: Symmetrical with no gross deformities  Skin: No lesions on visible  extremities Neurological: Alert oriented x 4, grossly non-focal Psychological:  Alert and cooperative. 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 Discussed that SIBO may recur. - Provided SIBO breath test kit for at-home testing with instructions to follow dietary restrictions prior to testing and to send the completed kit for processing. - Plan to review results upon return; if abnormal, will initiate gut-specific antibiotics. - Discussed option for retreatment if symptoms recur after successful treatment.  Abdominal wall muscle strain Localized abdominal pain following recent viral illness with coughing is consistent with benign abdominal wall muscle strain, expected to persist for months but self-limited. - Provided reassurance regarding benign nature and expected duration of muscle strain.   CC:  Jarold Medici, MD        [1]  Allergies Allergen Reactions   Paxlovid [Nirmatrelvir-Ritonavir] Diarrhea   Simvastatin Other (See Comments)    myalgias   Atorvastatin Other (See Comments)  myalgias   Rosuvastatin Other (See Comments)    myalgias   "

## 2024-10-31 NOTE — Patient Instructions (Signed)
 You have been given a testing kit to check for small intestine bacterial overgrowth (SIBO) which is completed by a company named [employer]. Make sure to return your test in the mail using the return mailing label given to you along with the kit. The test order, your demographic and insurance information have all already been sent to the company. [facility] will collect an upfront charge of $109.00 for commercial insurance plans and $229.00 if you are paying cash. The potential remaining total after claim submission and review is $120.00. Make sure to discuss with [facility] PRIOR to having the test to see if they have gotten information from your insurance company as to how much your testing will cost out of pocket, if any. Please contact [facility] at phone number [phone] to get instructions regarding how to perform the test as our office is unable to give specific testing instructions.   _______________________________________________________  If your blood pressure at your visit was 140/90 or greater, please contact your primary care physician to follow up on this.  _______________________________________________________  If you are age 2 or older, your body mass index should be between 23-30. Your Body mass index is 24.89 kg/m. If this is out of the aforementioned range listed, please consider follow up with your Primary Care Provider.  If you are age 92 or younger, your body mass index should be between 19-25. Your Body mass index is 24.89 kg/m. If this is out of the aformentioned range listed, please consider follow up with your Primary Care Provider.   ________________________________________________________  The [facility] providers would like to encourage you to use MYCHART to communicate with providers for non-urgent requests or questions.  Due to long hold times on the telephone, sending your provider a message by [facility] may be a faster and more efficient way  to get a response.  Please allow 48 business hours for a response.  Please remember that this is for non-urgent requests.  _______________________________________________________  [facility] is using a team-based approach to care.  Your team is made up of your doctor and two to three APPS. Our APPS (Nurse Practitioners and Physician Assistants) work with your physician to ensure care continuity for you. They are fully qualified to address your health concerns and develop a treatment plan. They communicate directly with your gastroenterologist to care for you. Seeing the Advanced Practice Practitioners on your physician's team can help you by facilitating care more promptly, often allowing for earlier appointments, access to diagnostic testing, procedures, and other specialty referrals.

## 2024-11-12 ENCOUNTER — Encounter: Payer: Self-pay | Admitting: Internal Medicine

## 2024-12-25 ENCOUNTER — Ambulatory Visit: Admitting: Dermatology

## 2025-05-08 ENCOUNTER — Ambulatory Visit: Payer: Self-pay
